# Patient Record
Sex: Female | Born: 1985 | Race: Black or African American | Hispanic: No | Marital: Single | Smoking: Never smoker
Health system: Southern US, Community
[De-identification: ages and names within clinical notes are randomized; demographics above are authoritative.]

## PROBLEM LIST (undated history)

## (undated) DIAGNOSIS — I2699 Other pulmonary embolism without acute cor pulmonale: Secondary | ICD-10-CM

## (undated) DIAGNOSIS — F329 Major depressive disorder, single episode, unspecified: Secondary | ICD-10-CM

## (undated) DIAGNOSIS — G2119 Other drug induced secondary parkinsonism: Secondary | ICD-10-CM

## (undated) DIAGNOSIS — F209 Schizophrenia, unspecified: Secondary | ICD-10-CM

## (undated) DIAGNOSIS — G8190 Hemiplegia, unspecified affecting unspecified side: Secondary | ICD-10-CM

## (undated) DIAGNOSIS — E538 Deficiency of other specified B group vitamins: Secondary | ICD-10-CM

## (undated) DIAGNOSIS — F32A Depression, unspecified: Secondary | ICD-10-CM

## (undated) DIAGNOSIS — H2010 Chronic iridocyclitis, unspecified eye: Secondary | ICD-10-CM

## (undated) DIAGNOSIS — K219 Gastro-esophageal reflux disease without esophagitis: Secondary | ICD-10-CM

## (undated) DIAGNOSIS — G809 Cerebral palsy, unspecified: Secondary | ICD-10-CM

## (undated) DIAGNOSIS — M1990 Unspecified osteoarthritis, unspecified site: Secondary | ICD-10-CM

## (undated) DIAGNOSIS — H409 Unspecified glaucoma: Secondary | ICD-10-CM

## (undated) HISTORY — PX: CSF SHUNT: SHX92

## (undated) HISTORY — DX: Major depressive disorder, single episode, unspecified: F32.9

## (undated) HISTORY — PX: IVC FILTER INSERTION: CATH118245

## (undated) HISTORY — DX: Schizophrenia, unspecified: F20.9

## (undated) HISTORY — DX: Depression, unspecified: F32.A

## (undated) HISTORY — DX: Cerebral palsy, unspecified: G80.9

## (undated) HISTORY — DX: Hemiplegia, unspecified affecting unspecified side: G81.90

## (undated) HISTORY — DX: Other drug induced secondary parkinsonism: G21.19

---

## 2004-11-10 ENCOUNTER — Ambulatory Visit: Payer: Self-pay | Admitting: Internal Medicine

## 2004-12-03 ENCOUNTER — Ambulatory Visit: Payer: Self-pay | Admitting: Internal Medicine

## 2006-11-07 ENCOUNTER — Other Ambulatory Visit: Payer: Self-pay

## 2006-11-07 ENCOUNTER — Emergency Department: Payer: Self-pay | Admitting: Emergency Medicine

## 2010-01-31 ENCOUNTER — Emergency Department: Payer: Self-pay | Admitting: Internal Medicine

## 2010-02-13 ENCOUNTER — Emergency Department: Payer: Self-pay | Admitting: Emergency Medicine

## 2011-08-14 ENCOUNTER — Inpatient Hospital Stay: Payer: Self-pay | Admitting: Psychiatry

## 2013-01-08 ENCOUNTER — Emergency Department: Payer: Self-pay | Admitting: Emergency Medicine

## 2013-01-08 LAB — URINALYSIS, COMPLETE
Bilirubin,UR: NEGATIVE
Glucose,UR: NEGATIVE mg/dL (ref 0–75)
Ketone: NEGATIVE
Nitrite: NEGATIVE
Ph: 6 (ref 4.5–8.0)
RBC,UR: 9 /HPF (ref 0–5)
Specific Gravity: 1.019 (ref 1.003–1.030)
WBC UR: 77 /HPF (ref 0–5)

## 2013-01-08 LAB — COMPREHENSIVE METABOLIC PANEL
Alkaline Phosphatase: 88 U/L (ref 50–136)
Anion Gap: 8 (ref 7–16)
Bilirubin,Total: 0.6 mg/dL (ref 0.2–1.0)
Calcium, Total: 9.6 mg/dL (ref 8.5–10.1)
Chloride: 108 mmol/L — ABNORMAL HIGH (ref 98–107)
Creatinine: 0.66 mg/dL (ref 0.60–1.30)
EGFR (African American): >60
EGFR (Non-African Amer.): >60
Osmolality: 275 (ref 275–301)
Potassium: 4 mmol/L (ref 3.5–5.1)
SGPT (ALT): 19 U/L (ref 12–78)
Total Protein: 9.1 g/dL — ABNORMAL HIGH (ref 6.4–8.2)

## 2013-01-08 LAB — CBC
HGB: 14.1 g/dL (ref 12.0–16.0)
MCH: 28.6 pg (ref 26.0–34.0)
MCHC: 33.6 g/dL (ref 32.0–36.0)
MCV: 85 fL (ref 80–100)
RBC: 4.95 10*6/uL (ref 3.80–5.20)
RDW: 13.8 % (ref 11.5–14.5)
WBC: 10.1 10*3/uL (ref 3.6–11.0)

## 2013-06-13 ENCOUNTER — Ambulatory Visit: Payer: Self-pay | Admitting: Neurology

## 2013-07-08 LAB — COMPREHENSIVE METABOLIC PANEL
Alkaline Phosphatase: 78 U/L (ref 50–136)
BUN: 5 mg/dL — ABNORMAL LOW (ref 7–18)
Calcium, Total: 9 mg/dL (ref 8.5–10.1)
Chloride: 106 mmol/L (ref 98–107)
Creatinine: 0.72 mg/dL (ref 0.60–1.30)
Potassium: 3.5 mmol/L (ref 3.5–5.1)
SGPT (ALT): 14 U/L (ref 12–78)
Total Protein: 8.1 g/dL (ref 6.4–8.2)

## 2013-07-08 LAB — CBC
HGB: 13.1 g/dL (ref 12.0–16.0)
MCH: 28.4 pg (ref 26.0–34.0)
MCV: 85 fL (ref 80–100)
Platelet: 217 10*3/uL (ref 150–440)
RBC: 4.61 10*6/uL (ref 3.80–5.20)
RDW: 13.6 % (ref 11.5–14.5)
WBC: 13.1 10*3/uL — ABNORMAL HIGH (ref 3.6–11.0)

## 2013-07-08 LAB — LIPASE, BLOOD: Lipase: 117 U/L (ref 73–393)

## 2013-07-09 ENCOUNTER — Inpatient Hospital Stay: Payer: Self-pay | Admitting: Internal Medicine

## 2013-07-09 LAB — URINALYSIS, COMPLETE
Bilirubin,UR: NEGATIVE
Blood: NEGATIVE
Glucose,UR: NEGATIVE mg/dL (ref 0–75)
Leukocyte Esterase: NEGATIVE
Ph: 6 (ref 4.5–8.0)
Protein: 30
Specific Gravity: 1.027 (ref 1.003–1.030)
Squamous Epithelial: <1
WBC UR: 3 /HPF (ref 0–5)

## 2013-07-09 LAB — PROTIME-INR: Prothrombin Time: 14.9 secs — ABNORMAL HIGH (ref 11.5–14.7)

## 2013-07-09 LAB — APTT: Activated PTT: 32.9 secs (ref 23.6–35.9)

## 2013-07-10 LAB — CBC WITH DIFFERENTIAL/PLATELET
Basophil %: 0.5 %
Eosinophil %: 0.5 %
HCT: 34.8 % — ABNORMAL LOW (ref 35.0–47.0)
HGB: 12.2 g/dL (ref 12.0–16.0)
Lymphocyte %: 19.5 %
MCHC: 34.9 g/dL (ref 32.0–36.0)
Monocyte %: 10 %
Neutrophil #: 6.8 10*3/uL — ABNORMAL HIGH (ref 1.4–6.5)
Platelet: 190 10*3/uL (ref 150–440)
RBC: 4.19 10*6/uL (ref 3.80–5.20)
WBC: 9.8 10*3/uL (ref 3.6–11.0)

## 2013-07-10 LAB — BASIC METABOLIC PANEL
Anion Gap: 7 (ref 7–16)
BUN: 2 mg/dL — ABNORMAL LOW (ref 7–18)
Calcium, Total: 9 mg/dL (ref 8.5–10.1)
Co2: 24 mmol/L (ref 21–32)
EGFR (African American): >60
Sodium: 138 mmol/L (ref 136–145)

## 2013-07-10 LAB — APTT: Activated PTT: 67.9 secs — ABNORMAL HIGH (ref 23.6–35.9)

## 2013-07-10 LAB — PROTIME-INR
INR: 1.3
Prothrombin Time: 16.5 secs — ABNORMAL HIGH (ref 11.5–14.7)

## 2013-07-11 LAB — CBC WITH DIFFERENTIAL/PLATELET
Basophil #: 0.1 10*3/uL (ref 0.0–0.1)
Eosinophil #: 0 10*3/uL (ref 0.0–0.7)
Eosinophil %: 0.4 %
HGB: 12.6 g/dL (ref 12.0–16.0)
Lymphocyte #: 1.9 10*3/uL (ref 1.0–3.6)
MCH: 28.9 pg (ref 26.0–34.0)
MCHC: 34.2 g/dL (ref 32.0–36.0)
MCV: 85 fL (ref 80–100)
Neutrophil #: 8.6 10*3/uL — ABNORMAL HIGH (ref 1.4–6.5)
Platelet: 228 10*3/uL (ref 150–440)

## 2013-07-11 LAB — APTT
Activated PTT: 57.3 secs — ABNORMAL HIGH (ref 23.6–35.9)
Activated PTT: 80.3 secs — ABNORMAL HIGH (ref 23.6–35.9)

## 2013-07-11 LAB — PROTIME-INR
INR: 1.9
Prothrombin Time: 21.3 secs — ABNORMAL HIGH (ref 11.5–14.7)

## 2013-07-12 ENCOUNTER — Ambulatory Visit: Payer: Self-pay | Admitting: Internal Medicine

## 2013-07-12 LAB — BASIC METABOLIC PANEL
Anion Gap: 6 — ABNORMAL LOW (ref 7–16)
Chloride: 102 mmol/L (ref 98–107)
Co2: 27 mmol/L (ref 21–32)
EGFR (Non-African Amer.): >60
Glucose: 94 mg/dL (ref 65–99)
Osmolality: 266 (ref 275–301)
Potassium: 3.6 mmol/L (ref 3.5–5.1)
Sodium: 135 mmol/L — ABNORMAL LOW (ref 136–145)

## 2013-07-12 LAB — CBC WITH DIFFERENTIAL/PLATELET
Basophil #: 0.1 10*3/uL (ref 0.0–0.1)
Basophil #: 0 10*3/uL (ref 0.0–0.1)
Basophil %: 0.7 %
Eosinophil #: 0 10*3/uL (ref 0.0–0.7)
HCT: 35.6 % (ref 35.0–47.0)
HGB: 12.5 g/dL (ref 12.0–16.0)
Lymphocyte #: 1.6 10*3/uL (ref 1.0–3.6)
Lymphocyte #: 1.2 10*3/uL (ref 1.0–3.6)
MCH: 29.1 pg (ref 26.0–34.0)
MCHC: 34.6 g/dL (ref 32.0–36.0)
MCHC: 35.1 g/dL (ref 32.0–36.0)
MCV: 83 fL (ref 80–100)
Monocyte #: 1.1 x10 3/mm — ABNORMAL HIGH (ref 0.2–0.9)
Monocyte %: 11.3 %
Neutrophil #: 7.2 10*3/uL — ABNORMAL HIGH (ref 1.4–6.5)
Neutrophil %: 75.7 %
Platelet: 272 10*3/uL (ref 150–440)
RBC: 4.3 10*6/uL (ref 3.80–5.20)
RDW: 13.8 % (ref 11.5–14.5)
WBC: 9.9 10*3/uL (ref 3.6–11.0)

## 2013-07-12 LAB — APTT
Activated PTT: 85.3 secs — ABNORMAL HIGH (ref 23.6–35.9)
Activated PTT: 47.8 secs — ABNORMAL HIGH (ref 23.6–35.9)
Activated PTT: 72.5 secs — ABNORMAL HIGH (ref 23.6–35.9)

## 2013-07-12 LAB — PROTIME-INR
INR: 2.7
Prothrombin Time: 29.5 secs — ABNORMAL HIGH (ref 11.5–14.7)

## 2013-07-13 LAB — CBC WITH DIFFERENTIAL/PLATELET
Basophil %: 1 %
Eosinophil #: 0.1 10*3/uL (ref 0.0–0.7)
Eosinophil %: 1.2 %
HCT: 36.1 % (ref 35.0–47.0)
HGB: 12.7 g/dL (ref 12.0–16.0)
Lymphocyte #: 2.6 10*3/uL (ref 1.0–3.6)
Lymphocyte %: 29.4 %
MCH: 29 pg (ref 26.0–34.0)
Monocyte #: 1.1 x10 3/mm — ABNORMAL HIGH (ref 0.2–0.9)
Monocyte %: 12 %
Neutrophil #: 4.9 10*3/uL (ref 1.4–6.5)
Neutrophil %: 56.4 %
RBC: 4.37 10*6/uL (ref 3.80–5.20)
RDW: 13.3 % (ref 11.5–14.5)
WBC: 8.8 10*3/uL (ref 3.6–11.0)

## 2013-07-13 LAB — PROTIME-INR
INR: 2.5
Prothrombin Time: 30.8 secs — ABNORMAL HIGH (ref 11.5–14.7)
Prothrombin Time: 26.7 secs — ABNORMAL HIGH (ref 11.5–14.7)

## 2013-07-14 ENCOUNTER — Other Ambulatory Visit: Payer: Self-pay | Admitting: Internal Medicine

## 2013-07-14 LAB — PROTIME-INR
INR: 3.5
Prothrombin Time: 33.7 secs — ABNORMAL HIGH (ref 11.5–14.7)

## 2013-07-16 ENCOUNTER — Ambulatory Visit: Payer: Self-pay | Admitting: Internal Medicine

## 2013-07-16 LAB — PROTIME-INR
INR: 1.9
Prothrombin Time: 21.7 secs — ABNORMAL HIGH (ref 11.5–14.7)

## 2013-07-20 LAB — PROTIME-INR: INR: 2.4

## 2013-07-22 ENCOUNTER — Other Ambulatory Visit: Payer: Self-pay | Admitting: Internal Medicine

## 2013-07-22 LAB — PROTIME-INR
INR: 2.1
Prothrombin Time: 23.5 secs — ABNORMAL HIGH (ref 11.5–14.7)

## 2013-07-23 ENCOUNTER — Ambulatory Visit: Payer: Self-pay | Admitting: Internal Medicine

## 2013-07-24 LAB — PROTIME-INR: INR: 2.1

## 2013-07-30 LAB — PROTIME-INR
INR: 2.2
Prothrombin Time: 24.1 secs — ABNORMAL HIGH (ref 11.5–14.7)

## 2013-08-06 LAB — PROTIME-INR: INR: 1.7

## 2013-08-07 LAB — CA 125: CA 125: 22.6 U/mL (ref 0.0–34.0)

## 2013-08-07 LAB — CEA: CEA: 1.6 ng/mL (ref 0.0–4.7)

## 2013-08-20 LAB — PROTIME-INR: INR: 2.2

## 2013-08-22 ENCOUNTER — Ambulatory Visit: Payer: Self-pay | Admitting: Internal Medicine

## 2013-08-29 LAB — PROTIME-INR: Prothrombin Time: 16.2 secs — ABNORMAL HIGH (ref 11.5–14.7)

## 2013-09-12 LAB — PROTIME-INR
INR: 1.9
Prothrombin Time: 21.4 secs — ABNORMAL HIGH (ref 11.5–14.7)

## 2013-09-22 ENCOUNTER — Ambulatory Visit: Payer: Self-pay | Admitting: Internal Medicine

## 2013-09-26 LAB — PROTIME-INR: Prothrombin Time: 20.2 secs — ABNORMAL HIGH (ref 11.5–14.7)

## 2013-10-03 LAB — PROTIME-INR
INR: 1.7
Prothrombin Time: 20.2 secs — ABNORMAL HIGH (ref 11.5–14.7)

## 2013-10-08 LAB — PROTIME-INR
INR: 2
Prothrombin Time: 22.3 secs — ABNORMAL HIGH (ref 11.5–14.7)

## 2013-10-15 LAB — PROTIME-INR: INR: 1.8

## 2013-10-22 ENCOUNTER — Ambulatory Visit: Payer: Self-pay | Admitting: Internal Medicine

## 2013-10-25 ENCOUNTER — Ambulatory Visit: Payer: Self-pay | Admitting: Internal Medicine

## 2013-10-25 LAB — PROTIME-INR: Prothrombin Time: 19.3 secs — ABNORMAL HIGH (ref 11.5–14.7)

## 2013-11-05 LAB — PROTIME-INR: Prothrombin Time: 26.9 secs — ABNORMAL HIGH (ref 11.5–14.7)

## 2013-11-12 LAB — PROTIME-INR
INR: 2.7
Prothrombin Time: 27.6 secs — ABNORMAL HIGH (ref 11.5–14.7)

## 2013-11-22 ENCOUNTER — Ambulatory Visit: Payer: Self-pay | Admitting: Internal Medicine

## 2013-11-27 LAB — PROTIME-INR
INR: 1.4
Prothrombin Time: 16.6 secs — ABNORMAL HIGH (ref 11.5–14.7)

## 2013-12-05 LAB — PROTIME-INR
INR: 1.7
Prothrombin Time: 19.3 secs — ABNORMAL HIGH (ref 11.5–14.7)

## 2013-12-10 LAB — PROTIME-INR
INR: 1.3
Prothrombin Time: 16.3 secs — ABNORMAL HIGH (ref 11.5–14.7)

## 2013-12-17 LAB — PROTIME-INR
INR: 1.7
Prothrombin Time: 19.6 secs — ABNORMAL HIGH (ref 11.5–14.7)

## 2013-12-18 LAB — PROT IMMUNOELECTROPHORES(ARMC)

## 2013-12-23 ENCOUNTER — Ambulatory Visit: Payer: Self-pay | Admitting: Internal Medicine

## 2013-12-31 LAB — URINALYSIS, COMPLETE
Bilirubin,UR: NEGATIVE
Blood: NEGATIVE
GLUCOSE, UR: NEGATIVE mg/dL (ref 0–75)
KETONE: NEGATIVE
Nitrite: NEGATIVE
PH: 6 (ref 4.5–8.0)
Protein: NEGATIVE
RBC,UR: 1 /HPF (ref 0–5)
Specific Gravity: 1.025 (ref 1.003–1.030)
Squamous Epithelial: 1
WBC UR: 23 /HPF (ref 0–5)

## 2013-12-31 LAB — PROTIME-INR
INR: 1.6
Prothrombin Time: 18.5 secs — ABNORMAL HIGH (ref 11.5–14.7)

## 2014-01-14 ENCOUNTER — Ambulatory Visit: Payer: Self-pay | Admitting: Neurology

## 2014-01-14 LAB — HCG, QUANTITATIVE, PREGNANCY: Beta Hcg, Quant.: <1 m[IU]/mL — ABNORMAL LOW

## 2014-01-20 ENCOUNTER — Ambulatory Visit: Payer: Self-pay | Admitting: Internal Medicine

## 2014-02-20 ENCOUNTER — Ambulatory Visit: Payer: Self-pay | Admitting: Internal Medicine

## 2014-02-25 LAB — D-DIMER(ARMC): D-DIMER: 624 ng/mL

## 2014-03-22 ENCOUNTER — Ambulatory Visit: Payer: Self-pay | Admitting: Internal Medicine

## 2015-01-03 ENCOUNTER — Emergency Department: Payer: Self-pay | Admitting: Emergency Medicine

## 2015-01-25 ENCOUNTER — Emergency Department: Payer: Self-pay | Admitting: Emergency Medicine

## 2015-01-28 ENCOUNTER — Emergency Department: Payer: Self-pay | Admitting: Emergency Medicine

## 2015-03-14 NOTE — Discharge Summary (Signed)
PATIENT NAME:  Hannah Kennedy, Hannah Kennedy MR#:  [id number] DATE OF BIRTH:  September 15, 1986  DATE OF ADMISSION:  07/09/2013 DATE OF DISCHARGE:  07/13/2013  ADMITTING DIAGNOSES:  1. Abdominal pain.  2. Tachycardia.   DISCHARGE DIAGNOSES:  1. Abdominal pain, felt to be due to likely pleurisy from bilateral pulmonary embolism.  2. Bilateral pulmonary embolism, possibly related to hypercoagulable state as well as limited mobility.  3. Possible uterine mass noted on CT scan. Negative ultrasound for any mass. Status post gynecology evaluation.  4. Decrease in protein C level as well as antithrombin antigen level, status post hematology evaluation and followup.   CONSULTANTS: Dr. Vena AustriaAndreas Staebler as well as Dr. Lorre NickGittin.  PERTINENT LABORATORY EVALUATIONS: INR was 1.2 on admission. Lipase was 117. Glucose 132, BUN 5, creatinine 0.72, sodium 136, potassium 3.5, chloride 106, CO2 was 24, calcium was 9, bilirubin total 0.4, alkaline phosphatase was 78, ALT was 14, AST was 14, total protein 8.1, albumin 3.8. WBC 13.1, hemoglobin 13.1, platelet count was 217. EKG showed sinus tachycardia. CT of the abdomen and pelvis without contrast showed bilateral pulmonary emboli with filling defects. Right pelvic enhancing mass. Ultrasound of the bilateral lower extremities was negative. Antithrombin III antigen was less than 70%. Factor V Leiden mutation was no mutation found. Pelvic ultrasound showed no evidence of any mass. No lupus anticoagulant detected. The patient's INR today is 2.5.   HOSPITAL COURSE: Please refer to H and P done by the admitting physician. The patient is a 29 year old female with a history of multiple sclerosis, has mild mental retardation, history of hydrocephalus and cerebral palsy, who was brought into the ED with abdominal pain. The patient underwent evaluation for her abdominal pain. She was also tachycardic on presentation. She had a CT scan of the abdomen and pelvis done which showed bilateral lower lobe  pulmonary emboli were noted on the CT scan. Therefore, the patient was admitted for bilateral pulmonary emboli. The likely cause of her abdominal pain was pleuritic pain from these PEs. The patient was started on heparin drip. She also had a Doppler of her lower extremity, which was negative. The patient was also concurrently started on Coumadin. The patient was noted to have a low protein C level as well as antithrombin antigen level. The patient was continued on Coumadin until her INR became therapeutic. The patient was on heparin for 5 days, and for 2 days, her INR stayed above 2. She was seen in consultation by Dr. Lorre NickGittin, who agreed with her plan, and he recommended the patient can be discharged and be followed up as an outpatient. At this time, she is doing much better and is stable for discharge.   DISCHARGE MEDICATIONS:  1. Acetaminophen/oxycodone 325/5 q.8 p.r.n. 2. Coumadin 4 mg daily. 3. Colace 100 mg 1 tab p.o. b.i.Kennedy. p.r.n. constipation.   DIET: Regular.   ACTIVITY: As tolerated.   FOLLOWUP:  1. [facility] at 1:30 p.m. for INR check.  2. The patient to follow up with Dr. Lorre NickGittin as scheduled.   TIME SPENT: Note, 35 minutes spent on the discharge.   ____________________________ Lacie ScottsShreyang H. Allena KatzPatel, MD shp:OSi Kennedy: 07/14/2013 08:29:08 ET T: 07/14/2013 08:50:55 ET JOB#: [id number]  cc: Kasiyah Platter H. Allena KatzPatel, MD, <Dictator> Charise CarwinSHREYANG H Keiarah Orlowski MD ELECTRONICALLY SIGNED 07/17/2013 18:23

## 2015-03-14 NOTE — Consult Note (Signed)
Brief Consult Note: Diagnosis: pulm embolism.   Patient was seen by consultant.   Comments: DICTATED NOTE TO FOLLOW, PATIENT SEEN, CHART REVIEWED LAST NIGHT, F/U THIS PM, HX AS NOTED ABDO PAIN NOW ATTRIBUTED TO PE/PLEURISY, DOPPLERS LEGS NEG, NO PRECIPITATING EVENT, PATIENT TAKES DEPO PROVERA..SOME HYPERCOAG STUDIES SENT,.. AT3 LOW WHICH MAY BE SPURRIOUS DUE TO ACUTE CLOT,  PROT C IS LOW WHICH MAY BE REAL AND THE CAAUSE OF CLOT, LUPUS PANEL AND OTHER STUDIES PENDING..CT AND PELVIC U/S NEGATIVE FOR CLOT.   PLAN REVIEW LUPUS PANEL, IF NEGATIVE ADD CARDIOLIPIN AND GLYCOPROTEIN ANTIBODIES, CHECK FACTOR 5 LIDEN AND PROTHROMBIN MUTATION, CHECK U/A TO R/O PROTEINURIA.  RE ANTICOAGULATION, COUMADIN STARTED 8/18, AND GIVEN 8/19 AND 8/20.Marland Kitchen.Marland Kitchen.INR UP , BUT PARTIALLY DUE TO HEPARIN EFFECT. NEED TO BE ON COUMADIN FOR 5 DAYS BEFORE HEPSRIN CAN BE STOPPED,  IF INR IS THERAPEUTIC THEN OVERLAP COUMADIN AND HEPARIN, IF INR IS SUPRATHERAPEUTIC, HOLD HEPARIN X  4 HRS AND RECHECK, KEEP HEPARIN IN NLOWER THERAPEUTIC RANGE. I WILL F/U LATER TODAY,  MAY GIVE COUMADIN LATER TODAY, I WILL F/U IN AM.  DISCUSSED WITH DR PATEL, EXPLAINED TO PATIENT AND FAMILY...DICTATED NOTE TO FOLLOW.  Electronic Signatures: Marin RobertsGittin, Robert G (MD)  (Signed 21-Aug-14 14:08)  Authored: Brief Consult Note   Last Updated: 21-Aug-14 14:08 by Marin RobertsGittin, Robert G (MD)

## 2015-03-14 NOTE — Consult Note (Signed)
Chief Complaint:  Subjective/Chief Complaint no sob no acute complaints   VITAL SIGNS/ANCILLARY NOTES:  **Vital Signs.:   22-Aug-14 04:09  Vital Signs Type Routine  Pulse Pulse 112  Respirations Respirations 16  Systolic BP Systolic BP 106  Diastolic BP (mmHg) Diastolic BP (mmHg) 64  Mean BP 78  Pulse Ox % Pulse Ox % 96  Pulse Ox Activity Level  At rest  Oxygen Delivery Room Air/ 21 %    15:38  Vital Signs Type Routine  Temperature Temperature (F) 98.2  Celsius 36.7  Temperature Source oral  Pulse Pulse 114  Respirations Respirations 20  Systolic BP Systolic BP 105  Diastolic BP (mmHg) Diastolic BP (mmHg) 73  Mean BP 83  Pulse Ox % Pulse Ox % 99  Pulse Ox Activity Level  At rest  Oxygen Delivery Room Air/ 21 %   Brief Assessment:  Additional Physical Exam alert, cooperative, no bruising, old right arm and leg weakness no change   Lab Results:  Routine Coag:  22-Aug-14 04:10   Prothrombin  30.8  INR 3.1 (INR reference interval applies to patients on anticoagulant therapy. A single INR therapeutic range for coumarins is not optimal for all indications; however, the suggested range for most indications is 2.0 - 3.0. Exceptions to the INR Reference Range may include: Prosthetic heart valves, acute myocardial infarction, prevention of myocardial infarction, and combinations of aspirin and anticoagulant. The need for a higher or lower target INR must be assessed individually. Reference: The Pharmacology and Management of the Vitamin K  antagonists: the seventh ACCP Conference on Antithrombotic and Thrombolytic Therapy. Chest.2004 Sept:126 (3suppl): L7870634. A HCT value >55% may artifactually increase the PT.  In one study,  the increase was an average of 25%. Reference:  "Effect on Routine and Special Coagulation Testing Values of Citrate Anticoagulant Adjustment in Patients with High HCT Values." American Journal of Clinical Pathology 2006;126:400-405.)  Activated  PTT (APTT)  99.5 (A HCT value >55% may artifactually increase the APTT. In one study, the increase was an average of 19%. Reference: "Effect on Routine and Special Coagulation Testing Values of Citrate Anticoagulant Adjustment in Patients with High HCT Values." American Journal of Clinical Pathology 2006;126:400-405.)    13:20   Prothrombin  26.7  INR 2.5 (INR reference interval applies to patients on anticoagulant therapy. A single INR therapeutic range for coumarins is not optimal for all indications; however, the suggested range for most indications is 2.0 - 3.0. Exceptions to the INR Reference Range may include: Prosthetic heart valves, acute myocardial infarction, prevention of myocardial infarction, and combinations of aspirin and anticoagulant. The need for a higher or lower target INR must be assessed individually. Reference: The Pharmacology and Management of the Vitamin K  antagonists: the seventh ACCP Conference on Antithrombotic and Thrombolytic Therapy. Chest.2004 Sept:126 (3suppl): L7870634. A HCT value >55% may artifactually increase the PT.  In one study,  the increase was an average of 25%. Reference:  "Effect on Routine and Special Coagulation Testing Values of Citrate Anticoagulant Adjustment in Patients with High HCT Values." American Journal of Clinical Pathology 2006;126:400-405.)  Routine Hem:  22-Aug-14 04:10   WBC (CBC) 8.8  RBC (CBC) 4.37  Hemoglobin (CBC) 12.7  Hematocrit (CBC) 36.1  Platelet Count (CBC) 279  MCV 83  MCH 29.0  MCHC 35.1  RDW 13.3  Neutrophil % 56.4  Lymphocyte % 29.4  Monocyte % 12.0  Eosinophil % 1.2  Basophil % 1.0  Neutrophil # 4.9  Lymphocyte # 2.6  Monocyte #  1.1  Eosinophil # 0.1  Basophil # 0.1 (Result(s) reported on 13 Jul 2013 at 04:47AM.)   Assessment/Plan:  Assessment/Plan:  Assessment PTT UP,  INR 3.1     PLAN  OK TO STOP HEPARIN DRIP NOW, WILL RECHECK INR IN 4 HRS OFF HEPARIN, WILL DOSE TODAYS COUMADIN  ACCORDINGLY, THE PLANNED 2MG  LOOKS LLIKE IT WILL BE THE BEST DOSE...LIKELY WILL BE OK TO GO HOME LATER TODAY, WITH PROPHYLACTIC DOSE 30MG  LOVENOX PRIOR TO D/C, AND ARRANGEMENTS FOR PROTIME CHECK [facility] OVER WEEKEND, ONCOLOGY MD ON CALL CAN CHECK AND TITRATE COUMADIN....I WILL F/U THIS AFTERNOON.......addendum, f/u now 1630 hrs...repeat protime off heparin is 2.5.   ok for 4 mg coumadin po today and daily.  will give subcut lovenox prophylactic dose x 1 prior to discharge.  patient arranged for out patient protime tomorrow DR PANDIT to review and adjust prn,  then f/u in cancer center 230 pm monday 8/25   Electronic Signatures: Marin RobertsGittin, Robert G (MD)  (Signed 22-Aug-14 16:23)  Authored: Chief Complaint, VITAL SIGNS/ANCILLARY NOTES, Brief Assessment, Lab Results, Assessment/Plan   Last Updated: 22-Aug-14 16:23 by Marin RobertsGittin, Robert G (MD)

## 2015-03-14 NOTE — Consult Note (Signed)
PATIENT NAME:  Hannah Kennedy, Hannah Kennedy MR#:  [id number] DATE OF BIRTH:  [birth year]  HEMATOLOGY CONSULTATION  DATE OF CONSULTATION:  07/12/2013  HISTORY OF PRESENT ILLNESS: Ms Clementeen GrahamCurry is a 29 year old patient who was admitted on August 18th and was seen and evaluated by me on August 21st. A note was placed in the chart on that day. Discussed all the issues with the family members. This full narrative was delayed until the current day.   The patient was admitted, see also admission history and physical for details, with a few days of abdominal pain that radiated to the lower abdomen without any fever or other accompanying symptoms. 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 There was some shortness of breath and the pain waxed and waned, later was attributed to pleurisy.   Pelvic ultrasound with transvaginal was done and there was no adnexal or uterine pathology. The patient was put on pain medicines and oxygen initially. Lower extremity Dopplers were performed, and they were negative. She was on a heparin drip and Coumadin was started. She was given GI prophylaxis with Protonix.   Hematology was consulted and I saw the patient on August 21st.  It was at that point the patient was doing well. Abdominal pain was minimal. Workup to-date had been performed,  antithrombin levels have been shown to be low, as likely spurious, protein C level was shown to be low which may be the cause of the clot. Other studies, including lupus panel, were pending. As already stated,  CT of the abdomen and pelvis had already been performed. Coumadin had been started on August 18th, and the INR was already fairly high after only 3 doses. Some of this was due to a heparin effect.   PAST MEDICAL HISTORY: Cerebral palsy, with mild mental retardation and some psychosis, hydrocephalus as a child, seizures as a child, question of  valvular heart disease. Had a VP shunt as a neonate.   SOCIAL HISTORY: Negative for alcohol or tobacco. The patient lives with her grandmother.   FAMILY HISTORY: Negative for heart disease or blood clots.   ALLERGIES: No known allergies.   MEDICATIONS: Had been on intramuscular Invega for psychosis, and intramuscular Depo-Provera every 3 months.   REVIEW OF SYSTEMS: When I saw the patient, she was comfortable. Was still having fleeting abdominal pain and some shortness of breath, but had no fever, chills, headache or dizziness. No ear or jaw pain. No stomatitis. No cough, wheezing, hemoptysis. No palpitations or retrosternal pain. No nausea, vomiting, diarrhea, history of blood in stools. No dysuria or hematuria or polyuria, polydipsia. No history of anemia, easy bleeding or bruising. No numbness or tingling of the extremities. There is a history of weakness, right arm greater than right leg, and chronically the patient walks with a limp. There is a history of psychosis. No history of depression.   PHYSICAL EXAMINATION: GENERAL: The patient was alert and cooperative in no acute distress. There was no jaundice. HEENT: Sclerae were clear.  LYMPH: No lymph nodes in the neck. No palpable nodes in the supraclavicular, submandibular, or axilla.  ABDOMEN: Abdomen was not tender to my exam.  HEART: Was regular. There was no extremity edema. There was some contraction of the right arm and at the right wrist, and there was focal weakness in the right arm and to a lesser extent the right leg, which  is per patient and the family members, which is old and stable. The patient was alert and cooperative.   LABS/STUDIES: Creatinine was 0.72. Liver functions were normal. The white count was  13.1, hemoglobin was 13.1. The platelets were 217, and baseline INR was 1.2.   IMPRESSION AND PLAN: The patient, as stated, had a questionable uterine mass that initially, on ultrasound, was benign, and this history of  psychosis, which is stable, and underlying neurologic issues, as reviewed, currently stable. Pulmonary embolism without any lower extremity edema, without any precipitating cause. Initial workup had shown a low protein C could be the cause.   PLAN: Complete hypercoagulable workup and later recheck the protein C. Monitor and titrate the Coumadin and make sure the Coumadin is over level of heparin for at least 5 days. Later, adjust Coumadin as an outpatient and later address the length of anticoagulation, depending on the workup.  \ Stopped Depo-Provera and need to later address birth control issues with primary care or gynecology.   Would follow up with CBC. Later would add tumor markers as a screening. Otherwise, no other age-appropriate cancer screening in this patient.   Followup in the Cancer Center at the time of discharge. I had planned to follow daily while the patient was in the hospital.      ____________________________ Knute Neu. Lorre Nick, MD rgg:dm Kennedy: 07/19/2013 13:38:00 ET T: 07/19/2013 14:15:02 ET JOB#: [id number]  cc: Knute Neu. Lorre Nick, MD, <Dictator> Marin Roberts MD ELECTRONICALLY SIGNED 08/22/2013 11:22

## 2015-03-14 NOTE — H&P (Signed)
PATIENT NAME:  Hannah Kennedy, Hannah Kennedy MR#:  [id number] DATE OF BIRTH:  [birth year]  DATE OF ADMISSION:  07/09/2013  REFERRING PHYSICIAN: Lurline Hare, MD  PRIMARY CARE PHYSICIAN: [facility]   CHIEF COMPLAINT: Abdominal pain.   HISTORY OF PRESENT ILLNESS: This is a 29 year old female with significant past medical history of multiple sclerosis not on any treatment currently, mild mental retardation, hydrocephalus, cerebral palsy and psychosis who presents with complaints of abdominal pain. The patient reports her symptoms have been going on for the last day. Reports it is in the right lower quadrant area, sharp, moderate, constant. She denies any fever, chills, nausea, vomiting, diarrhea, sweating or loss of appetite. The patient had a CT of abdomen and pelvis with IV contrast for evaluation. The patient was found to have pelvic mass, in the uterus, most likely leiomyoma, but as well in the findings the patient was found to have bilateral lower lobe pulmonary embolism with possible right lobe developing infarction, in the right lung base. The patient denies any shortness of breath. Reports cough but denies any hemoptysis.  Denies any chest pain or any pleuritic chest pain. As well, denies any recent history of flying or prolonged travel. Reports she ambulatory. Denies any lower extremity erythema, pain or swelling. Denies any hemoptysis. The patient was noticed to be tachycardic, but did not have any hypoxia.  The patient was started on heparin drip in the ED. The patient reports she is using IM Depo every 3 months for contraception. As well, she is using IM Invega, cycles every 4 weeks, where she is due on this coming Tuesday. As well, she denies any family history of any blood clots in the family. Hospitalist service was requested to admit the patient for further evaluation of uterine mass. As well, for further management of her PE.   PAST MEDICAL HISTORY: 1.  Multiple sclerosis.  2.  Mild mental  retardation. 3.  Hydrocephalus.  4.  Leaky valves.  5.  Seizures as a child.  5.  Psychosis.  6.  Cerebral palsy.   PAST SURGICAL HISTORY: VP shunt as neonate.   SOCIAL HISTORY: The patient lives with her grandmother. No history of smoking, illicit drug use or alcohol.   FAMILY HISTORY: No family history of coronary artery disease at a young age. No family history of any hypercoagulable disease or blood clots.   ALLERGIES: No known drug allergies.  HOME MEDICATIONS: The patient is on: 1.  IM Depo every 3 months for contraception. Does not recall which type exactly.  2.  The patient is on IM Invega Sustenna 234 mg IM monthly. She is due next Tuesday.  REVIEW OF SYSTEMS: CONSTITUTIONAL: Denies fever, chills, fatigue, weakness, weight gain, weight loss.  EYES: Denies blurry vision, double vision, pain, inflammation, glaucoma.  ENT: Denies tinnitus, ear pain, hearing loss, epistaxis or discharge.  RESPIRATORY: Denies wheezing, hemoptysis, dyspnea, painful respirations, COPD.  Complains of dry cough.  CARDIOVASCULAR: Denies chest pain, edema, palpitations, syncope.  GASTROINTESTINAL: Denies nausea, vomiting, diarrhea, hematemesis, melena. Complains of abdominal pain.  GENITOURINARY: Denies dysuria, hematuria, renal colic.  ENDOCRINE: Denies polyuria, polydipsia, heat or cold intolerance.  HEMATOLOGY: Denies any anemia, easy bruising, bleeding diathesis. INTEGUMENTARY: Denies acne, rash or skin lesions. MUSCULOSKELETAL: Denies any arthritis, cramps, swelling, gout.  NEUROLOGIC: Denies any history of numbness, tremors, vertigo, migraine headache. Has history of seizures as a child.  PSYCHIATRIC: Denies anxiety, insomnia, bipolar disorder. Has history of psychosis.   PHYSICAL EXAMINATION: VITAL SIGNS: Temperature 98.5, pulse 113,  respiratory rate 24, blood pressure 126/77, saturating 95% on room air.  GENERAL: Young female who lies comfortable, in no apparent distress.  HEENT: Head  atraumatic, normocephalic. Pupils equal and reactive to light. Pink conjunctivae. Anicteric sclerae. Moist oral mucosa. Has acne on the face.  NECK: Supple. No thyromegaly. No JVD. No carotid bruits.  CHEST: Good air entry bilaterally. No wheezing, rales or rhonchi.  CARDIOVASCULAR: S1, S2 heard. No rubs or gallops.  ABDOMEN: Soft. Has mild tenderness in the lower abdominal area but no rebound, no guarding. Bowel sounds present.  EXTREMITIES: No edema. No clubbing. No cyanosis. Dorsalis pedis pulse +2 bilaterally. Good capillary refill.  MUSCULOSKELETAL: Has no joint effusion. No tenderness. Has right upper extremity contracted.   PSYCHIATRIC: Appropriate affect. Awake and alert x 3, communicative.  NEUROLOGIC: Cranial nerves grossly intact. Motor 5 out of 5 in lower extremities and left upper extremity.  LYMPHATICS: No cervical or supraclavicular lymphadenopathy.  SKIN: Has acne on the face.   PERTINENT LABORATORY DATA: Glucose 132, BUN 5, creatinine 0.72, sodium 136, potassium 3.5, chloride 106, CO2 24, ALT 14, AST 14, alk phos 78. Total bili 0.4. White blood cells 13.1, hemoglobin 13.1, hematocrit 39.3, platelets 217. Urinalysis negative for leukocyte esterase and nitrite. INR 1.2.   ASSESSMENT AND PLAN: 1.  Bilateral pulmonary embolism. The patient was started on heparin drip.  She is not hypoxic. Did not have any chest pain, no hematochezia.  Only sign is tachycardia. We will continue with fluids. Will continue with heparin drip. Will obtain venous Dopplers. We will check hypercoagulable workup. The patient and grandmother were instructed for her to stop the Depo contraception. At one point, if there is no further workup is indicated for the uterine mass, she can switch to p.o. anticoagulation. As well, we will continue the patient on continuous pulse ox, and we will have her on telemetry monitor, and will have her on p.r.n. oxygen to keep her O2 sat at 100%.  2.  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 We will consult OB/GYN for further recommendation if any further workup is indicated.  3.  History of psychosis. The patient is following with a psychiatrist as an outpatient. She is on Invega shots, which can be continued as an outpatient.  4.  History of multiple sclerosis and mild mental retardation. Continue with supportive care.  Does not have any worsening in motor function recently.  5.  Deep vein thrombosis prophylaxis. The patient is on full dose anticoagulation.  6.  Gastrointestinal prophylaxis. Continue with Protonix.   CODE STATUS. The patient is FULL CODE.   TOTAL TIME SPENT ON ADMISSION AND PATIENT CARE: 50 minutes. ____________________________ Albertine Patricia, MD dse:sb D: 07/09/2013 03:19:58 ET T: 07/09/2013 07:20:22 ET JOB#: [id number]  cc: Albertine Patricia, MD, <Dictator> Laneisha Mino Graciela Husbands MD ELECTRONICALLY SIGNED 07/14/2013 12:18

## 2015-08-03 ENCOUNTER — Emergency Department
Admission: EM | Admit: 2015-08-03 | Discharge: 2015-08-03 | Disposition: A | Discharge status: Home / Self Care | Payer: Medicaid Other | Attending: Emergency Medicine | Admitting: Emergency Medicine

## 2015-08-03 DIAGNOSIS — H109 Unspecified conjunctivitis: Secondary | ICD-10-CM | POA: Diagnosis not present

## 2015-08-03 DIAGNOSIS — H5712 Ocular pain, left eye: Secondary | ICD-10-CM | POA: Diagnosis present

## 2015-08-03 MED ORDER — CIPROFLOXACIN HCL 0.3 % OP SOLN
2.0000 [drp] | OPHTHALMIC | Status: DC
  Administered 2015-08-03: 2 [drp] via OPHTHALMIC

## 2015-08-03 MED ORDER — CIPROFLOXACIN HCL 0.3 % OP SOLN
OPHTHALMIC | Status: AC
  Administered 2015-08-03: 2 [drp] via OPHTHALMIC
  Filled 2015-08-03: qty 2.5

## 2015-08-03 MED ORDER — CIPROFLOXACIN HCL 0.3 % OP SOLN: 1.0000 [drp] | OPHTHALMIC | Status: AC

## 2015-08-03 NOTE — ED Provider Notes (Signed)
[facility] Emergency Department Provider Note ____________________________________________  Time seen: Approximately 11:33 AM  I have reviewed the triage vital signs and the nursing notes.   HISTORY  Chief Complaint Eye Pain   HPI Hannah Kennedy is a 29 y.o. female who presents to the emergency department for left pain and irritation.She has  a mental disability and is unable to give a good history. (Grandmother is in the waiting room and does not wish to come back to treatment area.) From what I can deduce, she awakened with eye redness, pain, and drainage.  No past medical history on file.  There are no active problems to display for this patient.   No past surgical history on file.  Current Outpatient Rx  Name  Route  Sig  Dispense  Refill  . ciprofloxacin (CILOXAN) 0.3 % ophthalmic solution   Left Eye   Place 1 drop into the left eye every 2 (two) hours. Administer 1 drop, every 2 hours, while awake, for 2 days. Then 1 drop, every 4 hours, while awake, for the next 5 days.   5 mL   0     Allergies Review of patient's allergies indicates not on file.  No family history on file.  Social History Social History  Substance Use Topics  . Smoking status: Never Smoker   . Smokeless tobacco: Not on file  . Alcohol Use: Not on file    Review of Systems   Constitutional: No fever/chills Eyes: Visual changes: unable to determine. ENT: No sore throat. Cardiovascular: Denies chest pain. Respiratory: Denies shortness of breath. Gastrointestinal: No abdominal pain.  No nausea, no vomiting.  No diarrhea. Musculoskeletal: Negative for pain. Skin: Negative for rash. Neurological: Negative for headaches, focal weakness or numbness. Psychiatric:At baseline, no complaint Lymphatic:Swollen nodes-- no Allergic: Seasonal allergies: unknown 10-point ROS otherwise negative.  ____________________________________________  PHYSICAL EXAM:  VITAL  SIGNS: ED Triage Vitals  Enc Vitals Group     BP 08/03/15 0948 110/74 mmHg     Pulse Rate 08/03/15 0948 103     Resp --      Temp 08/03/15 0948 98 F (36.7 C)     Temp Source 08/03/15 0948 Oral     SpO2 08/03/15 0948 96 %     Weight 08/03/15 0948 120 lb (54.432 kg)     Height 08/03/15 0948  (1.575 m)     Head Cir --      Peak Flow --      Pain Score 08/03/15 0949 9     Pain Loc --      Pain Edu? --      Excl. in GC? --     Constitutional: Alert and oriented. Well appearing and in no acute distress. Eyes: Visual acuity--see nursing documentation; No globe trauma; Eyelids normal to inspection; Everted for exam yes; Conjunctiva and sclera: erythematous, injected; Corneas: normal; Examined with fluorescein no; EOM's intact; Pupils PERRLA; Anterior Chambers normal with limited exam.  Head: Atraumatic. Nose: No congestion/rhinnorhea. Mouth/Throat: Mucous membranes are moist. Cardiovascular:Good peripheral circulation. Respiratory: Normal respiratory effort.  No retractions. Musculoskeletal:Normal ROM Neurologic:  Normal speech and language. No gross focal neurologic deficits are appreciated. Speech is normal. No gait instability. Skin:  Skin is warm, dry and intact. No rash noted. Psychiatric: Mood and affect are normal. Speech and behavior are normal.  ____________________________________________   LABS (all labs ordered are listed, but only abnormal results are displayed)  Labs Reviewed - No data to display ____________________________________________  EKG  ____________________________________________  RADIOLOGY  ____________________________________________   PROCEDURES  Procedure(s) performed: None  ____________________________________________   INITIAL IMPRESSION / ASSESSMENT AND PLAN / ED COURSE  Pertinent labs & imaging results that were available during my care of the patient were reviewed by me and considered in my medical decision making (see chart  for details).   Patient was advised to follow up with ophthalmology for symptoms that are not improving over the next 2 days. She was  also advised to return to the ER for symptoms that change or worsen if unable to schedule an appointment.  ____________________________________________   FINAL CLINICAL IMPRESSION(S) / ED DIAGNOSES  Final diagnoses:  Conjunctivitis of left eye       Chinita Pester, FNP 08/03/15 1141  Minna Antis, MD 08/03/15 1455

## 2015-08-03 NOTE — Discharge Instructions (Signed)
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
## 2016-11-05 ENCOUNTER — Other Ambulatory Visit: Payer: Self-pay | Admitting: Orthopedic Surgery

## 2016-11-05 DIAGNOSIS — M25551 Pain in right hip: Secondary | ICD-10-CM

## 2016-11-16 ENCOUNTER — Ambulatory Visit: Payer: Medicaid Other

## 2016-12-10 ENCOUNTER — Other Ambulatory Visit: Payer: Self-pay | Admitting: Orthopedic Surgery

## 2016-12-10 DIAGNOSIS — M25551 Pain in right hip: Secondary | ICD-10-CM

## 2016-12-10 DIAGNOSIS — G8929 Other chronic pain: Secondary | ICD-10-CM

## 2016-12-10 DIAGNOSIS — M25859 Other specified joint disorders, unspecified hip: Secondary | ICD-10-CM

## 2016-12-10 DIAGNOSIS — M7061 Trochanteric bursitis, right hip: Secondary | ICD-10-CM

## 2016-12-20 ENCOUNTER — Ambulatory Visit
Admission: RE | Admit: 2016-12-20 | Discharge: 2016-12-20 | Disposition: A | Discharge status: Home / Self Care | Payer: Medicaid Other | Source: Ambulatory Visit | Attending: Orthopedic Surgery | Admitting: Orthopedic Surgery

## 2016-12-20 DIAGNOSIS — M25551 Pain in right hip: Secondary | ICD-10-CM

## 2016-12-20 DIAGNOSIS — G8929 Other chronic pain: Secondary | ICD-10-CM

## 2016-12-20 DIAGNOSIS — M25451 Effusion, right hip: Secondary | ICD-10-CM | POA: Diagnosis not present

## 2016-12-20 DIAGNOSIS — M25859 Other specified joint disorders, unspecified hip: Secondary | ICD-10-CM

## 2016-12-20 DIAGNOSIS — M7061 Trochanteric bursitis, right hip: Secondary | ICD-10-CM

## 2016-12-20 DIAGNOSIS — M87051 Idiopathic aseptic necrosis of right femur: Secondary | ICD-10-CM | POA: Insufficient documentation

## 2016-12-28 ENCOUNTER — Encounter (INDEPENDENT_AMBULATORY_CARE_PROVIDER_SITE_OTHER): Payer: Self-pay | Admitting: Vascular Surgery

## 2017-01-04 ENCOUNTER — Encounter (INDEPENDENT_AMBULATORY_CARE_PROVIDER_SITE_OTHER): Payer: Self-pay | Admitting: Vascular Surgery

## 2017-01-04 ENCOUNTER — Ambulatory Visit (INDEPENDENT_AMBULATORY_CARE_PROVIDER_SITE_OTHER): Payer: Medicaid Other | Admitting: Vascular Surgery

## 2017-01-04 VITALS — BP 113/78 | HR 107 | Resp 15 | Ht 64.5 in | Wt 130.0 lb

## 2017-01-04 DIAGNOSIS — Z86711 Personal history of pulmonary embolism: Secondary | ICD-10-CM | POA: Diagnosis not present

## 2017-01-04 DIAGNOSIS — F209 Schizophrenia, unspecified: Secondary | ICD-10-CM | POA: Insufficient documentation

## 2017-01-04 DIAGNOSIS — G809 Cerebral palsy, unspecified: Secondary | ICD-10-CM | POA: Insufficient documentation

## 2017-01-04 DIAGNOSIS — G802 Spastic hemiplegic cerebral palsy: Secondary | ICD-10-CM | POA: Diagnosis not present

## 2017-01-04 DIAGNOSIS — M87051 Idiopathic aseptic necrosis of right femur: Secondary | ICD-10-CM | POA: Diagnosis not present

## 2017-01-04 NOTE — Assessment & Plan Note (Signed)
Given her recent history of pulmonary embolus, she would be at a markedly increased risk of perioperative thromboembolic complications. With this, I would recommend a temporary IVC filter be placed and then removed about 6-8 weeks after surgery. We will go ahead and place this next week as it sounds like her hip surgery will be sometime the following week. Risks and benefits were discussed.

## 2017-01-04 NOTE — Patient Instructions (Signed)
Inferior Vena Cava Filter Insertion Insertion of an inferior vena cava (IVC) filter is a procedure in which a filter is placed into the large vein in your abdomen that carries blood from the lower part of your body to your heart (inferior vena cava). Placement of the filter here helps prevent blood clots in the legs or pelvis from traveling to your lungs. A large blood clot in the lungs can cause death.  The filter is a small, metal device about an inch long. It is shaped like the spokes of an umbrella and is inserted through a pathway created in your neck or groin. The risks of this procedure are usually small and easily managed. Inferior vena cava filters are only used when blood thinners (anticoagulants) cannot be used to prevent blood clots from forming. This may occur because of:  You have severe platelet problems or shortage.  You have had recent or current major bleeding that cannot be treated.  You have bleeding associated with anticoagulants.  You have recurrence of blood clots while on anticoagulants.  You have a need for surgery in the near future.  You have bleeding in your head. EXPECTATIONS OF A FILTER  An IVC filter will reduce the risk of a large blood clot making its way to your lungs (pulmonary embolus, or PE). It cannot eliminate the risk completely, or prevent small PEs from occurring.  It does not stop blood clots from growing, recurring, or developing into postphlebitic syndrome. This is a condition that can occur after there is inflammation in a vein (phlebitis). LET [facility] KNOW ABOUT:  Any allergies you have. This includes an allergy to iodine or contrast dye.  All medicines you are taking, including vitamins, herbs, eye drops, creams and over-the-counter medicines.  Previous problems you or members of your family have had with the use of anesthetics.  Any blood disorders you have.  Previous surgeries you had had.  Medical conditions you  have.  Possibility of pregnancy, if this applies. RISKS AND COMPLICATIONS Generally, this is a safe procedure. However, as with any procedure, problems can occur. Possible problems include:  A small bruise around the needle insertion site. A larger pooling of blood called a hematoma may form. This is usually of no concern.  The filter can block the vena cava. This can cause some swelling of the legs.  The filter may eventually fail and not work properly.  Continued bleeding or infections (uncommon).  Damage to the vein caused by a thin, flexible tube (catheter) that is used in this procedure. This is rare. BEFORE THE PROCEDURE  Your health care provider may want you to have blood tests. These tests can help tell how well your kidneys and liver are working. They can also show how well your blood clots.  If you take blood thinners, ask your health care provider if and when you should stop taking them.  Do not eat or drink for 4 hours before the procedure or as directed by your health care provider.  Make arrangements for someone to drive you home. Depending on the procedure, you may be able to go home the same day.  PROCEDURE   Insertion of a vena cava filter is performed by a vascular surgeon or cardiologist in a cardiac catheterization lab.  The procedure usually takes about 30 minutes to 1 hour. This can vary.  An IV needle will be inserted into one of your veins. Medicine will be able to flow directly into your  body through this needle.  Medicines may given to help you relax and relieve anxiety (sedative).  The procedure is done through a large vein in your neck or groin. A germ-killing solution (antiseptic) will be used to wash the area where the catheter will be inserted. Hair may be removed from this area.  The skin and deeper tissues over the vein will be made numb with a local anesthetic. You are awake during the procedure and can let your health care providers know if you  have discomfort.  A needle is then put into the vein. A guide wire is placed through the needle and into the vein. This is used to help insert a catheter and the IVC filter into your vein.  Contrast dye may be injected into the inferior vena cava to help guide the catheter and verify precise placement of the IVC filter. X-ray equipment may also be used to verify that the catheter and the wire are in the correct position.  The wire is then withdrawn.  The IVC filter is then passed over the catheter into the vein, and inserted into the correct location in the vena cava.  The catheter is then removed. Pressure will be kept on the needle insertion point for several minutes or until it is unlikely to bleed. AFTER THE PROCEDURE  You will stay in a recovery area until any sedation medicine you were given has worn off. Your blood pressure and pulse will be checked.  If there are no problems, you should be able to go home after the procedure.  You may feel sore at the area of the needle insertion for a few days. This information is not intended to replace advice given to you by your health care provider. Make sure you discuss any questions you have with your health care provider. Document Released: 12/29/2005 Document Revised: 03/01/2016 Document Reviewed: 07/16/2013 Elsevier Interactive Patient Education  2017 [employer]

## 2017-01-04 NOTE — Assessment & Plan Note (Signed)
Quite debilitated by this at a baseline. Her hip issues on top of this become disabling.

## 2017-01-04 NOTE — Assessment & Plan Note (Signed)
This is going to require a major hip replacement. Given her recent history of pulmonary embolus, she would be at a markedly increased risk of perioperative thromboembolic complications. With this, I would recommend a temporary IVC filter be placed and then removed about 6-8 weeks after surgery. We will go ahead and place this next week as it sounds like her hip surgery will be sometime the following week. Risks and benefits were discussed.

## 2017-01-04 NOTE — Progress Notes (Signed)
Patient ID: Hannah Kennedy, female   DOB: 1986-06-20, 31 y.o.   MRN: [id number]  Chief Complaint  Patient presents with  . New Evaluation    Need total right hip arthroplasty    HPI Hannah Kennedy is a 30 y.o. female.  I am asked to see the patient by Dr. Rosita Kea for evaluation of IVC filter placement.  The patient Is a poor historian but her family with her today help with the history. She apparently had a DVT and pulmonary embolus 2-3 years ago. She has had worsening pain and immobility secondary to avascular necrosis of the right hip. This has been a progressive problem over several months resulting in clinical deterioration. She now needs a major hip replacement, and given her previous history of DVT and pulmonary embolus we were asked to place an IVC filter. She has no current chest pain or shortness of breath. She has no fever or chills.  There was no obvious trauma or injury and her previous PE was not clearly provokes.   Past Medical History:  Diagnosis Date  . Cerebral palsy (HCC)   . Depression   . Hemiparesis (HCC)   . 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 Schizophrenia (HCC)     No past surgical history on file.  Family History No known family history of bleeding disorders, clotting disorders, autoimmune diseases, or aneurysms  Social History Social History  Substance Use Topics  . Smoking status: Never Smoker  . Smokeless tobacco: Never Used  . Alcohol use No  Lives in a group home  Allergies  Allergen Reactions  . Sulfamethoxazole-Trimethoprim     Other reaction(s): Other (See Comments) Other reaction(s): GI upset    Current Outpatient Prescriptions  Medication Sig Dispense Refill  . Adalimumab (HUMIRA PEN) 40 MG/0.8ML PNKT INJECT ONE pen Subcutaneously EVERY other WEEK    . chlorproMAZINE (THORAZINE) 50 MG tablet Take 50 mg by mouth.    . fluticasone (FLONASE) 50 MCG/ACT nasal spray Place into the nose.    . folic acid (FOLVITE) 1 MG tablet Take by mouth.     Marland Kitchen ibuprofen (ADVIL,MOTRIN) 600 MG tablet Take 600 mg by mouth.    . latanoprost (XALATAN) 0.005 % ophthalmic solution Apply to eye.    . methotrexate (RHEUMATREX) 2.5 MG tablet Take 6 tablets (15 mg) every WEEK for 2 weeks. If tolerating well, increase to 8 tablets (20 mg) every WEEK    . Miconazole Nitrate (LOTRIMIN AF) 2 % AERO Apply topically.    Marland Kitchen omeprazole (PRILOSEC) 20 MG capsule Take by mouth.    . paliperidone (INVEGA SUSTENNA) 234 MG/1.5ML SUSP injection Inject into the muscle.    . Paliperidone Palmitate (INVEGA TRINZA) 410 MG/1.315ML SUSP     . predniSONE (DELTASONE) 10 MG tablet Take by mouth.    . sertraline (ZOLOFT) 50 MG tablet Take by mouth.    . timolol (TIMOPTIC) 0.25 % ophthalmic solution Apply to eye.    . traMADol (ULTRAM) 50 MG tablet TK 1 TO 2 TS PO Q 6 H PRN FOR PAIN  0  . vitamin B-12 (CYANOCOBALAMIN) 1000 MCG tablet Take by mouth.     No current facility-administered medications for this visit.       REVIEW OF SYSTEMS (Negative unless checked)  Constitutional: [] Weight loss  [] Fever  [] Chills Cardiac: [] Chest pain   [] Chest pressure   [] Palpitations   [] Shortness of breath when laying flat   [] Shortness of breath at rest   [] Shortness  of breath with exertion. Vascular:  [x] Pain in legs with walking   [x] Pain in legs at rest   [] Pain in legs when laying flat   [] Claudication   [] Pain in feet when walking  [] Pain in feet at rest  [] Pain in feet when laying flat   [x] History of DVT   [] Phlebitis   [] Swelling in legs   [] Varicose veins   [] Non-healing ulcers Pulmonary:   [] Uses home oxygen   [] Productive cough   [] Hemoptysis   [] Wheeze  [] COPD   [] Asthma Neurologic:  [] Dizziness  [] Blackouts   [] Seizures   [] History of stroke   [] History of TIA  [] Aphasia   [] Temporary blindness   [] Dysphagia   [x] Weakness or numbness in arms   [x] Weakness or numbness in legs Musculoskeletal:  [x] Arthritis   [] Joint swelling   [] Joint pain   [] Low back pain Hematologic:  [] Easy  bruising  [] Easy bleeding   [] Hypercoagulable state   [] Anemic  [] Hepatitis Gastrointestinal:  [] Blood in stool   [] Vomiting blood  [] Gastroesophageal reflux/heartburn   [] Abdominal pain Genitourinary:  [] Chronic kidney disease   [] Difficult urination  [] Frequent urination  [] Burning with urination   [] Hematuria Skin:  [] Rashes   [] Ulcers   [] Wounds Psychological:  [] History of anxiety   []  History of major depression.    Physical Exam BP 113/78 (BP Location: Left Arm)   Pulse (!) 107   Resp 15   Ht 5' 4.5" (1.638 m)   Wt 130 lb (59 kg)   BMI 21.97 kg/m  Gen:  WD/WN, NAD. Debilitated apperaing Head: [place]/AT, No temporalis wasting. Prominent temp pulse not noted. Ear/Nose/Throat: Hearing grossly intact, nares w/o erythema or drainage, oropharynx w/o Erythema/Exudate. Dentition poor Eyes: Conjunctiva clear, sclera non-icteric  Neck: trachea midline.  No JVD.  Pulmonary:  Good air movement, equal bilaterally, respirations not labored.  Cardiac: RRR, normal S1, S2 Vascular:  Vessel Right Left  Radial Palpable Palpable  Ulnar Palpable Palpable  Brachial Palpable Palpable  Carotid Palpable, without bruit Palpable, without bruit  Aorta Not palpable N/A  Femoral Palpable Palpable  Popliteal Palpable Palpable  PT Palpable Palpable  DP Palpable Palpable   Gastrointestinal: soft, non-tender/non-distended. No guarding/reflex. No masses, surgical incisions, or scars. Musculoskeletal: RIght hemiparesis appears present.  Extremities without ischemic changes.  No deformity or atrophy. 1+ BLE edema. Neurologic: speech is fairly good. Spasticity is present and right sided weakness is present.   Psychiatric: Judgment and insight are fair at best, Mood & affect appropriate for pt's clinical situation. Dermatologic: No rashes or ulcers noted.  No cellulitis or open wounds. Lymph : No Cervical, Axillary, or Inguinal lymphadenopathy.   Radiology Mr Hip Right Wo Contrast  Result Date:  12/20/2016 CLINICAL DATA:  Right hip pain. EXAM: MR OF THE RIGHT HIP WITHOUT CONTRAST TECHNIQUE: Multiplanar, multisequence MR imaging was performed. No intravenous contrast was administered. COMPARISON:  None. 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 Femoral head is collapsed. There is edema in the remainder of the femoral head and neck. The other bones are normal including the left femoral head. Articular cartilage and labrum Articular cartilage: No discrete thinning of the articular cartilage. Labrum:  Intact. Joint or bursal effusion Joint effusion:  Moderate right hip effusion. Bursae:  Normal. Muscles and tendons Muscles and tendons: Subtle atrophy of the right gluteus medius and minimus muscles, probably due to disuse. 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 Electronically Signed   By: Francene Boyers M.D.   On: 12/20/2016 16:18    Labs No results found for this or any previous visit (from the past 2160 hour(s)).  Assessment/Plan:  Cerebral palsy (HCC) Quite debilitated by this at a baseline. Her hip issues on top of this become disabling.  Schizophrenia (HCC) On medications.  Avascular necrosis of bone of hip, right (HCC) This is going to require a major hip replacement. Given her recent history of pulmonary embolus, she would be at a markedly increased risk of perioperative thromboembolic complications. With this, I would recommend a temporary IVC filter be placed and then removed about 6-8 weeks after surgery. We will go ahead and place this next week as it sounds like her hip surgery will be sometime the following week. Risks and benefits were discussed.  Hx of pulmonary embolus Given her recent history of pulmonary embolus, she would be at a markedly increased risk of perioperative thromboembolic  complications. With this, I would recommend a temporary IVC filter be placed and then removed about 6-8 weeks after surgery. We will go ahead and place this next week as it sounds like her hip surgery will be sometime the following week. Risks and benefits were discussed.      Festus Barren 01/04/2017, 4:19 PM   This note was created with Dragon medical transcription system.  Any errors from dictation are unintentional.

## 2017-01-04 NOTE — Assessment & Plan Note (Signed)
On medications 

## 2017-01-06 ENCOUNTER — Encounter (INDEPENDENT_AMBULATORY_CARE_PROVIDER_SITE_OTHER): Payer: Self-pay

## 2017-01-10 ENCOUNTER — Other Ambulatory Visit (INDEPENDENT_AMBULATORY_CARE_PROVIDER_SITE_OTHER): Payer: Self-pay

## 2017-01-12 MED ORDER — CEFAZOLIN IN D5W 1 GM/50ML IV SOLN: 1.0000 g | Freq: Once | INTRAVENOUS | Status: DC

## 2017-01-13 ENCOUNTER — Encounter: Payer: Self-pay | Admitting: *Deleted

## 2017-01-13 ENCOUNTER — Ambulatory Visit
Admission: RE | Admit: 2017-01-13 | Discharge: 2017-01-13 | Disposition: A | Discharge status: Home / Self Care | Payer: Medicaid Other | Source: Ambulatory Visit | Attending: Vascular Surgery | Admitting: Vascular Surgery

## 2017-01-13 ENCOUNTER — Encounter
Admission: RE | Disposition: A | Discharge status: Home / Self Care | Payer: Self-pay | Source: Ambulatory Visit | Attending: Vascular Surgery

## 2017-01-13 DIAGNOSIS — G802 Spastic hemiplegic cerebral palsy: Secondary | ICD-10-CM | POA: Insufficient documentation

## 2017-01-13 DIAGNOSIS — F209 Schizophrenia, unspecified: Secondary | ICD-10-CM | POA: Diagnosis not present

## 2017-01-13 DIAGNOSIS — I2699 Other pulmonary embolism without acute cor pulmonale: Secondary | ICD-10-CM | POA: Diagnosis not present

## 2017-01-13 DIAGNOSIS — M87051 Idiopathic aseptic necrosis of right femur: Secondary | ICD-10-CM | POA: Insufficient documentation

## 2017-01-13 DIAGNOSIS — G2119 Other drug induced secondary parkinsonism: Secondary | ICD-10-CM | POA: Insufficient documentation

## 2017-01-13 DIAGNOSIS — Z01818 Encounter for other preprocedural examination: Secondary | ICD-10-CM | POA: Diagnosis not present

## 2017-01-13 DIAGNOSIS — I82409 Acute embolism and thrombosis of unspecified deep veins of unspecified lower extremity: Secondary | ICD-10-CM | POA: Diagnosis not present

## 2017-01-13 DIAGNOSIS — Z882 Allergy status to sulfonamides status: Secondary | ICD-10-CM | POA: Insufficient documentation

## 2017-01-13 DIAGNOSIS — Z79899 Other long term (current) drug therapy: Secondary | ICD-10-CM | POA: Diagnosis not present

## 2017-01-13 HISTORY — PX: IVC FILTER INSERTION: CATH118245

## 2017-01-13 SURGERY — IVC FILTER INSERTION
Anesthesia: Moderate Sedation

## 2017-01-13 MED ORDER — SODIUM CHLORIDE 0.9 % IV SOLN
INTRAVENOUS | Status: DC
  Administered 2017-01-13: 09:00:00 via INTRAVENOUS

## 2017-01-13 MED ORDER — CEFAZOLIN IN D5W 1 GM/50ML IV SOLN
1.0000 g | Freq: Once | INTRAVENOUS | Status: AC
  Administered 2017-01-13: 1 g via INTRAVENOUS

## 2017-01-13 MED ORDER — MIDAZOLAM HCL 2 MG/2ML IJ SOLN
INTRAMUSCULAR | Status: DC | PRN
  Administered 2017-01-13: 2 mg via INTRAVENOUS

## 2017-01-13 MED ORDER — MIDAZOLAM HCL 2 MG/2ML IJ SOLN
INTRAMUSCULAR | Status: AC
  Filled 2017-01-13: qty 4

## 2017-01-13 MED ORDER — FENTANYL CITRATE (PF) 100 MCG/2ML IJ SOLN
INTRAMUSCULAR | Status: DC | PRN
  Administered 2017-01-13: 50 ug via INTRAVENOUS

## 2017-01-13 MED ORDER — CEFAZOLIN IN D5W 1 GM/50ML IV SOLN
INTRAVENOUS | Status: AC
  Filled 2017-01-13: qty 50

## 2017-01-13 MED ORDER — LIDOCAINE-EPINEPHRINE (PF) 2 %-1:200000 IJ SOLN
INTRAMUSCULAR | Status: AC
  Filled 2017-01-13: qty 20

## 2017-01-13 MED ORDER — FENTANYL CITRATE (PF) 100 MCG/2ML IJ SOLN
INTRAMUSCULAR | Status: AC
  Filled 2017-01-13: qty 2

## 2017-01-13 SURGICAL SUPPLY — 6 items
FILTER VC CELECT-FEMORAL (Filter) ×3 IMPLANT
GLOVE SURG SYN 7.0 (GLOVE) ×3 IMPLANT
GLOVE SURG SYN 7.5  E (GLOVE) ×2
GLOVE SURG SYN 7.5 E (GLOVE) ×1 IMPLANT
PACK ANGIOGRAPHY (CUSTOM PROCEDURE TRAY) ×3 IMPLANT
WIRE J 3MM .035X145CM (WIRE) ×3 IMPLANT

## 2017-01-13 NOTE — H&P (Signed)
[facility] History & Physical Update  The patient was interviewed and re-examined.  The patient's previous History and Physical has been reviewed and is unchanged.  There is no change in the plan of care. We plan to proceed with the scheduled procedure.  Festus BarrenJason Dew, MD  01/13/2017, 8:28 AM

## 2017-01-13 NOTE — Op Note (Signed)
[facility]   OPERATIVE NOTE    PRE-OPERATIVE DIAGNOSIS: DVT/PE and upcoming hip replacement  POST-OPERATIVE DIAGNOSIS: same as above  PROCEDURE: 1.   Ultrasound guidance for vascular access to the left femoral vein 2.   Catheter placement into the inferior vena cava 3.   Inferior venacavogram 4.   Placement of a Cook Celect IVC filter  SURGEON: Festus BarrenJason Dew, MD  ASSISTANT(S): None  ANESTHESIA: local with Moderate Conscious Sedation for approximately 15 minutes using 2 mg of Versed and 50 mcg of Fentanyl  ESTIMATED BLOOD LOSS: minimal  CONTRAST: 15 cc  FLUORO TIME: less than one minute  FINDING(S): 1.  Patent IVC  SPECIMEN(S):  none  INDICATIONS:   Hannah Kennedy is a 31 y.o. female who presents with upcoming hip replacement.  She has a history of DVT and PE and will need to be off anticoagulation for surgery and has a high risk of thromboembolic events.  Inferior vena cava filter is indicated for this reason.  Risks and benefits including filter thrombosis, migration, fracture, bleeding, and infection were all discussed.  We discussed that all IVC filters that we place can be removed if desired from the patient once the need for the filter has passed.    DESCRIPTION: After obtaining full informed written consent, the patient was brought back to the vascular suite. The skin was sterilely prepped and draped in a sterile surgical field was created. Moderate conscious sedation was administered during a face to face encounter with the patient throughout the procedure with my supervision of the RN administering medicines and monitoring the patient's vital signs, pulse oximetry, telemetry and mental status throughout from the start of the procedure until the patient was taken to the recovery room. The left femoral vein was accessed under direct ultrasound guidance without difficulty with a Seldinger needle and a J-wire was then placed. After skin nick and  dilatation, the delivery sheath was placed into the inferior vena cava and an inferior venacavogram was performed. This demonstrated a patent IVC with the level of the renal veins at L1-L2 interspace.  The filter was then deployed into the inferior vena cava at the level of the top of L2 just below the renal veins. The delivery sheath was then removed. Pressure was held. Sterile dressings were placed. The patient tolerated the procedure well and was taken to the recovery room in stable condition.  COMPLICATIONS: None  CONDITION: Stable  Festus BarrenJason Dew  01/13/2017, 10:54 AM   This note was created with Dragon Medical transcription system. Any errors in dictation are purely unintentional.

## 2017-01-13 NOTE — Discharge Instructions (Signed)
Inferior Vena Cava Filter Insertion, Care After °Refer to this sheet in the next few weeks. These instructions provide you with information on caring for yourself after your procedure. Your health care provider may also give you more specific instructions. Your treatment has been planned according to current medical practices, but problems sometimes occur. Call your health care provider if you have any problems or questions after your procedure. °WHAT TO EXPECT AFTER THE PROCEDURE °After your procedure, it is typical to have the following: °· Mild pain in the area where the filter was inserted. °· Mild bruising in the area where the filter was inserted. °HOME CARE INSTRUCTIONS °· You will be given medicine to control pain. Only take over-the-counter or prescription medicines for pain, fever, or discomfort as directed by your health care provider. °· A bandage (dressing) has been placed over the insertion site. Follow your health care provider's instructions on how to care for it. °· Keep the insertion site clean and dry. °· Do not soak in a bath tub or pool until the filter insertion site has healed. °· Do not drive if you are taking narcotic pain medicines. Follow your health care provider's instructions about driving.  °· Do not return to work or school until your health care provider says it is okay.   °· Keep all follow-up appointments.   °SEEK IMMEDIATE MEDICAL CARE IF: °· You develop swelling and discoloration or pain in the legs. °· Your legs become pale and cold or blue. °· You develop shortness of breath, feel faint, or pass out. °· You develop chest pain, a cough, or difficulty breathing. °· You cough up blood. °· You develop a rash or feel you are having problems that may be a side effect of medicines. °· You develop weakness, difficulty moving your arms or legs, or balance problems. °· You develop problems with speech or vision. °This information is not intended to replace advice given to you by your  health care provider. Make sure you discuss any questions you have with your health care provider. °Document Released: 08/29/2013 Document Reviewed: 08/29/2013 °Elsevier Interactive Patient Education © 2017 [employer] ° °

## 2017-01-14 ENCOUNTER — Other Ambulatory Visit: Payer: Self-pay | Admitting: *Deleted

## 2017-01-14 ENCOUNTER — Encounter
Admission: RE | Admit: 2017-01-14 | Discharge: 2017-01-14 | Disposition: A | Discharge status: Home / Self Care | Payer: Medicaid Other | Source: Ambulatory Visit | Attending: Orthopedic Surgery | Admitting: Orthopedic Surgery

## 2017-01-14 DIAGNOSIS — Z01818 Encounter for other preprocedural examination: Secondary | ICD-10-CM | POA: Diagnosis not present

## 2017-01-14 HISTORY — DX: Unspecified glaucoma: H40.9

## 2017-01-14 HISTORY — DX: Unspecified osteoarthritis, unspecified site: M19.90

## 2017-01-14 HISTORY — DX: Other pulmonary embolism without acute cor pulmonale: I26.99

## 2017-01-14 HISTORY — DX: Gastro-esophageal reflux disease without esophagitis: K21.9

## 2017-01-14 LAB — CBC
HCT: 40.9 % (ref 35.0–47.0)
HEMOGLOBIN: 13.9 g/dL (ref 12.0–16.0)
MCH: 29.2 pg (ref 26.0–34.0)
MCHC: 34 g/dL (ref 32.0–36.0)
MCV: 85.8 fL (ref 80.0–100.0)
PLATELETS: 236 10*3/uL (ref 150–440)
RBC: 4.77 MIL/uL (ref 3.80–5.20)
RDW: 13.9 % (ref 11.5–14.5)
WBC: 6.2 10*3/uL (ref 3.6–11.0)

## 2017-01-14 LAB — BASIC METABOLIC PANEL
ANION GAP: 6 (ref 5–15)
BUN: 12 mg/dL (ref 6–20)
CALCIUM: 9 mg/dL (ref 8.9–10.3)
CO2: 26 mmol/L (ref 22–32)
Chloride: 105 mmol/L (ref 101–111)
Creatinine, Ser: 0.67 mg/dL (ref 0.44–1.00)
Glucose, Bld: 93 mg/dL (ref 65–99)
Potassium: 4.3 mmol/L (ref 3.5–5.1)
SODIUM: 137 mmol/L (ref 135–145)

## 2017-01-14 LAB — PROTIME-INR
INR: 1.07
Prothrombin Time: 13.9 seconds (ref 11.4–15.2)

## 2017-01-14 LAB — TYPE AND SCREEN
ABO/RH(D): O POS
Antibody Screen: NEGATIVE

## 2017-01-14 LAB — URINALYSIS, COMPLETE (UACMP) WITH MICROSCOPIC
Bilirubin Urine: NEGATIVE
Glucose, UA: NEGATIVE mg/dL
Hgb urine dipstick: NEGATIVE
KETONES UR: NEGATIVE mg/dL
Nitrite: NEGATIVE
PH: 6 (ref 5.0–8.0)
Protein, ur: NEGATIVE mg/dL
Specific Gravity, Urine: 1.029 (ref 1.005–1.030)

## 2017-01-14 LAB — APTT: APTT: 30 s (ref 24–36)

## 2017-01-14 LAB — SURGICAL PCR SCREEN
MRSA, PCR: NEGATIVE
STAPHYLOCOCCUS AUREUS: NEGATIVE

## 2017-01-14 LAB — SEDIMENTATION RATE: SED RATE: 12 mm/h (ref 0–20)

## 2017-01-14 NOTE — Patient Instructions (Addendum)
  Your procedure is scheduled on: 01/18/17 Tues Report to Same Day Surgery 2nd floor medical mall [facility](Medical Mall Entrance-take elevator on left to 2nd floor.  Check in with surgery information desk.) To find out your arrival time please call [phone] between 1PM - 3PM on 01/17/17 Mon  Remember: Instructions that are not followed completely may result in serious medical risk, up to and including death, or upon the discretion of your surgeon and anesthesiologist your surgery may need to be rescheduled.    _x___ 1. Do not eat food or drink liquids after midnight. No gum chewing or hard candies.     __x__ 2. No Alcohol for 24 hours before or after surgery.   __x__3. No Smoking for 24 prior to surgery.   ____  4. Bring all medications with you on the day of surgery if instructed.    __x__ 5. Notify your doctor if there is any change in your medical condition     (cold, fever, infections).     Do not wear jewelry, make-up, hairpins, clips or nail polish.  Do not wear lotions, powders, or perfumes. You may wear deodorant.  Do not shave 48 hours prior to surgery. Men may shave face and neck.  Do not bring valuables to the hospital.    [facility] is not responsible for any belongings or valuables.               Contacts, dentures or bridgework may not be worn into surgery.  Leave your suitcase in the car. After surgery it may be brought to your room.  For patients admitted to the hospital, discharge time is determined by your treatment team.   Patients discharged the day of surgery will not be allowed to drive home.  You will need someone to drive you home and stay with you the night of your procedure.    Please read over the following fact sheets that you were given:   [facility] Preparing for Surgery and or MRSA Information   _x___ Take these medicines the morning of surgery with A SIP OF WATER:    1. May use eye drops  2.chlorproMAZINE (THORAZINE  3.omeprazole  (PRILOSEC  4.sertraline (ZOLOFT  5.  6.  ____Fleets enema or Magnesium Citrate as directed.   _x___ Use CHG Soap or sage wipes as directed on instruction sheet   ____ Use inhalers on the day of surgery and bring to hospital day of surgery  ____ Stop metformin 2 days prior to surgery    ____ Take 1/2 of usual insulin dose the night before surgery and none on the morning of           surgery.   ____ Stop Aspirin, Coumadin, Pllavix ,Eliquis, Effient, or Pradaxa  x__ Stop Anti-inflammatories such as Advil, Aleve, Ibuprofen, Motrin, Naproxen,          Naprosyn, Goodies powders or aspirin products. Ok to take Tylenol.   ____ Stop supplements until after surgery.    ____ Bring C-Pap to the hospital.

## 2017-01-15 LAB — URINE CULTURE

## 2017-01-17 NOTE — Pre-Procedure Instructions (Signed)
Urine culture multiple species present, suggest recollection result faxed to Dr. Rosita KeaMenz.

## 2017-01-17 NOTE — Pre-Procedure Instructions (Signed)
Spoke with Dr. Rosita KeaMenz nurse, do not need to recollect urine culture.

## 2017-01-18 ENCOUNTER — Inpatient Hospital Stay: Payer: Medicaid Other

## 2017-01-18 ENCOUNTER — Inpatient Hospital Stay
Admission: RE | Admit: 2017-01-18 | Discharge: 2017-01-21 | DRG: 470 | Disposition: A | Discharge status: Rehabilitation Facility | Payer: Medicaid Other | Source: Ambulatory Visit | Attending: Orthopedic Surgery | Admitting: Orthopedic Surgery

## 2017-01-18 ENCOUNTER — Inpatient Hospital Stay: Payer: Medicaid Other | Admitting: Anesthesiology

## 2017-01-18 ENCOUNTER — Encounter: Payer: Self-pay | Admitting: *Deleted

## 2017-01-18 ENCOUNTER — Encounter
Admission: RE | Disposition: A | Discharge status: Rehabilitation Facility | Payer: Self-pay | Source: Ambulatory Visit | Attending: Orthopedic Surgery

## 2017-01-18 DIAGNOSIS — G8918 Other acute postprocedural pain: Secondary | ICD-10-CM

## 2017-01-18 DIAGNOSIS — Z79899 Other long term (current) drug therapy: Secondary | ICD-10-CM | POA: Diagnosis not present

## 2017-01-18 DIAGNOSIS — G809 Cerebral palsy, unspecified: Secondary | ICD-10-CM | POA: Diagnosis present

## 2017-01-18 DIAGNOSIS — Z86711 Personal history of pulmonary embolism: Secondary | ICD-10-CM

## 2017-01-18 DIAGNOSIS — M87851 Other osteonecrosis, right femur: Secondary | ICD-10-CM | POA: Diagnosis present

## 2017-01-18 DIAGNOSIS — H409 Unspecified glaucoma: Secondary | ICD-10-CM | POA: Diagnosis present

## 2017-01-18 DIAGNOSIS — Z419 Encounter for procedure for purposes other than remedying health state, unspecified: Secondary | ICD-10-CM

## 2017-01-18 DIAGNOSIS — M25551 Pain in right hip: Secondary | ICD-10-CM | POA: Diagnosis present

## 2017-01-18 DIAGNOSIS — F209 Schizophrenia, unspecified: Secondary | ICD-10-CM | POA: Diagnosis present

## 2017-01-18 DIAGNOSIS — G2119 Other drug induced secondary parkinsonism: Secondary | ICD-10-CM | POA: Diagnosis present

## 2017-01-18 DIAGNOSIS — F329 Major depressive disorder, single episode, unspecified: Secondary | ICD-10-CM | POA: Diagnosis present

## 2017-01-18 DIAGNOSIS — K219 Gastro-esophageal reflux disease without esophagitis: Secondary | ICD-10-CM | POA: Diagnosis present

## 2017-01-18 DIAGNOSIS — Z7901 Long term (current) use of anticoagulants: Secondary | ICD-10-CM | POA: Diagnosis not present

## 2017-01-18 DIAGNOSIS — M87151 Osteonecrosis due to drugs, right femur: Secondary | ICD-10-CM | POA: Diagnosis present

## 2017-01-18 HISTORY — PX: TOTAL HIP ARTHROPLASTY: SHX124

## 2017-01-18 HISTORY — DX: Deficiency of other specified B group vitamins: E53.8

## 2017-01-18 HISTORY — DX: Chronic iridocyclitis, unspecified eye: H20.10

## 2017-01-18 LAB — CBC
HEMATOCRIT: 39.1 % (ref 35.0–47.0)
Hemoglobin: 13.2 g/dL (ref 12.0–16.0)
MCH: 29.2 pg (ref 26.0–34.0)
MCHC: 33.9 g/dL (ref 32.0–36.0)
MCV: 86.1 fL (ref 80.0–100.0)
PLATELETS: 219 10*3/uL (ref 150–440)
RBC: 4.54 MIL/uL (ref 3.80–5.20)
RDW: 14 % (ref 11.5–14.5)
WBC: 20.7 10*3/uL — AB (ref 3.6–11.0)

## 2017-01-18 LAB — CREATININE, SERUM: Creatinine, Ser: 0.61 mg/dL (ref 0.44–1.00)

## 2017-01-18 LAB — POCT PREGNANCY, URINE: PREG TEST UR: NEGATIVE

## 2017-01-18 LAB — ABO/RH: ABO/RH(D): O POS

## 2017-01-18 SURGERY — ARTHROPLASTY, HIP, TOTAL, ANTERIOR APPROACH
Anesthesia: General | Laterality: Right | Wound class: Clean

## 2017-01-18 MED ORDER — TIMOLOL MALEATE 0.25 % OP SOLN
1.0000 [drp] | Freq: Every day | OPHTHALMIC | Status: DC
  Administered 2017-01-18 – 2017-01-21 (×4): 1 [drp] via OPHTHALMIC
  Filled 2017-01-18: qty 5

## 2017-01-18 MED ORDER — ENOXAPARIN SODIUM 40 MG/0.4ML ~~LOC~~ SOLN
40.0000 mg | SUBCUTANEOUS | Status: DC
  Administered 2017-01-19 – 2017-01-21 (×3): 40 mg via SUBCUTANEOUS
  Filled 2017-01-18 (×3): qty 0.4

## 2017-01-18 MED ORDER — ACETAMINOPHEN 325 MG PO TABS
650.0000 mg | ORAL_TABLET | Freq: Four times a day (QID) | ORAL | Status: DC | PRN
  Administered 2017-01-20 – 2017-01-21 (×3): 650 mg via ORAL
  Filled 2017-01-18 (×3): qty 2

## 2017-01-18 MED ORDER — BUPIVACAINE HCL (PF) 0.25 % IJ SOLN
INTRAMUSCULAR | Status: AC
  Filled 2017-01-18: qty 30

## 2017-01-18 MED ORDER — ROCURONIUM BROMIDE 50 MG/5ML IV SOLN
INTRAVENOUS | Status: AC
  Filled 2017-01-18: qty 1

## 2017-01-18 MED ORDER — SERTRALINE HCL 50 MG PO TABS
50.0000 mg | ORAL_TABLET | Freq: Every day | ORAL | Status: DC
  Administered 2017-01-19 – 2017-01-20 (×2): 50 mg via ORAL
  Filled 2017-01-18 (×2): qty 1

## 2017-01-18 MED ORDER — MORPHINE SULFATE (PF) 2 MG/ML IV SOLN
2.0000 mg | INTRAVENOUS | Status: DC | PRN
  Administered 2017-01-18: 2 mg via INTRAVENOUS
  Filled 2017-01-18: qty 1

## 2017-01-18 MED ORDER — TRANEXAMIC ACID 1000 MG/10ML IV SOLN
INTRAVENOUS | Status: DC | PRN
  Administered 2017-01-18: 1000 mg via INTRAVENOUS

## 2017-01-18 MED ORDER — CHLORPROMAZINE HCL 100 MG PO TABS
100.0000 mg | ORAL_TABLET | ORAL | Status: DC
  Administered 2017-01-19 – 2017-01-21 (×3): 100 mg via ORAL
  Filled 2017-01-18 (×3): qty 1

## 2017-01-18 MED ORDER — SUCCINYLCHOLINE CHLORIDE 20 MG/ML IJ SOLN
INTRAMUSCULAR | Status: DC | PRN
  Administered 2017-01-18: 100 mg via INTRAVENOUS

## 2017-01-18 MED ORDER — SODIUM CHLORIDE 0.9 % IV SOLN
INTRAVENOUS | Status: DC
  Administered 2017-01-18 – 2017-01-19 (×2): via INTRAVENOUS

## 2017-01-18 MED ORDER — METHOCARBAMOL 500 MG PO TABS: 500.0000 mg | ORAL_TABLET | Freq: Four times a day (QID) | ORAL | Status: DC | PRN

## 2017-01-18 MED ORDER — LIDOCAINE HCL (PF) 2 % IJ SOLN
INTRAMUSCULAR | Status: AC
  Filled 2017-01-18: qty 2

## 2017-01-18 MED ORDER — ROCURONIUM BROMIDE 100 MG/10ML IV SOLN
INTRAVENOUS | Status: DC | PRN
  Administered 2017-01-18: 10 mg via INTRAVENOUS
  Administered 2017-01-18: 40 mg via INTRAVENOUS

## 2017-01-18 MED ORDER — DEXAMETHASONE SODIUM PHOSPHATE 10 MG/ML IJ SOLN
INTRAMUSCULAR | Status: AC
  Filled 2017-01-18: qty 1

## 2017-01-18 MED ORDER — CEFAZOLIN SODIUM-DEXTROSE 2-4 GM/100ML-% IV SOLN
INTRAVENOUS | Status: AC
  Filled 2017-01-18: qty 100

## 2017-01-18 MED ORDER — FENTANYL CITRATE (PF) 100 MCG/2ML IJ SOLN
INTRAMUSCULAR | Status: AC
  Administered 2017-01-18: 50 ug via INTRAVENOUS
  Filled 2017-01-18: qty 2

## 2017-01-18 MED ORDER — MAGNESIUM HYDROXIDE 400 MG/5ML PO SUSP
30.0000 mL | Freq: Every day | ORAL | Status: DC | PRN
  Administered 2017-01-20: 30 mL via ORAL
  Filled 2017-01-18: qty 30

## 2017-01-18 MED ORDER — ONDANSETRON HCL 4 MG PO TABS: 4.0000 mg | ORAL_TABLET | Freq: Four times a day (QID) | ORAL | Status: DC | PRN

## 2017-01-18 MED ORDER — PROPOFOL 500 MG/50ML IV EMUL
INTRAVENOUS | Status: AC
  Filled 2017-01-18: qty 50

## 2017-01-18 MED ORDER — FENTANYL CITRATE (PF) 250 MCG/5ML IJ SOLN
INTRAMUSCULAR | Status: AC
  Filled 2017-01-18: qty 5

## 2017-01-18 MED ORDER — MENTHOL 3 MG MT LOZG
1.0000 | LOZENGE | OROMUCOSAL | Status: DC | PRN
  Filled 2017-01-18: qty 9

## 2017-01-18 MED ORDER — MAGNESIUM CITRATE PO SOLN
1.0000 | Freq: Once | ORAL | Status: AC | PRN
  Administered 2017-01-20: 1 via ORAL
  Filled 2017-01-18 (×2): qty 296

## 2017-01-18 MED ORDER — METHYLPREDNISOLONE SODIUM SUCC 125 MG IJ SOLR
40.0000 mg | Freq: Four times a day (QID) | INTRAMUSCULAR | Status: AC
  Administered 2017-01-18 – 2017-01-19 (×6): 40 mg via INTRAVENOUS
  Filled 2017-01-18 (×6): qty 2

## 2017-01-18 MED ORDER — LATANOPROST 0.005 % OP SOLN
1.0000 [drp] | Freq: Every day | OPHTHALMIC | Status: DC
  Administered 2017-01-18 – 2017-01-20 (×3): 1 [drp] via OPHTHALMIC
  Filled 2017-01-18: qty 2.5

## 2017-01-18 MED ORDER — BISACODYL 10 MG RE SUPP: 10.0000 mg | Freq: Every day | RECTAL | Status: DC | PRN

## 2017-01-18 MED ORDER — SUCCINYLCHOLINE CHLORIDE 20 MG/ML IJ SOLN
INTRAMUSCULAR | Status: AC
  Filled 2017-01-18: qty 1

## 2017-01-18 MED ORDER — VITAMIN B-12 1000 MCG PO TABS
1000.0000 ug | ORAL_TABLET | Freq: Every day | ORAL | Status: DC
Start: 2017-01-18 — End: 2017-01-21
  Administered 2017-01-19 – 2017-01-21 (×3): 1000 ug via ORAL
  Filled 2017-01-18 (×3): qty 1

## 2017-01-18 MED ORDER — METHOCARBAMOL 1000 MG/10ML IJ SOLN
500.0000 mg | Freq: Four times a day (QID) | INTRAVENOUS | Status: DC | PRN
  Filled 2017-01-18: qty 5

## 2017-01-18 MED ORDER — PROMETHAZINE HCL 25 MG/ML IJ SOLN: 6.2500 mg | INTRAMUSCULAR | Status: DC | PRN

## 2017-01-18 MED ORDER — LACTATED RINGERS IV SOLN
INTRAVENOUS | Status: DC
  Administered 2017-01-18: 11:00:00 via INTRAVENOUS

## 2017-01-18 MED ORDER — ONDANSETRON HCL 4 MG/2ML IJ SOLN: 4.0000 mg | Freq: Four times a day (QID) | INTRAMUSCULAR | Status: DC | PRN

## 2017-01-18 MED ORDER — FLUTICASONE PROPIONATE 50 MCG/ACT NA SUSP
1.0000 | Freq: Every day | NASAL | Status: DC
  Administered 2017-01-18 – 2017-01-21 (×4): 1 via NASAL
  Filled 2017-01-18: qty 16

## 2017-01-18 MED ORDER — CEFAZOLIN SODIUM-DEXTROSE 2-4 GM/100ML-% IV SOLN
2.0000 g | Freq: Once | INTRAVENOUS | Status: AC
  Administered 2017-01-18: 2 g via INTRAVENOUS

## 2017-01-18 MED ORDER — OXYCODONE HCL 5 MG PO TABS
5.0000 mg | ORAL_TABLET | ORAL | Status: DC | PRN
  Administered 2017-01-18 – 2017-01-20 (×9): 10 mg via ORAL
  Administered 2017-01-20: 5 mg via ORAL
  Administered 2017-01-20 (×3): 10 mg via ORAL
  Filled 2017-01-18 (×13): qty 2

## 2017-01-18 MED ORDER — PHENOL 1.4 % MT LIQD
1.0000 | OROMUCOSAL | Status: DC | PRN
  Filled 2017-01-18: qty 177

## 2017-01-18 MED ORDER — ONDANSETRON HCL 4 MG/2ML IJ SOLN
INTRAMUSCULAR | Status: DC | PRN
  Administered 2017-01-18: 4 mg via INTRAVENOUS

## 2017-01-18 MED ORDER — PREDNISONE 10 MG PO TABS
10.0000 mg | ORAL_TABLET | Freq: Every day | ORAL | Status: DC
  Administered 2017-01-19 – 2017-01-21 (×3): 10 mg via ORAL
  Filled 2017-01-18 (×3): qty 1

## 2017-01-18 MED ORDER — METOCLOPRAMIDE HCL 10 MG PO TABS: 5.0000 mg | ORAL_TABLET | Freq: Three times a day (TID) | ORAL | Status: DC | PRN

## 2017-01-18 MED ORDER — BUPIVACAINE-EPINEPHRINE 0.25% -1:200000 IJ SOLN
INTRAMUSCULAR | Status: DC | PRN
  Administered 2017-01-18: 30 mL

## 2017-01-18 MED ORDER — ONDANSETRON HCL 4 MG/2ML IJ SOLN
INTRAMUSCULAR | Status: AC
  Filled 2017-01-18: qty 2

## 2017-01-18 MED ORDER — BUPIVACAINE-EPINEPHRINE (PF) 0.25% -1:200000 IJ SOLN
INTRAMUSCULAR | Status: AC
  Filled 2017-01-18: qty 30

## 2017-01-18 MED ORDER — DEXAMETHASONE SODIUM PHOSPHATE 10 MG/ML IJ SOLN
INTRAMUSCULAR | Status: DC | PRN
  Administered 2017-01-18: 10 mg via INTRAVENOUS

## 2017-01-18 MED ORDER — PHENYLEPHRINE HCL 10 MG/ML IJ SOLN
INTRAMUSCULAR | Status: AC
  Filled 2017-01-18: qty 1

## 2017-01-18 MED ORDER — FENTANYL CITRATE (PF) 100 MCG/2ML IJ SOLN
INTRAMUSCULAR | Status: DC | PRN
Start: 2017-01-18 — End: 2017-01-18
  Administered 2017-01-18 (×3): 50 ug via INTRAVENOUS
  Administered 2017-01-18: 100 ug via INTRAVENOUS

## 2017-01-18 MED ORDER — ACETAMINOPHEN 650 MG RE SUPP: 650.0000 mg | Freq: Four times a day (QID) | RECTAL | Status: DC | PRN

## 2017-01-18 MED ORDER — CEFAZOLIN IN D5W 1 GM/50ML IV SOLN
1.0000 g | Freq: Four times a day (QID) | INTRAVENOUS | Status: AC
  Administered 2017-01-18 – 2017-01-19 (×3): 1 g via INTRAVENOUS
  Filled 2017-01-18 (×3): qty 50

## 2017-01-18 MED ORDER — PROPOFOL 10 MG/ML IV BOLUS
INTRAVENOUS | Status: DC | PRN
  Administered 2017-01-18: 100 mg via INTRAVENOUS

## 2017-01-18 MED ORDER — DOCUSATE SODIUM 100 MG PO CAPS
100.0000 mg | ORAL_CAPSULE | Freq: Two times a day (BID) | ORAL | Status: DC
  Administered 2017-01-18 – 2017-01-21 (×6): 100 mg via ORAL
  Filled 2017-01-18 (×6): qty 1

## 2017-01-18 MED ORDER — PANTOPRAZOLE SODIUM 40 MG PO TBEC
40.0000 mg | DELAYED_RELEASE_TABLET | Freq: Every day | ORAL | Status: DC
  Administered 2017-01-19 – 2017-01-21 (×3): 40 mg via ORAL
  Filled 2017-01-18 (×3): qty 1

## 2017-01-18 MED ORDER — ACETAMINOPHEN 10 MG/ML IV SOLN
INTRAVENOUS | Status: DC | PRN
Start: 2017-01-18 — End: 2017-01-18
  Administered 2017-01-18: 1000 mg via INTRAVENOUS

## 2017-01-18 MED ORDER — ACETAMINOPHEN 10 MG/ML IV SOLN
INTRAVENOUS | Status: AC
  Filled 2017-01-18: qty 100

## 2017-01-18 MED ORDER — FENTANYL CITRATE (PF) 100 MCG/2ML IJ SOLN
25.0000 ug | INTRAMUSCULAR | Status: DC | PRN
  Administered 2017-01-18 (×2): 50 ug via INTRAVENOUS

## 2017-01-18 MED ORDER — METOCLOPRAMIDE HCL 5 MG/ML IJ SOLN: 5.0000 mg | Freq: Three times a day (TID) | INTRAMUSCULAR | Status: DC | PRN

## 2017-01-18 MED ORDER — BRIMONIDINE TARTRATE 0.2 % OP SOLN
1.0000 [drp] | Freq: Three times a day (TID) | OPHTHALMIC | Status: DC
  Administered 2017-01-18 – 2017-01-21 (×8): 1 [drp] via OPHTHALMIC
  Filled 2017-01-18: qty 5

## 2017-01-18 MED ORDER — TRANEXAMIC ACID 1000 MG/10ML IV SOLN
INTRAVENOUS | Status: AC
  Filled 2017-01-18: qty 10

## 2017-01-18 MED ORDER — NEOMYCIN-POLYMYXIN B GU 40-200000 IR SOLN
Status: AC
  Filled 2017-01-18: qty 4

## 2017-01-18 SURGICAL SUPPLY — 44 items
BLADE SAW SAG 18.5X105 (BLADE) ×3 IMPLANT
BNDG COHESIVE 6X5 TAN STRL LF (GAUZE/BANDAGES/DRESSINGS) ×6 IMPLANT
CANISTER SUCT 1200ML W/VALVE (MISCELLANEOUS) ×3 IMPLANT
CAPT HIP TOTAL 3 ×3 IMPLANT
CATH FOL LEG HOLDER (MISCELLANEOUS) ×3 IMPLANT
CATH TRAY METER 16FR LF (MISCELLANEOUS) ×3 IMPLANT
CHLORAPREP W/TINT 26ML (MISCELLANEOUS) ×3 IMPLANT
DRAPE C-ARM XRAY 36X54 (DRAPES) ×3 IMPLANT
DRAPE INCISE IOBAN 66X60 STRL (DRAPES) IMPLANT
DRAPE POUCH INSTRU U-SHP 10X18 (DRAPES) ×3 IMPLANT
DRAPE SHEET LG 3/4 BI-LAMINATE (DRAPES) ×9 IMPLANT
DRAPE TABLE BACK 80X90 (DRAPES) ×3 IMPLANT
DRSG OPSITE POSTOP 4X8 (GAUZE/BANDAGES/DRESSINGS) ×6 IMPLANT
ELECT BLADE 6.5 EXT (BLADE) ×3 IMPLANT
ELECT REM PT RETURN 9FT ADLT (ELECTROSURGICAL) ×3
ELECTRODE REM PT RTRN 9FT ADLT (ELECTROSURGICAL) ×1 IMPLANT
GLOVE BIOGEL PI IND STRL 9 (GLOVE) ×1 IMPLANT
GLOVE BIOGEL PI INDICATOR 9 (GLOVE) ×2
GLOVE SURG SYN 9.0  PF PI (GLOVE) ×4
GLOVE SURG SYN 9.0 PF PI (GLOVE) ×2 IMPLANT
GOWN SRG 2XL LVL 4 RGLN SLV (GOWNS) ×1 IMPLANT
GOWN STRL NON-REIN 2XL LVL4 (GOWNS) ×2
GOWN STRL REUS W/ TWL LRG LVL3 (GOWN DISPOSABLE) ×1 IMPLANT
GOWN STRL REUS W/TWL LRG LVL3 (GOWN DISPOSABLE) ×2
HEMOVAC 400CC 10FR (MISCELLANEOUS) IMPLANT
HOOD PEEL AWAY FLYTE STAYCOOL (MISCELLANEOUS) ×3 IMPLANT
MAT BLUE FLOOR 46X72 FLO (MISCELLANEOUS) ×3 IMPLANT
NDL SAFETY 18GX1.5 (NEEDLE) ×3 IMPLANT
NEEDLE SPNL 18GX3.5 QUINCKE PK (NEEDLE) ×3 IMPLANT
NS IRRIG 1000ML POUR BTL (IV SOLUTION) ×3 IMPLANT
PACK HIP COMPR (MISCELLANEOUS) ×3 IMPLANT
SOL PREP PVP 2OZ (MISCELLANEOUS) ×3
SOLUTION PREP PVP 2OZ (MISCELLANEOUS) ×1 IMPLANT
STAPLER SKIN PROX 35W (STAPLE) ×3 IMPLANT
STRAP SAFETY BODY (MISCELLANEOUS) ×3 IMPLANT
SUT DVC 2 QUILL PDO  T11 36X36 (SUTURE) ×2
SUT DVC 2 QUILL PDO T11 36X36 (SUTURE) ×1 IMPLANT
SUT SILK 0 (SUTURE) ×2
SUT SILK 0 30XBRD TIE 6 (SUTURE) ×1 IMPLANT
SUT V-LOC 90 ABS DVC 3-0 CL (SUTURE) ×3 IMPLANT
SUT VIC AB 1 CT1 36 (SUTURE) ×6 IMPLANT
SYR 20CC LL (SYRINGE) ×3 IMPLANT
SYR 30ML LL (SYRINGE) ×3 IMPLANT
TOWEL OR 17X26 4PK STRL BLUE (TOWEL DISPOSABLE) ×3 IMPLANT

## 2017-01-18 NOTE — Anesthesia Procedure Notes (Signed)
Procedure Name: Intubation Date/Time: 01/18/2017 2:00 PM Performed by: Nelda Marseille Pre-anesthesia Checklist: Patient identified, Patient being monitored, Timeout performed, Emergency Drugs available and Suction available Patient Re-evaluated:Patient Re-evaluated prior to inductionOxygen Delivery Method: Circle system utilized Preoxygenation: Pre-oxygenation with 100% oxygen Intubation Type: IV induction Ventilation: Mask ventilation without difficulty Laryngoscope Size: Mac, 4 and McGraph Grade View: Grade II Tube type: Oral Tube size: 7.0 mm Number of attempts: 1 Airway Equipment and Method: Stylet and Video-laryngoscopy Placement Confirmation: ETT inserted through vocal cords under direct vision,  positive ETCO2 and breath sounds checked- equal and bilateral Secured at: 21 cm Tube secured with: Tape Dental Injury: Teeth and Oropharynx as per pre-operative assessment

## 2017-01-18 NOTE — Anesthesia Post-op Follow-up Note (Cosign Needed)
Anesthesia QCDR form completed.        

## 2017-01-18 NOTE — Op Note (Signed)
01/18/2017  2:34 PM  PATIENT:  Hannah Kennedy  31 y.o. female  PRE-OPERATIVE DIAGNOSIS:  avascular necrosis of bone of right hip  POST-OPERATIVE DIAGNOSIS:  avascular necrosis of bone of right hip  PROCEDURE:  Procedure(s): TOTAL HIP ARTHROPLASTY ANTERIOR APPROACH (Right)  SURGEON: Leitha SchullerMichael J Robie Oats, MD  ASSISTANTS: none  ANESTHESIA:   general  EBL:  Total I/O In: 600 [I.V.:600] Out: 700 [Urine:200; Blood:500]  BLOOD ADMINISTERED:none  DRAINS: (2) Hemovact drain(s) in the subcutaneous with  Suction Open   LOCAL MEDICATIONS USED:  MARCAINE    and OTHER TXA  SPECIMEN:  Source of Specimen:  Right femoral head showing avascular necrosis  DISPOSITION OF SPECIMEN:  PATHOLOGY  COUNTS:  YES  TOURNIQUET:  * No tourniquets in log *  IMPLANTS: AMIS 1 std, 46 mm Mpact cup DM with liner, 22.2 mm haed, size S  DICTATION: .Dragon Dictation  The patient was brought to the operating room and after spinal anesthesia was obtained patient was placed on the operative table with the ipsilateral foot into the Medacta attachment, contralateral leg on a well-padded table. C-arm was brought in and preop template x-ray taken. After prepping and draping in usual sterile fashion appropriate patient identification and timeout procedures were completed. Anterior approach to the hip was obtained and centered over the greater trochanter and TFL muscle. The subcutaneous tissue was incised hemostasis being achieved by electrocautery. TFL fascia was incised and the muscle retracted laterally deep retractor placed. The lateral femoral circumflex vessels were identified and ligated. The anterior capsule was exposed and a capsulotomy performed. The neck was identified and a femoral neck cut carried out with a saw. The head was removed without difficulty and showed sclerotic femoral head and acetabulum. Reaming was carried out to 44 mm and a 46 mm cup trial gave appropriate tightness to the acetabular component a 46 cup  was impacted into position. The leg was then externally rotated and ischiofemoral and pubofemoral releases carried out. The femur was sequentially broached to a size 1, size 1 standard trials were placed and the final components chosen. The 1 standar stem was inserted along with a S 22.2 mm head and 46 mm liner. The hip was reduced and was stable the wound was thoroughly irrigated with a dilute Betadine solution. The deep fascia closed Using a heavy Quill after infiltration of 30 cc of quarter percent Sensorcaine with epinephrine. Subcutaneous drains were then inserted. 3-0 V-locl to close the skin with skin staples with Provena applied  PLAN OF CARE: Admit to inpatient

## 2017-01-18 NOTE — Transfer of Care (Signed)
Immediate Anesthesia Transfer of Care Note  Patient: Hannah DowseLatoya D Tatro  Procedure(s) Performed: Procedure(s): TOTAL HIP ARTHROPLASTY ANTERIOR APPROACH (Right)  Patient Location: PACU  Anesthesia Type:General  Level of Consciousness: sedated  Airway & Oxygen Therapy: Patient Spontanous Breathing and Patient connected to face mask oxygen  Post-op Assessment: Report given to RN and Post -op Vital signs reviewed and stable  Post vital signs: Reviewed and stable  Last Vitals:  Vitals:   01/18/17 1054  BP: 117/79  Pulse: (!) 107  Resp: 18  Temp: 36.8 C    Last Pain:  Vitals:   01/18/17 1054  TempSrc: Tympanic  PainSc: 8          Complications: No apparent anesthesia complications

## 2017-01-18 NOTE — Anesthesia Preprocedure Evaluation (Signed)
Anesthesia Evaluation  Patient identified by MRN, date of birth, ID band Patient awake    Reviewed: Allergy & Precautions, H&P , NPO status , Patient's Chart, lab work & pertinent test results, reviewed documented beta blocker date and time   History of Anesthesia Complications Negative for: history of anesthetic complications  Airway Mallampati: III  TM Distance: >3 FB Neck ROM: full    Dental  (+) Teeth Intact   Pulmonary neg pulmonary ROS,           Cardiovascular Exercise Tolerance: Good (-) angina(-) CAD, (-) Past MI, (-) Cardiac Stents and (-) CABG (-) dysrhythmias + Valvular Problems/Murmurs (s/p repair)      Neuro/Psych PSYCHIATRIC DISORDERS (Schizophrenia and depression) Cerebral palsy with right sided hemiparesis    GI/Hepatic Neg liver ROS, GERD  ,  Endo/Other  negative endocrine ROS  Renal/GU negative Renal ROS  negative genitourinary   Musculoskeletal   Abdominal   Peds  Hematology negative hematology ROS (+)   Anesthesia Other Findings Past Medical History: No date: Arthritis No date: Cerebral palsy (HCC) No date: Depression No date: GERD (gastroesophageal reflux disease) No date: Glaucoma No date: Hemiparesis (HCC)     Comment: right side 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) No date: Pulmonary emboli (HCC) No date: Schizophrenia (HCC)   Reproductive/Obstetrics negative OB ROS                             Anesthesia Physical Anesthesia Plan  ASA: II  Anesthesia Plan: General   Post-op Pain Management:    Induction:   Airway Management Planned:   Additional Equipment:   Intra-op Plan:   Post-operative Plan:   Informed Consent: I have reviewed the patients History and Physical, chart, labs and discussed the procedure including the risks, benefits and alternatives for the proposed anesthesia with the patient or authorized representative who has indicated  his/her understanding and acceptance.   Dental Advisory Given  Plan Discussed with: Anesthesiologist, CRNA and Surgeon  Anesthesia Plan Comments:         Anesthesia Quick Evaluation

## 2017-01-18 NOTE — Progress Notes (Signed)
Pt arrived from the OR via bed at approx 1545. Pt is drowsy, accompanied by family members. o[facility] on at [facility] due to pain medication given in pacu. Lungs are clear bilat, HR is regular, abdomen is soft, bs heard. Pt has provena wound vac intact to r hip, and has hemovac intact to her R lateral thigh, bloody drainage noted. Ice has been applied ot the hip area. Foley draining clear yellow urine,ppp, teds on bilat and foot pumps. Pt had sacral foam dressing placed on arrival, began clear liquids. When asked about pain, pt stated "it hurts so bad". Pt received moprhine ivp for pain. PIV #20 intact to l fa, iv ns hung at 7375mls/hr per order, site is free of redness and swelling. This writer explained pain medication available for pt and dietary advance. Pt is oriented to room and call bell, family member is intending to stay with pt.

## 2017-01-18 NOTE — NC FL2 (Signed)
[place] MEDICAID FL2 LEVEL OF CARE SCREENING TOOL     IDENTIFICATION  Patient Name: Hannah Kennedy Birthdate: 1986/09/15 Sex: female Admission Date (Current Location): 01/18/2017  [facility] and [place] Number:  [address]  ([id number]) Facility and Address:  [facility], [address] [place], [place] [address]      Provider Number: [id number]  Attending Physician Name and Address:  Kennedy Bucker, MD  Relative Name and Phone Number:       Current Level of Care: Hospital Recommended Level of Care: Skilled Nursing Facility Prior Approval Number:    Date Approved/Denied:   PASRR Number:  ([id number])  Discharge Plan: SNF    Current Diagnoses: Patient Active Problem List   Diagnosis Date Noted  . 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 Cerebral palsy (HCC) 01/04/2017  . Schizophrenia (HCC) 01/04/2017  . Avascular necrosis of bone of hip, right (HCC) 01/04/2017  . Hx of pulmonary embolus 01/04/2017    Orientation RESPIRATION BLADDER Height & Weight     Self, Time, Situation, Place  O2 (2 Liters Oxygen ) Continent Weight:   Height:     BEHAVIORAL SYMPTOMS/MOOD NEUROLOGICAL BOWEL NUTRITION STATUS   (none)  (none) Continent Diet (Diet: Soft )  AMBULATORY STATUS COMMUNICATION OF NEEDS Skin   Extensive Assist Verbally Surgical wounds, Wound Vac (Incision: right hip & provena wound vac. )                       Personal Care Assistance Level of Assistance  Bathing, Feeding, Dressing Bathing Assistance: Limited assistance Feeding assistance: Independent Dressing Assistance: Limited assistance     Functional Limitations Info  Sight, Hearing, Speech Sight Info: Adequate Hearing Info: Adequate Speech Info: Adequate    SPECIAL CARE FACTORS FREQUENCY  PT (By licensed PT), OT (By licensed OT)     PT Frequency:  (3-4) OT Frequency:  (3-4)            Contractures      Additional Factors Info  Code Status,  Allergies Code Status Info:  (Full Code. ) Allergies Info:  (Sulfamethoxazole-trimethoprim)           Current Medications (01/18/2017):  This is the current hospital active medication list Current Facility-Administered Medications  Medication Dose Route Frequency Provider Last Rate Last Dose  . 0.9 %  sodium chloride infusion   Intravenous Continuous Kennedy Bucker, MD 75 mL/hr at 01/18/17 1621    . acetaminophen (TYLENOL) tablet 650 mg  650 mg Oral Q6H PRN Kennedy Bucker, MD       Or  . acetaminophen (TYLENOL) suppository 650 mg  650 mg Rectal Q6H PRN Kennedy Bucker, MD      . bisacodyl (DULCOLAX) suppository 10 mg  10 mg Rectal Daily PRN Kennedy Bucker, MD      . brimonidine (ALPHAGAN) 0.2 % ophthalmic solution 1 drop  1 drop Both Eyes TID Kennedy Bucker, MD      . ceFAZolin (ANCEF) IVPB 1 g/50 mL premix  1 g Intravenous Q6H Kennedy Bucker, MD      . Melene Muller ON 01/19/2017] chlorproMAZINE (THORAZINE) tablet 100 mg  100 mg Oral Caffie Pinto, MD      . docusate sodium (COLACE) capsule 100 mg  100 mg Oral BID Kennedy Bucker, MD      . Melene Muller ON 01/19/2017] enoxaparin (LOVENOX) injection 40 mg  40 mg Subcutaneous Q24H Kennedy Bucker, MD      . fluticasone [facility]) 50 MCG/ACT nasal spray  1 spray  1 spray Each Nare Daily Kennedy BuckerMichael Menz, MD      . latanoprost (XALATAN) 0.005 % ophthalmic solution 1 drop  1 drop Both Eyes QHS Kennedy BuckerMichael Menz, MD      . magnesium citrate solution 1 Bottle  1 Bottle Oral Once PRN Kennedy BuckerMichael Menz, MD      . magnesium hydroxide (MILK OF MAGNESIA) suspension 30 mL  30 mL Oral Daily PRN Kennedy BuckerMichael Menz, MD      . menthol-cetylpyridinium (CEPACOL) lozenge 3 mg  1 lozenge Oral PRN Kennedy BuckerMichael Menz, MD       Or  . phenol (CHLORASEPTIC) mouth spray 1 spray  1 spray Mouth/Throat PRN Kennedy BuckerMichael Menz, MD      . methocarbamol (ROBAXIN) tablet 500 mg  500 mg Oral Q6H PRN Kennedy BuckerMichael Menz, MD       Or  . methocarbamol (ROBAXIN) 500 mg in dextrose 5 % 50 mL IVPB  500 mg Intravenous Q6H PRN Kennedy BuckerMichael Menz, MD       . methylPREDNISolone sodium succinate (SOLU-MEDROL) 125 mg/2 mL injection 40 mg  40 mg Intravenous Q6H Kennedy BuckerMichael Menz, MD   40 mg at 01/18/17 1622  . metoCLOPramide (REGLAN) tablet 5-10 mg  5-10 mg Oral Q8H PRN Kennedy BuckerMichael Menz, MD       Or  . metoCLOPramide [facility](REGLAN) injection 5-10 mg  5-10 mg Intravenous Q8H PRN Kennedy BuckerMichael Menz, MD      . morphine 2 MG/ML injection 2 mg  2 mg Intravenous Q1H PRN Kennedy BuckerMichael Menz, MD   2 mg at 01/18/17 1621  . ondansetron (ZOFRAN) tablet 4 mg  4 mg Oral Q6H PRN Kennedy BuckerMichael Menz, MD       Or  . ondansetron [facility](ZOFRAN) injection 4 mg  4 mg Intravenous Q6H PRN Kennedy BuckerMichael Menz, MD      . oxyCODONE (Oxy IR/ROXICODONE) immediate release tablet 5-10 mg  5-10 mg Oral Q3H PRN Kennedy BuckerMichael Menz, MD      . pantoprazole (PROTONIX) EC tablet 40 mg  40 mg Oral Daily Kennedy BuckerMichael Menz, MD      . Melene Muller[START ON 01/19/2017] predniSONE (DELTASONE) tablet 10 mg  10 mg Oral Q breakfast Kennedy BuckerMichael Menz, MD      . Melene Muller[START ON 01/19/2017] sertraline (ZOLOFT) tablet 50 mg  50 mg Oral QHS Kennedy BuckerMichael Menz, MD      . timolol (TIMOPTIC) 0.25 % ophthalmic solution 1 drop  1 drop Both Eyes Daily Kennedy BuckerMichael Menz, MD      . vitamin B-12 (CYANOCOBALAMIN) tablet 1,000 mcg  1,000 mcg Oral Daily Kennedy BuckerMichael Menz, MD         Discharge Medications: Please see discharge summary for a list of discharge medications.  Relevant Imaging Results:  Relevant Lab Results:   Additional Information  (SSN: [id number])  Alisen Marsiglia, Darleen CrockerBailey M, LCSW

## 2017-01-18 NOTE — H&P (Signed)
Reviewed paper H+P, will be scanned into chart. Patient examined, no changes noted.

## 2017-01-19 ENCOUNTER — Encounter: Payer: Self-pay | Admitting: Orthopedic Surgery

## 2017-01-19 LAB — CBC
HCT: 36.9 % (ref 35.0–47.0)
Hemoglobin: 12.6 g/dL (ref 12.0–16.0)
MCH: 29 pg (ref 26.0–34.0)
MCHC: 34.1 g/dL (ref 32.0–36.0)
MCV: 85.1 fL (ref 80.0–100.0)
PLATELETS: 237 10*3/uL (ref 150–440)
RBC: 4.34 MIL/uL (ref 3.80–5.20)
RDW: 13.6 % (ref 11.5–14.5)
WBC: 15.2 10*3/uL — AB (ref 3.6–11.0)

## 2017-01-19 LAB — BASIC METABOLIC PANEL
Anion gap: 9 (ref 5–15)
BUN: 7 mg/dL (ref 6–20)
CALCIUM: 9.4 mg/dL (ref 8.9–10.3)
CHLORIDE: 103 mmol/L (ref 101–111)
CO2: 25 mmol/L (ref 22–32)
CREATININE: 0.62 mg/dL (ref 0.44–1.00)
Glucose, Bld: 146 mg/dL — ABNORMAL HIGH (ref 65–99)
Potassium: 4.4 mmol/L (ref 3.5–5.1)
SODIUM: 137 mmol/L (ref 135–145)

## 2017-01-19 NOTE — Evaluation (Signed)
Physical Therapy Evaluation Patient Details Name: Hannah Kennedy MRN: [id number] DOB: July 03, 1986 Today's Date: 01/19/2017   History of Present Illness  admitted for acute hospitalization status post R THR (01/18/17) secondary to AVN, WBAT, anterior THPs.  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  Clinical Impression  Upon evaluation, patient alert and oriented to basic information; follows all commands and demonstrates fair insight into deficits.  Significant R hemiparesis (UE > LE) due to history of CP, though reports ability to grasp and negotiate 4WRW prior to admission.  Significant flexor synergy/positiong apparent both at rest and with exertion.  Increase in R hip adduct/IR tone with exertion and transitional movements; suspect some degree of R ankle PF contracture (difficult to formally measure due to limited tolerance to stretching/passive range due to pain).  Currently requiring mod assist for bed mobility; mod assist with RW for sit/stand, constant cuing for postural extension and R LE WBing (unable to achieve full extension); lateral scoot pivot transfer, bed/chair over level surfaces, mod assist.  Additional upright mobility progression deferred secondary to symptomatic orthostasis (seated BP 80/48).  Will continue to assess/progress as medically appropriate. Patient does require increased encouragement from therapist, but responds well to cuing and demonstrates good effort/motivation with all mobility tasks. Would benefit from skilled PT to address above deficits and promote optimal return to PLOF; recommend transition to acute inpatient rehab upon discharge for high-intensity, post-acute rehab services.      Follow Up Recommendations CIR    Equipment Recommendations       Recommendations for Other Services       Precautions / Restrictions Precautions Precautions: Fall;Anterior  Hip Precaution Comments: R hip wound vac Restrictions Weight Bearing Restrictions: Yes RLE Weight Bearing: Weight bearing as tolerated      Mobility  Bed Mobility Overal bed mobility: Needs Assistance Bed Mobility: Supine to Sit     Supine to sit: Mod assist     General bed mobility comments: assist for LE management and truncal elevation with sup/sit transition towards R  Transfers Overall transfer level: Needs assistance Equipment used: Rolling walker (2 wheeled) Transfers: Sit to/from Stand Sit to Stand: Mod assist         General transfer comment: very slow and guarded, excessive weight shift to L LE.  Increased R LE adduct/IR tone with transitional movements.  Mod assist for postural extension as tolerated.  Ambulation/Gait             General Gait Details: unsafe/unable to tolerate due to pain, orthostatic hypotension. Per patient report, was able to maintain adequate grasp on 4WRW prior to admission; will try RW in subsequent sessions, but may be appropriate for trial of R PFRW?  Stairs            Wheelchair Mobility    Modified Rankin (Stroke Patients Only)       Balance Overall balance assessment: Needs assistance Sitting-balance support: No upper extremity supported;Feet supported Sitting balance-Leahy Scale: Good     Standing balance support: Bilateral upper extremity supported Standing balance-Leahy Scale: Fair                               Pertinent Vitals/Pain Pain Assessment: Faces Faces Pain Scale: Hurts even more Pain Location: R hip Pain Descriptors / Indicators: Aching;Grimacing;Guarding Pain Intervention(s): Limited activity within patient's tolerance;Monitored during session;Repositioned    Home Living Family/patient expects to be discharged  to:: Group home                 Additional Comments: Group home is single level with ramp entry    Prior Function Level of Independence: Needs assistance          Comments: Assist for ADLs (recently sponge bathing primarily); mod indep for household mobility (recent use of 4WRW due to R hip pain).  Does endorse 2-3 falls in previous six months related to R hip pain.     Hand Dominance        Extremity/Trunk Assessment   Upper Extremity Assessment Upper Extremity Assessment:  (Significant R hemiparesis, maintains in gross flexor synergy/position.  Able to actively mobilize shoulder, flex/extend R elbow and demonstrates fair gross grasp (limited/no active wrist extension))    Lower Extremity Assessment Lower Extremity Assessment:  (R LE grossly 3-/5 at hip and knee, R ankle DF grossly 1/5 with suspected R PF contracture (patient with limted tolerance of stretching, passive range due to pain).  L LE grossly 4-/5 throughout)       Communication   Communication: No difficulties  Cognition Arousal/Alertness: Awake/alert Behavior During Therapy: WFL for tasks assessed/performed Overall Cognitive Status: Within Functional Limits for tasks assessed                      General Comments      Exercises Other Exercises Other Exercises: Sit/stand x3 with RW, mod assist +1 with emphasis on postural extension, R LE WBing as tolerated.  Unable to achieve full postural extension due to pain. Other Exercises: Lateral scoot pivot transfer, bed/chair over level surfaces, mod assist for forward weight shift, lift off and lateral movement.   Assessment/Plan    PT Assessment Patient needs continued PT services  PT Problem List Decreased strength;Decreased range of motion;Decreased activity tolerance;Decreased balance;Decreased mobility;Decreased coordination;Decreased knowledge of use of DME;Decreased safety awareness;Decreased knowledge of precautions;Pain;Decreased skin integrity       PT Treatment Interventions DME instruction;Gait training;Functional mobility training;Therapeutic activities;Therapeutic exercise;Balance training;Neuromuscular  re-education;Patient/family education    PT Goals (Current goals can be found in the Care Plan section)  Acute Rehab PT Goals Patient Stated Goal: to start walking again PT Goal Formulation: With patient/family Time For Goal Achievement: 02/02/17 Potential to Achieve Goals: Good    Frequency BID   Barriers to discharge Decreased caregiver support      Co-evaluation               End of Session Equipment Utilized During Treatment: Gait belt Activity Tolerance: Treatment limited secondary to medical complications (Comment) (symptomatic orthostasis) Patient left: in chair;with call bell/phone within reach;with chair alarm set;with family/visitor present;with nursing/sitter in room Nurse Communication: Mobility status PT Visit Diagnosis: Pain;Difficulty in walking, not elsewhere classified (R26.2);Muscle weakness (generalized) (M62.81) Pain - Right/Left: Right Pain - part of body: Hip         Time: 0852-0920 PT Time Calculation (min) (ACUTE ONLY): 28 min   Charges:   PT Evaluation $PT Eval Moderate Complexity: 1 Procedure PT Treatments $Therapeutic Activity: 8-22 mins   PT G Codes:         Tyshaun Vinzant H. Manson PasseyBrown, PT, DPT, NCS 01/19/17, 10:01 AM [phone]

## 2017-01-19 NOTE — Progress Notes (Signed)
   Subjective: 1 Day Post-Op Procedure(s) (LRB): TOTAL HIP ARTHROPLASTY ANTERIOR APPROACH (Right) Patient reports pain as mild.   Patient is well, and has had no acute complaints or problems Denies any CP, SOB, ABD pain. We will start therapy today.    Objective: Vital signs in last 24 hours: Temp:  [96.8 F (36 C)-98.4 F (36.9 C)] 98.3 F (36.8 C) (02/28 0802) Pulse Rate:  [81-122] 81 (02/28 0802) Resp:  [13-19] 16 (02/28 0802) BP: (108-137)/(61-84) 108/61 (02/28 0802) SpO2:  [94 %-100 %] 96 % (02/28 0802)  Intake/Output from previous day: 02/27 0701 - 02/28 0700 In: 2046.3 [P.O.:240; I.V.:1706.3; IV Piggyback:100] Out: 2725 [Urine:2080; Drains:145; Blood:500] Intake/Output this shift: No intake/output data recorded.   Recent Labs  01/18/17 1625 01/19/17 0419  HGB 13.2 12.6    Recent Labs  01/18/17 1625 01/19/17 0419  WBC 20.7* 15.2*  RBC 4.54 4.34  HCT 39.1 36.9  PLT 219 237    Recent Labs  01/18/17 1625 01/19/17 0419  NA  --  137  K  --  4.4  CL  --  103  CO2  --  25  BUN  --  7  CREATININE 0.61 0.62  GLUCOSE  --  146*  CALCIUM  --  9.4   No results for input(s): LABPT, INR in the last 72 hours.  EXAM General - Patient is Alert, Appropriate and Oriented Extremity - Neurovascular intact Sensation intact distally Intact pulses distally Dorsiflexion/Plantar flexion intact No cellulitis present Compartment soft Dressing - dressing C/D/I and no drainage, wound vac and hemovac intact Motor Function - intact, moving foot and toes well on exam.   Past Medical History:  Diagnosis Date  . Arthritis   . Cerebral palsy (HCC)   . Depression   . GERD (gastroesophageal reflux disease)   . Glaucoma   . Hemiparesis (HCC)    right side  . 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 Pulmonary emboli (HCC)   . Schizophrenia (HCC)     Assessment/Plan:   1 Day Post-Op Procedure(s) (LRB): TOTAL HIP ARTHROPLASTY ANTERIOR APPROACH (Right) Active Problems:   Drug-induced osteonecrosis of femur, right (HCC)  Estimated body mass index is 24.69 kg/m as calculated from the following:   Height as of 01/14/17:  (1.575 m).   Weight as of 01/14/17: 61.2 kg (135 lb). Advance diet Up with therapy  Needs BM Recheck labs in the am CM to assist with discharge  DVT Prophylaxis - Lovenox, Foot Pumps and TED hose Weight-Bearing as tolerated to right leg   T. Cranston Neighbor, PA-C [facility] 01/19/2017, 8:26 AM

## 2017-01-19 NOTE — Progress Notes (Signed)
Physical Therapy Treatment Patient Details Name: Hannah Kennedy MRN: [id number] DOB: [birth year] Today's Date: 01/19/2017    History of Present Illness admitted for acute hospitalization status post R THR (01/18/17) secondary to AVN, WBAT, anterior THPs.  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    PT Comments    Patient eager for standing/gait attempts, but demonstrates significant limitations in ability to actively accept weight through R LE (limited by pain and baseline hemiparesis).  Max assist +2 required for safety throughout all standing/stepping trials.  PErformance improved with use of R PFRW; recommend continued use of this device for all subsequent trials.    Follow Up Recommendations  CIR     Equipment Recommendations       Recommendations for Other Services       Precautions / Restrictions Precautions Precautions: Fall;Anterior Hip Precaution Comments: R hip wound vac Restrictions Weight Bearing Restrictions: Yes RLE Weight Bearing: Weight bearing as tolerated    Mobility  Bed Mobility Overal bed mobility: Needs Assistance Bed Mobility: Supine to Sit     Supine to sit: Mod assist     General bed mobility comments: assist for LE management and truncal elevation with sup/sit transition towards R  Transfers Overall transfer level: Needs assistance Equipment used: Right platform walker Transfers: Sit to/from Stand Sit to Stand: Mod assist;+2 safety/equipment;+2 physical assistance         General transfer comment: heavy weight shift to L LE; limited active use of R LE.  Dep for position of R UE into platform device  Ambulation/Gait Ambulation/Gait assistance: Max assist;+2 physical assistance Ambulation Distance (Feet): 4 Feet Assistive device: Right platform walker       General Gait Details: step to gait pattern with limited R quad activation, poor R  knee control/stability; freqeunt buckling during stance attempts, requiring max/dep assist from therapist to remain upright   Stairs            Wheelchair Mobility    Modified Rankin (Stroke Patients Only)       Balance Overall balance assessment: Needs assistance Sitting-balance support: No upper extremity supported;Feet supported Sitting balance-Leahy Scale: Good     Standing balance support: Bilateral upper extremity supported Standing balance-Leahy Scale: Poor                      Cognition Arousal/Alertness: Awake/alert Behavior During Therapy: WFL for tasks assessed/performed Overall Cognitive Status: Within Functional Limits for tasks assessed                      Exercises Other Exercises Other Exercises: Partial sit/stand from edge of bed surface, 1x6, mod assist +1 for lift off.  Poor active use of R LE, heavy weight shift to L LE. Other Exercises: Sit/stand x2 with R PFRW, mod assist +2 with emphasis on midline orientation (in M/L plane) and postural    General Comments        Pertinent Vitals/Pain Pain Assessment: Faces Faces Pain Scale: Hurts even more Pain Location: R hip Pain Descriptors / Indicators: Aching;Grimacing;Guarding Pain Intervention(s): Limited activity within patient's tolerance;Monitored during session;Repositioned    Home Living Family/patient expects to be discharged to:: Group home                    Prior Function            PT Goals (current goals can now be found in the care  plan section) Acute Rehab PT Goals Patient Stated Goal: "to get up and do more again" PT Goal Formulation: With patient/family Time For Goal Achievement: 02/02/17 Progress towards PT goals: Progressing toward goals    Frequency    BID      PT Plan Current plan remains appropriate    Co-evaluation             End of Session Equipment Utilized During Treatment: Gait belt Activity Tolerance: Patient tolerated  treatment well;Patient limited by pain Patient left: in chair;with call bell/phone within reach;with family/visitor present (OT present in room for completion of OT evaluation) Nurse Communication: Mobility status PT Visit Diagnosis: Pain;Difficulty in walking, not elsewhere classified (R26.2);Muscle weakness (generalized) (M62.81) Pain - Right/Left: Right Pain - part of body: Hip     Time: [id number] PT Time Calculation (min) (ACUTE ONLY): 32 min  Charges:  $Therapeutic Activity: 23-37 mins                    G Codes:       Towanda Hornstein H. Manson PasseyBrown, PT, DPT, NCS 01/19/17, 10:52 PM [phone]

## 2017-01-19 NOTE — Care Management Note (Signed)
Case Management Note  Patient Details  Name: Hannah Kennedy MRN: [id number] Date of Birth: [birth year]  Subjective/Objective:                  I spoke with Hannah Kennedy  [phone] and she wanted me to talk to [facility] adult family care attendant [phone]. He wants to see patient work with PT this afternoon but both agree to pursue CIR. Patient was limited at baseline- wheelchair bound- two assist to toilet. 10% ambulatory. 15-20 minutes was challenging enough per Hannah Kennedy. No preference for home health PT if she returns to home.  [facility]. CSW following.  PCP Dr. Meribeth MattesHedrick Kennedy.  Action/Plan: Referral to CIR (see previous RNCM note). Spoke again to Hannah Kennedy with CIR - they will follow.  Expected Discharge Date:                  Expected Discharge Plan:     In-House Referral:     Discharge planning Services  CM Consult  Post Acute Care Choice:   (CIR referral) [facility]  DME Arranged:    DME Agency:     HH Arranged:    HH Agency:     Status of Service:  In process, will continue to follow  If discussed at Long Length of Stay Meetings, dates discussed:    Additional Comments:  Hannah Siadngela Morine Kohlman, RN 01/19/2017, 1:26 PM

## 2017-01-19 NOTE — Anesthesia Postprocedure Evaluation (Signed)
Anesthesia Post Note  Patient: Hannah Kennedy  Procedure(s) Performed: Procedure(s) (LRB): TOTAL HIP ARTHROPLASTY ANTERIOR APPROACH (Right)  Patient location during evaluation: PACU Anesthesia Type: General Level of consciousness: awake and alert Pain management: pain level controlled Vital Signs Assessment: post-procedure vital signs reviewed and stable Respiratory status: spontaneous breathing, nonlabored ventilation, respiratory function stable and patient connected to nasal cannula oxygen Cardiovascular status: blood pressure returned to baseline and stable Postop Assessment: no signs of nausea or vomiting Anesthetic complications: no     Last Vitals:  Vitals:   01/19/17 0932 01/19/17 1023  BP: (!) 80/48 (!) 104/49  Pulse:  86  Resp:    Temp:      Last Pain:  Vitals:   01/19/17 1124  TempSrc:   PainSc: Asleep                 Lenard SimmerAndrew Willian Donson

## 2017-01-19 NOTE — Care Management (Signed)
CIR referral per PT evaluation. [phone] and she will review.

## 2017-01-19 NOTE — Clinical Social Work Note (Signed)
Clinical Social Work Assessment  Patient Details  Name: Hannah Kennedy MRN: [id number] Date of Birth: 03/19/1986  Date of referral:  01/19/17               Reason for consult:  Facility Placement                Permission sought to share information with:    Permission granted to share information::     Name::      Hannah Kennedy::     Relationship::     Contact Information:     Housing/Transportation Living arrangements for the past 2 months:  [place] of Information:  Patient, Parent, Facility, Other (Comment Required), Guardian (Grandmother/ Guardian Hannah Kennedy) Patient Interpreter Needed:  None Criminal Activity/Legal Involvement Pertinent to Current Situation/Hospitalization:  No - Comment as needed Significant Relationships:  Parents, Other Family Members Lives with:  Facility Resident Do you feel safe going back to the place where you live?  Yes Need for family participation in patient care:  Yes (Comment)  Care giving concerns:  Patient is a long term care resident at [facility] ALF/ group home.    Social Worker assessment / plan:  Holiday representative (CSW) received SNF consult. PT is recommending CIR. CSW met with patient and her mother Hannah Kennedy [phone] was at bedside. Patient was oriented X4 and was sitting up in the chair eating breakfast. Patient reported that she has lived at [facility] for a little over 1 year now. Per patient she receives SSI/ disability and the group home owner Hannah Kennedy [phone] handles her fiances and has control of her check. Per mother patient's grandmother Hannah Kennedy [phone] is patient's guardian. Per mother patient was born with cereberal palsy and took her first steps at 36 y.o. Mother reported that patient's mobility is good and currently this is the most help she has needed for mobility. Mother reported that patient could not work with PT very much this  morning because her BP dropped. CSW explained SNF process with medicaid only. CSW made patient and mother aware that patient would have to 1) stay for 30 days at a SNF 2) sign over her SSI check to the SNF 3) go outside of [facility]. 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 Mother reported going outside of [facility] will not be a good option because all of patient's support lives in [place]. Patient reported that she strongly prefers to return to her group home. CSW made patient and mother aware that it will be up to the group home to determine if they can meet her needs there. Mother called patient's guardian/ grandmother Hannah Kennedy while CSW was in the room and made her aware of above. Guardian also prefers for patient to return to the group home and gave CSW Hannah Kennedy's number to call and discuss the case. CSW contacted [facility] group home owner and made him aware of above. Per Hannah Kennedy he will come see patient today and assess if they can meet her needs at the group home. CSW will continue to follow and assist as needed.   Employment status:  Disabled (Comment on whether or not currently receiving Disability) Insurance information:  Medicaid In [place] PT Recommendations:  Inpatient Rehab Consult Information / Referral to community resources:  [place], Other (Comment Required) (Patient and family prefer for patient to return to  ALF)  Patient/Family's Response to care:  Patient. Mother and grandmother/ guardian prefer for patient to return to ALF.   Patient/Family's Understanding of and Emotional Response to Diagnosis, Current Treatment, and Prognosis:  Patient and her family were very pleasant and thanked CSW for assistance.   Emotional Assessment Appearance:  Appears stated age Attitude/Demeanor/Rapport:    Affect (typically observed):  Accepting, Adaptable, Pleasant Orientation:  Oriented to Self, Oriented to Place, Oriented  to  Time, Oriented to Situation Alcohol / Substance use:  Not Applicable Psych involvement (Current and /or in the community):  No (Comment)  Discharge Needs  Concerns to be addressed:  Discharge Planning Concerns Readmission within the last 30 days:  No Current discharge risk:  Dependent with Mobility Barriers to Discharge:  Continued Medical Work up   [employer], Hannah Beets, LCSW 01/19/2017, 11:30 AM

## 2017-01-19 NOTE — Clinical Social Work Placement (Signed)
   CLINICAL SOCIAL WORK PLACEMENT  NOTE  Date:  01/19/2017  Patient Details  Name: Hannah Kennedy MRN: [id number] Date of Birth: August 12, 1986  Clinical Social Work is seeking post-discharge placement for this patient at the Skilled  Nursing Facility level of care (*CSW will initial, date and re-position this form in  chart as items are completed):  Yes   Patient/family provided with [place] Clinical Social Work Department's list of facilities offering this level of care within the geographic area requested by the patient (or if unable, by the patient's family).  Yes   Patient/family informed of their freedom to choose among providers that offer the needed level of care, that participate in Medicare, Medicaid or managed care program needed by the patient, have an available bed and are willing to accept the patient.  Yes   Patient/family informed of [facility]'s ownership interest in [facility] and [facility], as well as of the fact that they are under no obligation to receive care at these facilities.  PASRR submitted to EDS on       PASRR number received on       Existing PASRR number confirmed on 01/19/17 (ALF PASARR )     FL2 transmitted to all facilities in geographic area requested by pt/family on 01/19/17     FL2 transmitted to all facilities within larger geographic area on 01/19/17     Patient informed that his/her managed care company has contracts with or will negotiate with certain facilities, including the following:            Patient/family informed of bed offers received.  Patient chooses bed at       Physician recommends and patient chooses bed at      Patient to be transferred to   on  .  Patient to be transferred to facility by       Patient family notified on   of transfer.  Name of family member notified:        PHYSICIAN       Additional Comment:    _______________________________________________ Shadara Lopez, Darleen CrockerBailey M, LCSW 01/19/2017,  11:27 AM

## 2017-01-19 NOTE — Progress Notes (Signed)
[facility] Inpatient Rehab Admissions - I received a referral from case manager for acute inpatient rehab admission.  I spoke with my rehab MD about patient.  I would like to see what the group home owner thinks after seeing patient today.  Noted mom prefers to go directly back to the group home.  I will follow progress for now.  Call me for questions.  #[phone]

## 2017-01-19 NOTE — Evaluation (Signed)
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   Clinical Impression   Pt is 31 year old female s/p R THR(anterior) who was living at a group home and has 2 caregivers present in room with her. Pt was independent in all ADLs prior to surgery with supervision to get to shower and is eager to return to PLOF. She has limitations in RUE and hand due to contractures and increased tone from CP but is able to use a platform walker for mobility with 2 people assisting with mod to max assist and max cues.  Pt is currently limited in functional ADLs due to pain, decreased ROM, and nausea.  Pt requires maximum assist for LB dressing and bathing skills due to pain and decreased AROM of R LE  Using her L hand to open and close reacher and compensatory tech for holding sock aid and would benefit from continued skilled OT services for education in assistive devices, functional mobility, and education in recommendations for home modifications to increase safety and prevent falls.  Pt is a good candidate for CIR to continue rehabilitation and gain as much independence in ADLs as possible before returning to group home.      Follow Up Recommendations  CIR    Equipment Recommendations  Other (comment) (reacher and sock aid)    Recommendations for Other Services       Precautions / Restrictions Precautions Precautions: Fall;Anterior Hip Precaution Comments: R hip wound vac Restrictions Weight Bearing Restrictions: Yes RLE Weight Bearing: Weight bearing as tolerated      Mobility Bed Mobility                  Transfers                       Balance                                            ADL Overall ADL's : Needs assistance/impaired Eating/Feeding: Set up;Minimal assistance   Grooming: Wash/dry hands;Wash/dry face;Oral care;Applying deodorant;Brushing hair;Set up           Upper Body Dressing : Set up;Min guard   Lower Body Dressing: Moderate assistance;Set up;Cueing for sequencing;With adaptive equipment Lower Body Dressing Details (indicate cue type and reason): cues and set up needed for use of reacher and sock aid while seated for LLE only Toilet Transfer: +2 for safety/equipment;Set up;Stand-pivot;BSC;+2 for physical assistance;Moderate assistance Toilet Transfer Details (indicate cue type and reason): mod assist of 1 person and max of the other to assist with weight shifting and turns with max cues to stand upright for transfer to commode                 Vision         Perception     Praxis      Pertinent Vitals/Pain Pain Assessment: Faces Faces Pain Scale: Hurts even more Pain Location: R hip Pain Descriptors / Indicators: Aching;Grimacing;Guarding Pain Intervention(s): Limited activity within patient's tolerance;Monitored during session;RN gave pain meds during session     Hand Dominance Right  Extremity/Trunk Assessment Upper Extremity Assessment Upper Extremity Assessment: RUE deficits/detail RUE Deficits / Details: contractures with increased tone in RUE with R wrist held in flexion with minimal extension and minimal flex and extension of fingers; shoulder AROM to about 85 degrees   Lower Extremity Assessment Lower Extremity Assessment: Defer to PT evaluation       Communication Communication Communication: No difficulties   Cognition Arousal/Alertness: Awake/alert Behavior During Therapy: WFL for tasks assessed/performed Overall Cognitive Status: Within Functional Limits for tasks assessed                     General Comments       Exercises        Shoulder Instructions      Home Living Family/patient expects to be discharged to:: Group home                                 Additional Comments: Group home is single level with ramp entry      Prior Functioning/Environment Level of Independence: Needs assistance    ADL's / Homemaking Assistance Needed: Pt was independent in bathing and dressing but needed supervision to get to shower only.   Comments: Assist for ADLs (recently sponge bathing primarily); mod indep for household mobility (recent use of 4WRW due to R hip pain).  Does endorse 2-3 falls in previous six months related to R hip pain.        OT Problem List: Decreased strength;Decreased range of motion;Decreased activity tolerance;Pain;Decreased safety awareness;Impaired balance (sitting and/or standing);Decreased knowledge of precautions      OT Treatment/Interventions: Self-care/ADL training;Patient/family education;Therapeutic activities;Balance training;DME and/or AE instruction    OT Goals(Current goals can be found in the care plan section) Acute Rehab OT Goals Patient Stated Goal: "to get up and do more again" OT Goal Formulation: With patient/family Time For Goal Achievement: 02/02/17 Potential to Achieve Goals: Good ADL Goals Pt Will Perform Lower Body Dressing: with set-up;with min assist;with adaptive equipment;sit to/from stand (using platform walker and no LOB ) Pt Will Transfer to Toilet: with mod assist;with +2 assist;bedside commode (using platform walker)  OT Frequency: Min 1X/week   Barriers to D/C:            Co-evaluation              End of Session Equipment Utilized During Treatment: Gait belt Nurse Communication: Other (comment) (pt urinated on commode and IV beeping)  Activity Tolerance: Patient limited by pain Patient left: in chair;with call bell/phone within reach;with chair alarm set;with family/visitor present  OT Visit Diagnosis: Pain;Muscle weakness  (generalized) (M62.81);History of falling (Z91.81) Pain - Right/Left: Right Pain - part of body: Hip                ADL either performed or assessed with clinical judgement  Time: 1430-1510 OT Time Calculation (min): 40 min Charges:  OT General Charges $OT Visit: 1 Procedure OT Evaluation $OT Eval Moderate Complexity: 1 Procedure OT Treatments $Self Care/Home Management : 23-37 mins G-Codes:     Susanne BordersSusan Wofford, OTR/L ascom [phone] 01/19/17, 3:29 PM

## 2017-01-19 NOTE — Progress Notes (Signed)
Pt. Foley removed at 0630

## 2017-01-20 ENCOUNTER — Ambulatory Visit: Admit: 2017-01-20 | Payer: Medicaid Other | Admitting: Vascular Surgery

## 2017-01-20 LAB — BASIC METABOLIC PANEL
Anion gap: 7 (ref 5–15)
BUN: 14 mg/dL (ref 6–20)
CHLORIDE: 102 mmol/L (ref 101–111)
CO2: 27 mmol/L (ref 22–32)
CREATININE: 0.72 mg/dL (ref 0.44–1.00)
Calcium: 9 mg/dL (ref 8.9–10.3)
GFR calc Af Amer: >60 mL/min (ref >60)
GFR calc non Af Amer: >60 mL/min (ref >60)
GLUCOSE: 143 mg/dL — AB (ref 65–99)
POTASSIUM: 4.2 mmol/L (ref 3.5–5.1)
Sodium: 136 mmol/L (ref 135–145)

## 2017-01-20 LAB — CBC
HCT: 33.2 % — ABNORMAL LOW (ref 35.0–47.0)
HEMOGLOBIN: 11.4 g/dL — AB (ref 12.0–16.0)
MCH: 29.1 pg (ref 26.0–34.0)
MCHC: 34.4 g/dL (ref 32.0–36.0)
MCV: 84.6 fL (ref 80.0–100.0)
Platelets: 226 10*3/uL (ref 150–440)
RBC: 3.92 MIL/uL (ref 3.80–5.20)
RDW: 13.9 % (ref 11.5–14.5)
WBC: 18.2 10*3/uL — ABNORMAL HIGH (ref 3.6–11.0)

## 2017-01-20 SURGERY — IVC FILTER INSERTION
Anesthesia: Moderate Sedation

## 2017-01-20 NOTE — Plan of Care (Signed)
Problem: Bowel/Gastric: Goal: Will not experience complications related to bowel motility Outcome: Progressing Pt is progressing toward goals.   

## 2017-01-20 NOTE — Progress Notes (Addendum)
Clinical Child psychotherapistocial Worker (CSW) contacted [facility] Kennedy group home owner to see if he can accept patient back. Per Hannah Kennedy patient was very limited with PT yesterday due to BP so he is coming today between 1-2 to see if the group home can meet her needs. Per Hannah Kennedy patient at baseline was only doing 10% mobility and was mostly in the wheel chair. Per Hannah Kennedy the staff were doing wheel chair transfers for patient. Per [place] staff were bathing and assisting in all ADL's. PT is aware of above and will plan to have afternoon PT session while Hannah BameRonnie is at bedside. CSW will continue to follow and assist as needed.   Baker Hughes IncorporatedBailey Yajahira Tison, LCSW [phone]

## 2017-01-20 NOTE — Progress Notes (Signed)
Clinical Education officer, museum (CSW) met with patient's group home owner [name], mother and grandmother/ guardian this afternoon after PT session. Per [name] he can accept patient back at the group home. [name] requested that CIR still be pursued. Per [name] the family can chose any home health agency they want. PT is recommending a wheel chair with a removable side handle and bedside commode with a removable side handle. CSW made RN case manager aware of above. CSW will continue to follow and assist as needed.   [employer], LCSW [phone]

## 2017-01-20 NOTE — Care Management (Signed)
Message sent to Genie with CIR to check status. Wheelchair and bedside commode with drop arm requested from [place] [facility]. Home Health referral to [facility] for HHPT if patient returns to group home. RNCM will continue to follow.

## 2017-01-20 NOTE — Progress Notes (Signed)
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    PT Comments    Improved performance with bilat vs unilateral PFRW for transfer and gait effort; requires step by step verbal instruction from therapist to sequence stepping pattern and mobility efforts.  Poor ability to activate bilat (R > L) closed-chain TKE in stance; tends to fully unweight R LE at times (? Flexor tone vs pain response?) during stepping activities. Anticipate and recommend need for WC level as primary mobility to maximize patient safety, indep and to decrease caregiver burden upon discharge.  If returns to group home, will need manual WC with removable armrests, DABSC for daily mobility.   Follow Up Recommendations  CIR     Equipment Recommendations       Recommendations for Other Services       Precautions / Restrictions Precautions Precautions: Fall;Anterior Hip Precaution Comments: R hip wound vac Restrictions Weight Bearing Restrictions: Yes RLE Weight Bearing: Weight bearing as tolerated    Mobility  Bed Mobility Overal bed mobility: Needs Assistance Bed Mobility: Supine to Sit     Supine to sit: Mod assist     General bed mobility comments: assist for LE management and truncal elevation with sup/sit transition towards R  Transfers Overall transfer level: Needs assistance Equipment used: Bilateral platform walker Transfers: Sit to/from Stand Sit to Stand: Min assist;Mod assist;+2 safety/equipment         General transfer comment: bilat UEs on RW to assist with lift off; limited active use of R LE, even tends to lift off floor at times (increased flexor  tone?)  Ambulation/Gait Ambulation/Gait assistance: Mod assist;+2 physical assistance Ambulation Distance (Feet): 6 Feet Assistive device: Bilateral platform walker       General Gait Details: step to gait pattern with decreased foot clearance, step height/length bilat; maintains flexed posture throughout trunk, bilat hips and knees, limited ability to initiate closed-chain TKE (R > L).  Generally weak and unsteady; unlikely to be ambulatory as primary mobility upon discharge   Stairs            Wheelchair Mobility    Modified Rankin (Stroke Patients Only)       Balance Overall balance assessment: Needs assistance Sitting-balance support: No upper extremity supported;Feet supported Sitting balance-Leahy Scale: Good     Standing balance support: Bilateral upper extremity supported Standing balance-Leahy Scale: Fair                      Cognition Arousal/Alertness: Awake/alert Behavior During Therapy: WFL for tasks assessed/performed Overall Cognitive Status: Within Functional Limits for tasks assessed                      Exercises Other Exercises Other Exercises: Toilet transfer, SPT with R PFRW, max assist +2--difficulty coordinating LE steppign pattern, requiring max/step by step cuing from therapist.  Impulsive, tends to lift both feet off floor at times. Other Exercises: Sit/stand x2 with bilat PFRW, min/mod assist +2-improved safety, control with use of bilat vs unilateral PFRW    General Comments        Pertinent Vitals/Pain Pain Assessment: Faces Faces Pain Scale: Hurts little more Pain Location: R hip Pain Descriptors / Indicators: Aching;Grimacing;Guarding  Pain Intervention(s): Limited activity within patient's tolerance;Monitored during session;Repositioned;Premedicated before session    Home Living                      Prior Function            PT Goals (current goals can now be found in the care plan section) Acute  Rehab PT Goals Patient Stated Goal: "to get up and do more again" PT Goal Formulation: With patient/family Time For Goal Achievement: 02/02/17 Potential to Achieve Goals: Good Progress towards PT goals: Progressing toward goals    Frequency    BID      PT Plan Current plan remains appropriate    Co-evaluation             End of Session Equipment Utilized During Treatment: Gait belt Activity Tolerance: Patient tolerated treatment well Patient left: in chair;with call bell/phone within reach;with chair alarm set;with family/visitor present Nurse Communication: Mobility status PT Visit Diagnosis: Pain;Difficulty in walking, not elsewhere classified (R26.2);Muscle weakness (generalized) (M62.81) Pain - Right/Left: Right Pain - part of body: Hip     Time: 0931-1002 PT Time Calculation (min) (ACUTE ONLY): 31 min  Charges:  $Gait Training: 8-22 mins $Therapeutic Activity: 8-22 mins                    G Codes:       Algernon Mundie H. Manson PasseyBrown, PT, DPT, NCS 01/20/17, 10:21 AM [phone]

## 2017-01-20 NOTE — Progress Notes (Signed)
Pt has drank her mag citrate, is in bed, alert and oriented, aware of plan of care for possible d/c tomorrow. Mother at bedside is pleased with pt's progress.

## 2017-01-20 NOTE — Progress Notes (Signed)
Shift assessment completed at 0800. Pt voided by bedpan at that time. Mother at bedside, mother stating that pt will not be shirking her physical therapy today like she did yesterday.pt agreed. Pt is on room air,lungs clear bilat, Hr is regular, abdomen is soft, bs heard.ppp. Foot pumps on. Since assessment, pt has worked with physical therapy x2, cooperative, received pain meds at 1100 and at 1400. Pt was given mag citrate to encourage Bm. Multiple family members present with pt.

## 2017-01-20 NOTE — Progress Notes (Signed)
   Subjective: 2 Days Post-Op Procedure(s) (LRB): TOTAL HIP ARTHROPLASTY ANTERIOR APPROACH (Right) Patient reports pain as mild.  Pain improving. Patient is well, and has had no acute complaints or problems Denies any CP, SOB, ABD pain. We will continue therapy today.    Objective: Vital signs in last 24 hours: Temp:  [97.6 F (36.4 C)-99 F (37.2 C)] 97.6 F (36.4 C) (03/01 0757) Pulse Rate:  [68-96] 68 (03/01 0757) Resp:  [16-20] 16 (03/01 0757) BP: (80-105)/(47-63) 105/63 (03/01 0757) SpO2:  [92 %-96 %] 96 % (03/01 0757)  Intake/Output from previous day: 02/28 0701 - 03/01 0700 In: 1372.5 [P.O.:480; I.V.:892.5] Out: 1185 [Urine:1075; Drains:110] Intake/Output this shift: No intake/output data recorded.   Recent Labs  01/18/17 1625 01/19/17 0419 01/20/17 0414  HGB 13.2 12.6 11.4*    Recent Labs  01/19/17 0419 01/20/17 0414  WBC 15.2* 18.2*  RBC 4.34 3.92  HCT 36.9 33.2*  PLT 237 226    Recent Labs  01/19/17 0419 01/20/17 0414  NA 137 136  K 4.4 4.2  CL 103 102  CO2 25 27  BUN 7 14  CREATININE 0.62 0.72  GLUCOSE 146* 143*  CALCIUM 9.4 9.0   No results for input(s): LABPT, INR in the last 72 hours.  EXAM General - Patient is Alert, Appropriate and Oriented Extremity - Neurovascular intact Sensation intact distally Intact pulses distally Dorsiflexion/Plantar flexion intact No cellulitis present Compartment soft Dressing - dressing C/D/I and no drainage, wound vac intact, hemovac removed Motor Function - intact, minimal right ankle ROM at baseline  Past Medical History:  Diagnosis Date  . Arthritis   . Cerebral palsy (HCC)   . Depression   . GERD (gastroesophageal reflux disease)   . Glaucoma   . Hemiparesis (HCC)    right side  . 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 Pulmonary emboli (HCC)   . Schizophrenia (HCC)     Assessment/Plan:   2 Days Post-Op Procedure(s) (LRB): TOTAL HIP ARTHROPLASTY ANTERIOR APPROACH (Right) Active  Problems:   Drug-induced osteonecrosis of femur, right (HCC)  Estimated body mass index is 24.69 kg/m as calculated from the following:   Height as of 01/14/17: 5\' 2"  (1.575 m).   Weight as of 01/14/17: 61.2 kg (135 lb). Advance diet Up with therapy  Needs BM CM to assist with discharge. Will plan on discharge tomorrow  DVT Prophylaxis - Lovenox, Foot Pumps and TED hose Weight-Bearing as tolerated to right leg   T. Cranston Neighborhris Gaines, PA-C [facility] 01/20/2017, 8:32 AM

## 2017-01-20 NOTE — Progress Notes (Signed)
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    PT Comments    Extensive work this session on squat pivot transfers between seating surfaces, as this transfer method would be recommended as primary transfer technique immediately upon discharge.  Able to perform with mod assist +1 (second person stand-by for safety).  Predominant use of L hemi-body with all functional transfers; difficulty with forward weight shift and active use of R LE (due to baseline hemiparesis and acute pain). Group home owner, Christen Bame, present towards end of session to observe patient's performance.  Reviewed with him recommendations for WC level as primary mobility, scoot/squat pivot transfers for access to all seating surfaces should patient have to return to group home.  [name] voiced ability to provide this level of assist if needed upon discharge, but preferred to continue efforts for discharge to CIR for continued intensive therapy (to optimize patient performance, decrease caregiver burden) upon discharge.  Discussed above with social work. Continue to strongly recommend CIR upon discharge, as patient could definitely benefit from high-level, post-acute PT services with emphasis on R LE strength/ROM, transfers and functional mobility as appropriate.  Appears to have good family support and good support system in place to maintain mobility gains upon return to group home.    Follow Up Recommendations  CIR     Equipment Recommendations       Recommendations for Other Services       Precautions / Restrictions Precautions Precautions: Fall;Anterior Hip Precaution Comments: R  hip wound vac Restrictions Weight Bearing Restrictions: Yes RLE Weight Bearing: Weight bearing as tolerated    Mobility  Bed Mobility Overal bed mobility: Needs Assistance Bed Mobility: Supine to Sit     Supine to sit: Mod assist     General bed mobility comments: educated in LE hook technique to assist with R LE elevation over edge of bed  Transfers Overall transfer level: Needs assistance Equipment used: Bilateral platform walker Transfers: Sit to/from Stand Sit to Stand: Mod assist;+2 safety/equipment         General transfer comment: bilat UEs on RW to assist with lift off; very slow and deliberate  Ambulation/Gait Ambulation/Gait assistance: Mod assist;+2 physical assistance Ambulation Distance (Feet): 10 Feet Assistive device: Bilateral platform walker       General Gait Details: step to gait pattern; improved ability to sequence gait pattern, but continued difficulty with closed-chain TKE.  Progressive trunk, hip, knee flexion as fatigue increases.   Stairs            Wheelchair Mobility    Modified Rankin (Stroke Patients Only)       Balance Overall balance assessment: Needs assistance Sitting-balance support: No upper extremity supported;Feet supported Sitting balance-Leahy Scale: Good     Standing balance support: Bilateral upper extremity supported Standing balance-Leahy Scale: Fair                      Cognition Arousal/Alertness: Awake/alert Behavior During Therapy: WFL for tasks assessed/performed Overall Cognitive Status: Within Functional Limits for tasks assessed                      Exercises Other Exercises Other Exercises: Extensive work on squat pivot transfers  between seating surfaces, mod assist +1--facilitation for forward weight shift, lift off and lateral movement. Predominat use of L hemi-body to assist with transfers; very slow and deliberate, but improved ability when give time/space to complete in  method comfortable to patient.  Completed between bed/recliner, bed/manual WC, level surfaces; similar assist across all seating surfaces.    General Comments        Pertinent Vitals/Pain Pain Assessment: Faces Faces Pain Scale: Hurts even more Pain Location: R hip Pain Descriptors / Indicators: Aching;Grimacing;Guarding Pain Intervention(s): Limited activity within patient's tolerance;Monitored during session;Repositioned;Patient requesting pain meds-RN notified    Home Living                      Prior Function            PT Goals (current goals can now be found in the care plan section) Acute Rehab PT Goals Patient Stated Goal: "to get up and do more again" PT Goal Formulation: With patient/family Time For Goal Achievement: 02/02/17 Potential to Achieve Goals: Good Progress towards PT goals: Progressing toward goals    Frequency    BID      PT Plan Current plan remains appropriate    Co-evaluation             End of Session Equipment Utilized During Treatment: Gait belt Activity Tolerance: Patient tolerated treatment well Patient left: in bed;with call bell/phone within reach;with bed alarm set;with family/visitor present Nurse Communication: Mobility status PT Visit Diagnosis: Pain;Difficulty in walking, not elsewhere classified (R26.2);Muscle weakness (generalized) (M62.81) Pain - Right/Left: Right Pain - part of body: Hip     Time: [id number] PT Time Calculation (min) (ACUTE ONLY): 50 min  Charges:  $Gait Training: 8-22 mins $Therapeutic Activity: 23-37 mins                    G Codes:       Keishaun Hazel H. Manson PasseyBrown, PT, DPT, NCS 01/20/17, 4:10 PM [phone]

## 2017-01-21 ENCOUNTER — Inpatient Hospital Stay (HOSPITAL_COMMUNITY)
Admission: RE | Admit: 2017-01-21 | Discharge: 2017-02-08 | DRG: 560 | Disposition: A | Discharge status: Skilled Nursing Facility | Payer: Medicaid Other | Source: Other Acute Inpatient Hospital | Attending: Physical Medicine & Rehabilitation | Admitting: Physical Medicine & Rehabilitation

## 2017-01-21 ENCOUNTER — Encounter: Payer: Self-pay | Admitting: Physical Medicine and Rehabilitation

## 2017-01-21 DIAGNOSIS — Z86711 Personal history of pulmonary embolism: Secondary | ICD-10-CM | POA: Diagnosis not present

## 2017-01-21 DIAGNOSIS — G8918 Other acute postprocedural pain: Secondary | ICD-10-CM | POA: Diagnosis not present

## 2017-01-21 DIAGNOSIS — R8290 Unspecified abnormal findings in urine: Secondary | ICD-10-CM | POA: Diagnosis not present

## 2017-01-21 DIAGNOSIS — F209 Schizophrenia, unspecified: Secondary | ICD-10-CM | POA: Diagnosis present

## 2017-01-21 DIAGNOSIS — R269 Unspecified abnormalities of gait and mobility: Secondary | ICD-10-CM

## 2017-01-21 DIAGNOSIS — D62 Acute posthemorrhagic anemia: Secondary | ICD-10-CM

## 2017-01-21 DIAGNOSIS — G2119 Other drug induced secondary parkinsonism: Secondary | ICD-10-CM | POA: Diagnosis present

## 2017-01-21 DIAGNOSIS — Z471 Aftercare following joint replacement surgery: Secondary | ICD-10-CM | POA: Diagnosis present

## 2017-01-21 DIAGNOSIS — M87059 Idiopathic aseptic necrosis of unspecified femur: Secondary | ICD-10-CM | POA: Diagnosis not present

## 2017-01-21 DIAGNOSIS — G802 Spastic hemiplegic cerebral palsy: Secondary | ICD-10-CM | POA: Diagnosis not present

## 2017-01-21 DIAGNOSIS — Z86718 Personal history of other venous thrombosis and embolism: Secondary | ICD-10-CM | POA: Diagnosis not present

## 2017-01-21 DIAGNOSIS — H409 Unspecified glaucoma: Secondary | ICD-10-CM | POA: Diagnosis present

## 2017-01-21 DIAGNOSIS — R079 Chest pain, unspecified: Secondary | ICD-10-CM | POA: Diagnosis not present

## 2017-01-21 DIAGNOSIS — H209 Unspecified iridocyclitis: Secondary | ICD-10-CM

## 2017-01-21 DIAGNOSIS — K219 Gastro-esophageal reflux disease without esophagitis: Secondary | ICD-10-CM | POA: Diagnosis present

## 2017-01-21 DIAGNOSIS — G809 Cerebral palsy, unspecified: Secondary | ICD-10-CM | POA: Diagnosis present

## 2017-01-21 DIAGNOSIS — R609 Edema, unspecified: Secondary | ICD-10-CM | POA: Diagnosis not present

## 2017-01-21 DIAGNOSIS — D72829 Elevated white blood cell count, unspecified: Secondary | ICD-10-CM | POA: Diagnosis present

## 2017-01-21 DIAGNOSIS — G8110 Spastic hemiplegia affecting unspecified side: Secondary | ICD-10-CM

## 2017-01-21 DIAGNOSIS — M87051 Idiopathic aseptic necrosis of right femur: Secondary | ICD-10-CM

## 2017-01-21 DIAGNOSIS — Z882 Allergy status to sulfonamides status: Secondary | ICD-10-CM

## 2017-01-21 DIAGNOSIS — Z96641 Presence of right artificial hip joint: Secondary | ICD-10-CM | POA: Diagnosis not present

## 2017-01-21 DIAGNOSIS — N3941 Urge incontinence: Secondary | ICD-10-CM | POA: Diagnosis present

## 2017-01-21 DIAGNOSIS — E8809 Other disorders of plasma-protein metabolism, not elsewhere classified: Secondary | ICD-10-CM | POA: Diagnosis present

## 2017-01-21 DIAGNOSIS — R0782 Intercostal pain: Secondary | ICD-10-CM | POA: Diagnosis not present

## 2017-01-21 LAB — URINALYSIS, ROUTINE W REFLEX MICROSCOPIC
Bilirubin Urine: NEGATIVE
Glucose, UA: NEGATIVE mg/dL
Hgb urine dipstick: NEGATIVE
Ketones, ur: NEGATIVE mg/dL
Nitrite: NEGATIVE
Protein, ur: NEGATIVE mg/dL
Specific Gravity, Urine: 1.001 — ABNORMAL LOW (ref 1.005–1.030)
pH: 7 (ref 5.0–8.0)

## 2017-01-21 LAB — SURGICAL PATHOLOGY

## 2017-01-21 MED ORDER — TRAZODONE HCL 50 MG PO TABS
25.0000 mg | ORAL_TABLET | Freq: Every evening | ORAL | Status: DC | PRN
  Administered 2017-01-23 – 2017-02-06 (×9): 50 mg via ORAL
  Filled 2017-01-21 (×11): qty 1

## 2017-01-21 MED ORDER — FLUTICASONE PROPIONATE 50 MCG/ACT NA SUSP
1.0000 | Freq: Every day | NASAL | Status: DC
  Administered 2017-01-22 – 2017-02-08 (×17): 1 via NASAL
  Filled 2017-01-21 (×2): qty 16

## 2017-01-21 MED ORDER — OXYCODONE HCL 5 MG PO TABS
5.0000 mg | ORAL_TABLET | ORAL | Status: DC | PRN
  Administered 2017-01-25: 5 mg via ORAL
  Administered 2017-01-26 – 2017-02-01 (×15): 10 mg via ORAL
  Administered 2017-02-01: 5 mg via ORAL
  Administered 2017-02-03 – 2017-02-08 (×2): 10 mg via ORAL
  Filled 2017-01-21: qty 1
  Filled 2017-01-21 (×4): qty 2
  Filled 2017-01-21: qty 1
  Filled 2017-01-21 (×12): qty 2
  Filled 2017-01-21: qty 1
  Filled 2017-01-21 (×3): qty 2

## 2017-01-21 MED ORDER — PHENOL 1.4 % MT LIQD: 1.0000 | OROMUCOSAL | Status: DC | PRN

## 2017-01-21 MED ORDER — ACETAMINOPHEN 325 MG PO TABS
325.0000 mg | ORAL_TABLET | ORAL | Status: DC | PRN
  Administered 2017-01-21 – 2017-02-02 (×12): 650 mg via ORAL
  Filled 2017-01-21 (×13): qty 2

## 2017-01-21 MED ORDER — TRAMADOL HCL 50 MG PO TABS
50.0000 mg | ORAL_TABLET | Freq: Three times a day (TID) | ORAL | Status: DC
  Administered 2017-01-21 – 2017-01-26 (×20): 50 mg via ORAL
  Filled 2017-01-21 (×20): qty 1

## 2017-01-21 MED ORDER — ALUM & MAG HYDROXIDE-SIMETH 200-200-20 MG/5ML PO SUSP
30.0000 mL | ORAL | Status: DC | PRN
  Administered 2017-01-24 – 2017-01-29 (×2): 30 mL via ORAL
  Filled 2017-01-21 (×2): qty 30

## 2017-01-21 MED ORDER — GUAIFENESIN-DM 100-10 MG/5ML PO SYRP: 5.0000 mL | ORAL_SOLUTION | Freq: Four times a day (QID) | ORAL | Status: DC | PRN

## 2017-01-21 MED ORDER — DIPHENHYDRAMINE HCL 12.5 MG/5ML PO ELIX: 12.5000 mg | ORAL_SOLUTION | Freq: Four times a day (QID) | ORAL | Status: DC | PRN

## 2017-01-21 MED ORDER — DOCUSATE SODIUM 100 MG PO CAPS: 100.0000 mg | ORAL_CAPSULE | Freq: Two times a day (BID) | ORAL | Status: DC

## 2017-01-21 MED ORDER — MENTHOL 3 MG MT LOZG
1.0000 | LOZENGE | OROMUCOSAL | Status: DC | PRN
  Filled 2017-01-21: qty 9

## 2017-01-21 MED ORDER — METHOCARBAMOL 500 MG PO TABS: 500.0000 mg | ORAL_TABLET | Freq: Four times a day (QID) | ORAL | Status: DC

## 2017-01-21 MED ORDER — PREDNISONE 5 MG PO TABS
10.0000 mg | ORAL_TABLET | Freq: Every day | ORAL | Status: DC
  Administered 2017-01-22 – 2017-02-08 (×18): 10 mg via ORAL
  Filled 2017-01-21 (×18): qty 2

## 2017-01-21 MED ORDER — BACLOFEN 5 MG HALF TABLET
5.0000 mg | ORAL_TABLET | Freq: Every day | ORAL | Status: DC
  Administered 2017-01-21 – 2017-01-24 (×4): 5 mg via ORAL
  Filled 2017-01-21 (×4): qty 1

## 2017-01-21 MED ORDER — PROCHLORPERAZINE 25 MG RE SUPP: 12.5000 mg | Freq: Four times a day (QID) | RECTAL | Status: DC | PRN

## 2017-01-21 MED ORDER — PROCHLORPERAZINE MALEATE 5 MG PO TABS: 5.0000 mg | ORAL_TABLET | Freq: Four times a day (QID) | ORAL | Status: DC | PRN

## 2017-01-21 MED ORDER — BISACODYL 10 MG RE SUPP: 10.0000 mg | Freq: Every day | RECTAL | Status: DC | PRN

## 2017-01-21 MED ORDER — PANTOPRAZOLE SODIUM 40 MG PO TBEC
40.0000 mg | DELAYED_RELEASE_TABLET | Freq: Every day | ORAL | Status: DC
  Administered 2017-01-22 – 2017-02-08 (×18): 40 mg via ORAL
  Filled 2017-01-21 (×18): qty 1

## 2017-01-21 MED ORDER — METHOCARBAMOL 500 MG PO TABS
500.0000 mg | ORAL_TABLET | Freq: Four times a day (QID) | ORAL | Status: DC | PRN
  Administered 2017-01-24 – 2017-02-01 (×12): 500 mg via ORAL
  Filled 2017-01-21 (×13): qty 1

## 2017-01-21 MED ORDER — PROCHLORPERAZINE EDISYLATE 5 MG/ML IJ SOLN: 5.0000 mg | Freq: Four times a day (QID) | INTRAMUSCULAR | Status: DC | PRN

## 2017-01-21 MED ORDER — LATANOPROST 0.005 % OP SOLN
1.0000 [drp] | Freq: Every day | OPHTHALMIC | Status: DC
  Administered 2017-01-21 – 2017-02-03 (×10): 1 [drp] via OPHTHALMIC
  Filled 2017-01-21: qty 2.5

## 2017-01-21 MED ORDER — POLYETHYLENE GLYCOL 3350 17 G PO PACK
17.0000 g | PACK | Freq: Every day | ORAL | Status: DC | PRN
  Administered 2017-01-30: 17 g via ORAL
  Filled 2017-01-21: qty 1

## 2017-01-21 MED ORDER — RIVAROXABAN 10 MG PO TABS: 10.0000 mg | ORAL_TABLET | Freq: Every day | ORAL | 0 refills | Status: DC

## 2017-01-21 MED ORDER — CHLORPROMAZINE HCL 100 MG PO TABS
100.0000 mg | ORAL_TABLET | ORAL | Status: DC
  Administered 2017-01-22 – 2017-02-08 (×18): 100 mg via ORAL
  Filled 2017-01-21 (×18): qty 1

## 2017-01-21 MED ORDER — SERTRALINE HCL 50 MG PO TABS
50.0000 mg | ORAL_TABLET | Freq: Every day | ORAL | Status: DC
  Administered 2017-01-21 – 2017-02-07 (×17): 50 mg via ORAL
  Filled 2017-01-21 (×17): qty 1

## 2017-01-21 MED ORDER — BRIMONIDINE TARTRATE 0.2 % OP SOLN
1.0000 [drp] | Freq: Three times a day (TID) | OPHTHALMIC | Status: DC
  Administered 2017-01-21 – 2017-02-08 (×45): 1 [drp] via OPHTHALMIC
  Filled 2017-01-21 (×2): qty 5

## 2017-01-21 MED ORDER — VITAMIN B-12 1000 MCG PO TABS
1000.0000 ug | ORAL_TABLET | Freq: Every day | ORAL | Status: DC
  Administered 2017-01-22 – 2017-02-08 (×18): 1000 ug via ORAL
  Filled 2017-01-21 (×18): qty 1

## 2017-01-21 MED ORDER — OXYCODONE HCL 5 MG PO TABS: 5.0000 mg | ORAL_TABLET | ORAL | 0 refills | Status: DC | PRN

## 2017-01-21 MED ORDER — TIMOLOL MALEATE 0.25 % OP SOLN
1.0000 [drp] | Freq: Every day | OPHTHALMIC | Status: DC
  Administered 2017-01-22 – 2017-02-08 (×17): 1 [drp] via OPHTHALMIC
  Filled 2017-01-21 (×2): qty 5

## 2017-01-21 MED ORDER — SENNOSIDES-DOCUSATE SODIUM 8.6-50 MG PO TABS
2.0000 | ORAL_TABLET | Freq: Every day | ORAL | Status: DC
  Administered 2017-01-21 – 2017-02-07 (×16): 2 via ORAL
  Filled 2017-01-21 (×16): qty 2

## 2017-01-21 MED ORDER — ENOXAPARIN SODIUM 40 MG/0.4ML ~~LOC~~ SOLN
40.0000 mg | SUBCUTANEOUS | Status: DC
  Administered 2017-01-22 – 2017-02-07 (×17): 40 mg via SUBCUTANEOUS
  Filled 2017-01-21 (×17): qty 0.4

## 2017-01-21 MED ORDER — FLEET ENEMA 7-19 GM/118ML RE ENEM: 1.0000 | ENEMA | Freq: Once | RECTAL | Status: DC | PRN

## 2017-01-21 NOTE — IPOC Note (Addendum)
Overall Plan of Care [facility](IPOC) Patient Details Name: Hannah DowseLatoya D Brashears MRN: [id number] DOB: 11/11/1986  Admitting Diagnosis: R ThR CP, Parkinsonism  Hospital Problems: Active Problems:   Avascular necrosis of bone of hip, right (HCC)   Abnormality of gait   History of DVT (deep vein thrombosis)   Post-operative pain   Spastic hemiplegia affecting nondominant side (HCC)   Leukocytosis   Acute blood loss anemia   Glaucoma   Uveitis   Status post total replacement of right hip     Functional Problem List: Nursing Pain, Endurance, Medication Management, Skin Integrity, Safety  PT Balance, Behavior, Endurance, Motor, Pain, Safety, Skin Integrity  OT Balance, Safety, Cognition, Endurance, Motor, Pain, Vision  SLP    TR         Basic ADL's: OT Grooming, Bathing, Dressing, Toileting     Advanced  ADL's: OT       Transfers: PT Bed Mobility, Bed to Chair, Car, Furniture, Civil Service fast [place]  OT Toilet, Research scientist (life sciences)Tub/Shower     Locomotion: PT Ambulation, Psychologist, prison and probation servicesWheelchair Mobility     Additional Impairments: OT Fuctional Use of Upper Extremity  SLP        TR      Anticipated Outcomes Item Anticipated Outcome  Self Feeding N/A  Swallowing      Basic self-care  Supervision-Min A  Toileting  Min A    Bathroom Transfers Supervision  Bowel/Bladder  n/a  Transfers  Supervsion assist with LRAD   Locomotion  Supervision assist in [facility] and supervision ambulation for short distances with LRAD  Communication     Cognition     Pain  Pain at or below level 5 with prn medications  Safety/Judgment  maintain safety with cues/reminders/supervision   Therapy Plan: PT Intensity: Minimum of 1-2 x/day ,45 to 90 minutes PT Frequency: 5 out of 7 days PT Duration Estimated Length of Stay: 19-24 days  OT Intensity: Minimum of 1-2 x/day, 45 to 90 minutes OT Frequency: 5 out of 7 days OT Duration/Estimated Length of Stay: 3.5 weeks         Team Interventions: Nursing Interventions Patient/Family Education,  Disease Management/Prevention, Discharge Planning, Skin Care/Wound Management, Pain Management, Psychosocial Support, Medication Management  PT interventions Ambulation/gait training, Warden/rangerBalance/vestibular training, Community reintegration, Discharge planning, Disease management/prevention, DME/adaptive equipment instruction, Functional mobility training, Neuromuscular re-education, Pain management, Patient/family education, Psychosocial support, Skin care/wound management, Splinting/orthotics, Stair training, Therapeutic Activities, UE/LE Strength taining/ROM, UE/LE Coordination activities, Visual/perceptual remediation/compensation, Therapeutic Exercise, Wheelchair propulsion/positioning  OT Interventions Discharge planning, Pain management, Self Care/advanced ADL retraining, Therapeutic Activities, UE/LE Coordination activities, Patient/family education, Functional mobility training, Therapeutic Exercise, DME/adaptive equipment instruction, Neuromuscular re-education, Psychosocial support, UE/LE Strength taining/ROM  SLP Interventions    TR Interventions    SW/CM Interventions  Pt/Family Education, Psychosocial Assessment & Discharge Planning    Team Discharge Planning: Destination: PT-Home ,OT- Home (group home) , SLP-  Projected Follow-up: PT-Home health PT, OT-  Home health OT, SLP-  Projected Equipment Needs: PT-Wheelchair (measurements), Wheelchair cushion (measurements), Rolling walker with 5" wheels, To be determined, OT- 3 in 1 bedside comode, To be determined, SLP-  Equipment Details: PT- , OT-  Patient/family involved in discharge planning: PT- Patient,  OT-Patient, SLP-   MD ELOS: 20-25 days. 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 Patient/family elected on surgical intervention and temporary IVC filter placed prior to  admission. She was  admitted to [facility] on 01/18/17 for anterior R-THR by Dr. Rosita Kea. Post op limited by increase in tone as well as pain. Noted to have hypotension post procedure as well as leucocytosis. Pt with resulting functional deficits with mobility and self-care. Will set goals for Supervision/Min A with PT/OT.   See Team Conference Notes for weekly updates to the plan of care

## 2017-01-21 NOTE — Progress Notes (Signed)
Physical Therapy Treatment Patient Details Name: Hannah Kennedy MRN: [id number] DOB: January 25, 1986 Today's Date: 01/21/2017    History of Present Illness admitted for acute hospitalization status post R THR (01/18/17) secondary to AVN, WBAT, anterior THPs.  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    PT Comments    Increased lethargy evident this date with increased time required for processing, task initiation.  Per RN, no changes in medication; has only received tylenol overnight for pain.  Vitals stable and WFL (low 100s/60s) in both supine and sitting positions without reports of orthostasis. Requiring increased level of assist for all functional activities; unsafe to attempt gait efforts this AM. R LE remains globally weak with noted difficulty initiation open-chain movement against gravity; mild/mod tone in R hip adduct and ankle PF noted.    Follow Up Recommendations  CIR     Equipment Recommendations       Recommendations for Other Services       Precautions / Restrictions Precautions Precautions: Fall;Anterior Hip Precaution Comments: R hip wound vac Restrictions Weight Bearing Restrictions: Yes RLE Weight Bearing: Weight bearing as tolerated    Mobility  Bed Mobility Overal bed mobility: Needs Assistance Bed Mobility: Supine to Sit     Supine to sit: Mod assist;Max assist     General bed mobility comments: increased lethargy evident this date; decreased response time, task initiation noted  Transfers Overall transfer level: Needs assistance   Transfers: Lateral/Scoot Transfers Sit to Stand: Max assist;Mod assist;+2 physical assistance         General transfer comment: scoot pivot over level surfaces; extensive assist for lift off and lateral movement, delayed task initiation and movement planning this date  Ambulation/Gait             General Gait Details:  unsafe/unable to tolerate this AM due to lethargy   Stairs            Wheelchair Mobility    Modified Rankin (Stroke Patients Only)       Balance Overall balance assessment: Needs assistance Sitting-balance support: No upper extremity supported;Feet supported Sitting balance-Leahy Scale: Good                              Cognition Arousal/Alertness: Awake/alert Behavior During Therapy: WFL for tasks assessed/performed Overall Cognitive Status: Within Functional Limits for tasks assessed                      Exercises Other Exercises Other Exercises: Supine R LE therex, act assist vs passive, 1x15: ankle pumps with heel cord stretching (+ multi-beat, sustained clonus noted), SAQs, heel slides and hip abduct/adduct.  1+/4 adduct tone noted; 2-/5 strength throughout R hip/knee (unable to complete full open-chain SAQ), 0/5 R ankle    General Comments        Pertinent Vitals/Pain Pain Assessment: Faces Faces Pain Scale: Hurts little more Pain Location: R hip Pain Descriptors / Indicators: Aching;Grimacing;Guarding Pain Intervention(s): Limited activity within patient's tolerance;Monitored during session;Repositioned    Home Living                      Prior Function            PT Goals (current goals can now be found in the care plan section) Acute Rehab PT Goals Patient Stated Goal: "to get up and do more again" PT Goal  Formulation: With patient/family Time For Goal Achievement: 02/02/17 Potential to Achieve Goals: Good Progress towards PT goals: Progressing toward goals    Frequency    BID      PT Plan Current plan remains appropriate    Co-evaluation             End of Session Equipment Utilized During Treatment: Gait belt Activity Tolerance: Patient limited by lethargy Patient left: in bed;with call bell/phone within reach;with chair alarm set;with family/visitor present Nurse Communication:  (increased  lethargy, decreased functional performance this date) PT Visit Diagnosis: Pain;Difficulty in walking, not elsewhere classified (R26.2);Muscle weakness (generalized) (M62.81) Pain - Right/Left: Right Pain - part of body: Hip     Time: 1610-9604 PT Time Calculation (min) (ACUTE ONLY): 26 min  Charges:  $Therapeutic Exercise: 8-22 mins $Therapeutic Activity: 8-22 mins                    G Codes:       Hannah Kennedy, PT, DPT, NCS 01/21/17, 9:40 AM [phone]

## 2017-01-21 NOTE — Progress Notes (Signed)
Admit to unit from [place] via stretcher. Oriented to unit, rehab schedule and care orders with plan of care. Stated understanding of information reviewed. Call bell in reach and waiting on luch tray. Pamelia HoitSharp, Dhara Schepp B

## 2017-01-21 NOTE — H&P (Signed)
Physical Medicine and Rehabilitation Admission H&P    CC: Right hip AVN in patient with spatic right hemiparesis.    HPI:  Hannah Kennedy is a 31 y.o. 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 History taken from chart review. Patient/family elected on surgical intervention and temporary IVC filter placed prior to admission. She was admitted to [facility] on 01/18/17 for anterior R-THR by Dr. Rudene Christians. Post op limited by increase in tone as well as pain. Noted to have hypotension post procedure as well as leucocytosis. Therapy ongoing and patient showing improvement in activity tolerance. CIR recommended for follow up therapy.    Review of Systems  HENT: Negative for hearing loss and tinnitus.   Eyes: Negative for blurred vision and double vision.  Respiratory: Negative for cough, sputum production and shortness of breath.   Cardiovascular: Negative for chest pain and palpitations.  Gastrointestinal: Negative for constipation, heartburn and nausea.  Genitourinary: Positive for frequency and urgency (had difficutly with bladder control. ).  Musculoskeletal: Positive for joint pain. Negative for back pain and myalgias.  Skin: Negative for rash.  Neurological: Positive for dizziness (with therapy) and weakness. Negative for tingling, sensory change and headaches.  Psychiatric/Behavioral: Negative for depression. The patient is not nervous/anxious and does not have insomnia.   All other systems reviewed and are negative.   Past Medical History:  Diagnosis Date  . Arthritis   . Cerebral palsy ([place])    with hydrocephalus  . Chronic anterior uveitis   . Depression   . GERD (gastroesophageal reflux disease)   . Glaucoma   . Hemiparesis ([place])    right side  . 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 Pulmonary emboli ([place])   . Schizophrenia ([place])   . Vitamin B 12 deficiency      Past Surgical History:  Procedure Laterality Date  . CSF SHUNT    . IVC FILTER INSERTION    . IVC FILTER INSERTION N/A 01/13/2017   Procedure: IVC Filter Insertion;  Surgeon: Algernon Huxley, MD;  Location: [facility];  Service: Cardiovascular;  Laterality: N/A;  . TOTAL HIP ARTHROPLASTY Right 01/18/2017   Procedure: TOTAL HIP ARTHROPLASTY ANTERIOR APPROACH;  Surgeon: Hessie Knows, MD;  Location: [facility] ORS;  Service: Orthopedics;  Laterality: Right;    Family History  Problem Relation Age of Onset  . Family history unknown: Yes    Social History:  Lives in a group home and was independent for basic transfers and to walk short distances? Was living with her grandmother till 1-2 years ago? Marland Kitchen Uses motorized wheelchair of mobility.  She reports that she has never smoked. She has never used smokeless tobacco. She reports that she does not drink alcohol or use drugs.   Allergies  Allergen Reactions  . Sulfamethoxazole-Trimethoprim Other (See Comments)    Other reaction(s): GI upset    Facility-Administered Medications Prior to Admission  Medication Dose Route Frequency Provider Last Rate Last Dose  . ceFAZolin (ANCEF) IVPB 1 g/50 mL premix  1 g Intravenous Once Algernon Huxley, MD       Medications Prior to Admission  Medication Sig Dispense Refill  . Adalimumab (HUMIRA PEN) 40 MG/0.8ML PNKT INJECT ONE pen Subcutaneously EVERY other WEEK    . Benzoyl Peroxide (PANOXYL WASH EX) Apply 1 Dose topically daily as needed (facial).     . brimonidine (ALPHAGAN) 0.2 %  ophthalmic solution Place 1 drop into both eyes 3 (three) times daily.    . Chlorhexidine Gluconate (DYNA-HEX 2 EX) Apply 1 Dose topically daily as needed. 4    . chlorproMAZINE (THORAZINE) 50 MG tablet Take 100 mg by mouth every morning.     . Docusate Calcium (STOOL SOFTENER PO) Take 1 capsule by mouth daily.    . fluticasone (FLONASE) 50 MCG/ACT nasal spray Place 1 spray into both nostrils.     . folic acid (FOLVITE) 1 MG  tablet Take by mouth.    . ibuprofen (ADVIL,MOTRIN) 600 MG tablet Take 600 mg by mouth.    . latanoprost (XALATAN) 0.005 % ophthalmic solution Place 1 drop into both eyes.     . Levonorgestrel (MIRENA, 52 MG, IU) by Intrauterine route.    . methotrexate (RHEUMATREX) 2.5 MG tablet Take 6 tablets (15 mg) every WEEK for 2 weeks. If tolerating well, increase to 8 tablets (20 mg) every WEEK    . Miconazole Nitrate (LOTRIMIN AF) 2 % AERO Apply topically.    . omeprazole (PRILOSEC) 20 MG capsule Take by mouth.    . oxyCODONE (OXY IR/ROXICODONE) 5 MG immediate release tablet Take 1-2 tablets (5-10 mg total) by mouth every 3 (three) hours as needed for breakthrough pain. 30 tablet 0  . paliperidone (INVEGA SUSTENNA) 234 MG/1.5ML SUSP injection Inject into the muscle.    . Paliperidone Palmitate (INVEGA TRINZA) 410 MG/1.315ML SUSP     . predniSONE (DELTASONE) 10 MG tablet Take by mouth.    . rivaroxaban (XARELTO) 10 MG TABS tablet Take 1 tablet (10 mg total) by mouth daily. 42 tablet 0  . sertraline (ZOLOFT) 50 MG tablet Take by mouth.    . timolol (TIMOPTIC) 0.25 % ophthalmic solution Place 1 drop into both eyes.     . vitamin B-12 (CYANOCOBALAMIN) 1000 MCG tablet Take 1,000 mcg by mouth daily.       Home: Home Living Family/patient expects to be discharged to:: Group home Living Arrangements: Group Home Available Help at Discharge: Available 24 hours/day Type of Home: Group Home Home Access: Ramped entrance Home Layout: One level Additional Comments: Plans are to return to group home after rehab discharge  Lives With: Other (Comment) (Has a roommate in her room.)   Functional History: Prior Function Level of Independence: Needs assistance ADL's / Homemaking Assistance Needed: Pt was independent in bathing and dressing but needed supervision to get to shower only. Comments: Assist for ADLs (recently sponge bathing primarily); mod indep for household mobility (recent use of 4WRW due to R hip  pain).  Does endorse 2-3 falls in previous six months related to R hip pain.  Functional Status:  Mobility: Bed Mobility Overal bed mobility: Needs Assistance Bed Mobility: Supine to Sit Supine to sit: Mod assist, Max assist General bed mobility comments: increased lethargy evident this date; decreased response time, task initiation noted Transfers Overall transfer level: Needs assistance Equipment used: Bilateral platform walker Transfers: Lateral/Scoot Transfers Sit to Stand: Max assist, Mod assist, +2 physical assistance General transfer comment: scoot pivot over level surfaces; extensive assist for lift off and lateral movement, delayed task initiation and movement planning this date Ambulation/Gait Ambulation/Gait assistance: Mod assist, +2 physical assistance Ambulation Distance (Feet): 10 Feet Assistive device: Bilateral platform walker General Gait Details: unsafe/unable to tolerate this AM due to lethargy    ADL: ADL Overall ADL's : Needs assistance/impaired Eating/Feeding: Set up, Minimal assistance Grooming: Wash/dry hands, Wash/dry face, Oral care, Applying deodorant, Brushing hair, Set up   Upper Body Dressing : Set up, Min guard Lower Body Dressing: Moderate assistance, Set up, Cueing for sequencing, With adaptive equipment Lower Body Dressing Details (indicate cue type and reason): cues and set up needed for use of reacher and sock aid while seated for LLE only Toilet Transfer: +2 for safety/equipment, Set up, Stand-pivot, BSC, +2 for physical assistance, Moderate assistance Toilet Transfer Details (indicate cue type and reason): mod assist of 1 person and max of the other to assist with weight shifting and turns with max cues to stand upright for transfer to commode  Cognition: Cognition Overall Cognitive Status: Within Functional Limits for tasks assessed Orientation Level: Oriented X4 Cognition Arousal/Alertness: Awake/alert Behavior During Therapy: WFL for  tasks assessed/performed Overall Cognitive Status: Within Functional Limits for tasks assessed   Blood pressure 115/63, pulse (!) 118, temperature 97.8 F (36.6 C), temperature source Oral, resp. rate 18, height 5' 2" (1.575 m), weight 61.2 kg (135 lb), SpO2 99 %. Physical Exam  Nursing note and vitals reviewed. Constitutional: She is oriented to person, place, and time. She appears well-developed and well-nourished.  HENT:  Head: Normocephalic and atraumatic.  Mouth/Throat: No oropharyngeal exudate.  Eyes: Conjunctivae and EOM are normal. Pupils are equal, round, and reactive to light.  Neck: Normal range of motion. Neck supple.  Cardiovascular: Normal rate and regular rhythm.   No murmur heard. Respiratory: Effort normal and breath sounds normal. No stridor. She has no wheezes.  GI: Soft. Bowel sounds are normal. She exhibits no distension. There is no tenderness.  Musculoskeletal: She exhibits edema and tenderness.  BLE externally rotated RLE Genuvalgum Moderate edema and pain with minimal movements R>L LE.  Neurological: She is alert and oriented to person, place, and time.  Speech clear.  Able to follow basic commands without difficulty.  RLE limited due to pain/anxiety.  Motor: RUE 4/5, mAS: 1+/4 elbow flexors, 3/4 wrist flexors, 1/4 finger flexors LUE: 4/5 proximal to distal LLE: HF, KE 3/5, ADF/PF 4/5  Skin: Skin is warm and dry.  Right hip incision with prevena VAC in place.     Results for orders placed or performed during the hospital encounter of 01/18/17 (from the past 48 hour(s))  CBC     Status: Abnormal   Collection Time: 01/20/17  4:14 AM  Result Value Ref Range   WBC 18.2 (H) 3.6 - 11.0 K/uL   RBC 3.92 3.80 - 5.20 MIL/uL   Hemoglobin 11.4 (L) 12.0 - 16.0 g/dL   HCT 33.2 (L) 35.0 - 47.0 %   MCV 84.6 80.0 - 100.0 fL   MCH 29.1 26.0 - 34.0 pg   MCHC 34.4 32.0 - 36.0 g/dL   RDW 13.9 11.5 - 14.5 %   Platelets 226 150 - 440 K/uL  Basic metabolic panel      Status: Abnormal   Collection Time: 01/20/17  4:14 AM  Result Value Ref Range   Sodium 136 135 - 145 mmol/L   Potassium 4.2 3.5 - 5.1 mmol/L   Chloride 102 101 - 111 mmol/L   CO2 27 22 - 32 mmol/L   Glucose, Bld 143 (H) 65 - 99 mg/dL   BUN 14 6 - 20 mg/dL   Creatinine, Ser 0.72 0.44 - 1.00 mg/dL   Calcium 9.0 8.9 - 10.3 mg/dL   GFR calc non Af Amer >60 >60 mL/min   GFR calc Af Amer >60 >60 mL/min    Comment: (NOTE) The eGFR has been calculated using the CKD EPI equation. This calculation has not   been validated in all clinical situations. eGFR's persistently <60 mL/min signify possible Chronic Kidney Disease.    Anion gap 7 5 - 15   No results found.   Medical Problem List and Plan: 1.  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 2.  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 Pain Management: Will schedule tramadol qid to decrease use of oxycodone. Will schedule baclofen at bedtime to help with spasticity and titrate upwards as tolerated 4. Mood: LCSW to follow for evaluation and support.  5. Neuropsych: This patient is not fully capable of making decisions on her own behalf. 6. Skin/Wound Care: Continue VAC for 7 days. Maintain adequate nutritional and hydration status 7. Fluids/Electrolytes/Nutrition: Monitor I/O. Check lytes in am.  8. Leucocytosis: will check UA/UCS--has urge incontinence at baseline. 9. ABLA: Recheck in am.  10. Glaucoma: Vision stable on current regimen--Timoptic, Xalatan,   11. Schizophrenia: Managed on [place] and thorazine daily  12. Uveitis: Will consider resuming meds   Post Admission Physician Evaluation: 1. Preadmission assessment reviewed and changes made below. 2. Functional deficits secondary to right hip AVN s/p THA in patient with spatic right hemiparesis secondary to CP. 3. Patient is admitted to receive  collaborative, interdisciplinary care between the physiatrist, rehab nursing staff, and therapy team. 4. Patient's level of medical complexity and substantial therapy needs in context of that medical necessity cannot be provided at a lesser intensity of care such as a SNF. 5. Patient has experienced substantial functional loss from his/her baseline which was documented above under the "Functional History" and "Functional Status" headings.  Judging by the patient's diagnosis, physical exam, and functional history, the patient has potential for functional progress which will result in measurable gains while on inpatient rehab.  These gains will be of substantial and practical use upon discharge  in facilitating mobility and self-care at the household level. 6. Physiatrist will provide 24 hour management of medical needs as well as oversight of the therapy plan/treatment and provide guidance as appropriate regarding the interaction of the two. 7. The Preadmission Screening has been reviewed and patient status is unchanged unless otherwise stated above. 8. 24 hour rehab nursing will assist with bladder management, safety, skin/wound care, disease management, pain management and patient education  and help integrate therapy concepts, techniques,education, etc. 9. PT will assess and treat for/with: Lower extremity strength, range of motion, stamina, balance, functional mobility, safety, adaptive techniques and equipment, woundcare, coping skills, pain control, education.   Goals are: Supervision. 10. OT will assess and treat for/with: ADL's, functional mobility, safety, upper extremity strength, adaptive techniques and equipment, wound mgt, ego support, and community reintegration.   Goals are: Supervision/Min A. Therapy may not proceed with showering this patient. 11. Case Management and Social Worker will assess and treat for psychological issues and discharge planning. 12. Team conference will be held weekly  to assess progress toward goals and to determine barriers to discharge. 13. Patient will receive at least 3 hours of therapy per day at least 5 days per week. 14. ELOS: 14-17 days.       15. Prognosis:  good  Delice Lesch, MD, [address] [place] 01/21/2017

## 2017-01-21 NOTE — PMR Pre-admission (Signed)
[facility] PMR Admission Coordinator Pre-Admission Assessment  Patient: Hannah Kennedy is an 31 y.o., female MRN: [id number] DOB: 04-Sep-1986 Height: 5\' 2"  (157.5 cm) Weight: 61.2 kg (135 lb)  Insurance Information HMO: No   PPO:       PCP:       IPA:       80/20:       OTHER:   PRIMARY: Medicaid of [place]      Policy#: [id number]      Subscriber: Prudence Davidson CM Name:        Phone#:       Fax#:   Pre-Cert#:        Employer:  Unemployed/Disabled Benefits:  Phone #:  [phone]     Name:  Automated Eff. Date:  01/20/17 with coverage code SADCN     Deduct:        Out of Pocket Max:        Life Max:   CIR:        SNF:   Outpatient:       Co-Pay:   Home Health:        Co-Pay:   DME:       Co-Pay:   Providers:    Emergency Contact Information Contact Information    Name Relation Home Work Mobile   Woods,Christine R Legal Guardian [phone]  [phone]      Current Medical History  Patient Admitting Diagnosis:  R THR, CP, Schizophrenia, medication induced parkinsonism  History of Present Illness:  A 31 yo female admitted to [facility] on 01/18/17.  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  She has a history of CP with R hemiparesis, medications-induced parkinsonism, schizophrenia, PE/DVT s/p IVC filter placement on 01/13/17.  She lives in [facility] and has a roommate.  Per group home attendant, pt wheelchair bound at baseline with 10% ambulation and limited endurance. Appears to be some conflicting history on chart review. She has limitations in RUE and hand due to contractures and increased tone from CP but is able to use a platform walker for mobility with +2 assist mod to max assist and max cues.  She is currently receiving PT and OT.  She will be admitted to CIR for continue rehabilitation and decrease burden of care prior to returning to the group home.  Patient's medical record from  [facility] has been reviewed by the rehabilitation admission coordinator and physician.  Past Medical History  Past Medical History:  Diagnosis Date  . Arthritis   . Cerebral palsy (HCC)   . Depression   . GERD (gastroesophageal reflux disease)   . Glaucoma   . Hemiparesis (HCC)    right side  . 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 Pulmonary emboli (HCC)   . Schizophrenia (HCC)     Family History   Family history is unknown by patient.  Prior Rehab/Hospitalizations Has the patient had major surgery during 100 days prior to admission? No    Current Medications See MAR [facility]  Patients Current Diet:   Regular diet, thin liquids  Precautions / Restrictions Precautions Precautions: Fall, Anterior Hip Precaution Comments: R hip wound vac Restrictions Weight Bearing Restrictions: Yes RLE Weight Bearing: Weight bearing as tolerated   Has the patient had 2 or more falls or a fall with injury in the past year?Yes.  Reports at least 3-4 falls with scrapes on legs  and hand  Prior Activity Level Community (5-7x/wk): Went to "camp" daily 6 am to 4 pm by [employer].  Did activities, games, etc at camp  Prior Functional Level Self Care: Did the patient need help bathing, dressing, using the toilet or eating?  Independent.  She needed supervision for showering.  Indoor Mobility: Did the patient need assistance with walking from room to room (with or without device)? Independent  Stairs: Did the patient need assistance with internal or external stairs (with or without device)? Dependent  Functional Cognition: Did the patient need help planning regular tasks such as shopping or remembering to take medications? Needed some help  Home Assistive Devices / Equipment Home Assistive Devices/Equipment: Dan Humphreys (specify type) (Seated walker)  Prior Device Use: Indicate devices/aids used by the patient prior to current illness, exacerbation or injury? Walker( seated  walker)   Prior Functional Level Current Functional Level  Bed Mobility  Mod Independent  Mod assist   Transfers  Mod Independent  Mod assist   Mobility - Walk/Wheelchair  Mod Independent  Mod assist (Ambulated 10 feet BPFRW)   Upper Body Dressing  Independent  Min assist   Lower Body Dressing  Independent  Mod assist   Grooming  Independent  Other (Set up)   Eating/Drinking  Independent  Min assist   Toilet Transfer  Mod Independent  Mod assist   Bladder Continence   Wears pullups  Incontinence, wears pullups   Bowel Management  WDL  Last BM documented 01/21/17   Stair Climbing  Dependent Other (Not tested)   Communication  Intact, A & O  Verbal, A & O   Memory  Mild impairment  Mild impairment   Cooking/Meal Prep  Dependent      Housework  Dependent    Money Management  Dependent    Driving  Not driving, rides in a van      Special needs/care consideration BiPAP/CPAP No CPM No Continuous Drip IV No Dialysis No       Life Vest No Oxygen No Special Bed No Trach Size No Wound Vac (area) Yes to right hip, to remain until it beeps and then discontinue VAC     Skin Right hip incision post surgical                           Bowel mgmt: Last BM 01/21/17 Bladder mgmt: Incontinence  Diabetic mgmt No  Previous Home Environment Living Arrangements: Group Home  Lives With: Other (Comment) (Has a roommate in her room.) Available Help at Discharge: Available 24 hours/day Type of Home: Group Home Care Facility [facility]: [facility] Layout: One level Home Access: Ramped entrance Home Care Services: Other (Comment) Additional Comments: Plans are to return to group home after rehab discharge  Discharge Living Setting Plans for Discharge Living Setting: Other (Comment) (Group home at discharge.) Type of Home at Discharge: Group Home Care Facility Name at Discharge: [facility] Discharge Home Layout: One level Discharge Home  Access: Ramped entrance Does the patient have any problems obtaining your medications?: No  Social/Family/Support Systems Patient Roles: Other (Comment) (Grandmother is legal guardian, has mom as well.) Contact Information: Joya San - legal guardian - (h) [phone] (c) [phone] Anticipated Caregiver: Group home attendants Ability/Limitations of Caregiver: Group home can provide assistance Caregiver Availability: 24/7 Discharge Plan Discussed with Primary Caregiver: Yes Is Caregiver In Agreement with Plan?: Yes Does Caregiver/Family have Issues with Lodging/Transportation while Pt is in Rehab?:  No  Goals/Additional Needs Patient/Family Goal for Rehab: PT/OT supervision to min assist goals Expected length of stay: 7-10 days Cultural Considerations: None Dietary Needs: Regular diet, thin liquids Equipment Needs: TBD - likely will need a wheelchair Pt/Family Agrees to Admission and willing to participate: Yes Program Orientation Provided & Reviewed with Pt/Caregiver Including Roles  & Responsibilities: Yes  Patient Condition: [facility].  I have spoken with patient's guardian by phone.  Patient will benefit from 3 hours of therapy a day to regain function as quickly as possible.  She can tolerate therapies and is participating with therapies.  She is motivated to increase function prior to return to her group home setting. I have discussed all information with rehab MD and  I have approval from rehab MD to admit to inpatient rehab today.  Preadmission Screen Completed By:  Trish MageLogue, Eugenia M, 01/21/2017 11:10 AM ______________________________________________________________________   Discussed status with Dr.  Allena KatzPatel on  01/21/17 at 1110 and received telephone approval for admission today.  Admission Coordinator:  Trish MageLogue, Eugenia M, time 1110/Date 01/21/17   Assessment/Plan: Diagnosis: Right hip AVN s/p replacement   1. Does the need for close, 24  hr/day  Medical supervision in concert with the patient's rehab needs make it unreasonable for this patient to be served in a less intensive setting? Yes 2. Co-Morbidities requiring supervision/potential complications: PE/DVT (cont meds), R THR, CP, Schizophrenia, medication induced parkinsonism 3. Due to bladder management, bowel management, safety, skin/wound care, disease management, medication administration, pain management and patient education, does the patient require 24 hr/day rehab nursing? Yes 4. Does the patient require coordinated care of a physician, rehab nurse, PT (1-2 hrs/day, 5 days/week), OT (1-2 hrs/day, 5 days/week) and SLP (1-2 hrs/day, 5 days/week) to address physical and functional deficits in the context of the above medical diagnosis(es)? Yes Addressing deficits in the following areas: balance, endurance, locomotion, strength, transferring, bowel/bladder control, bathing, dressing, feeding, grooming, toileting, cognition, speech and psychosocial support 5. Can the patient actively participate in an intensive therapy program of at least 3 hrs of therapy 5 days a week? Yes 6. The potential for patient to make measurable gains while on inpatient rehab is excellent 7. Anticipated functional outcomes upon discharge from inpatients are: supervision and min assist PT, supervision and min assist OT, supervision and min assist SLP 8. Estimated rehab length of stay to reach the above functional goals is: 14-18 days. 9. Does the patient have adequate social supports to accommodate these discharge functional goals? Yes 10. Anticipated D/C setting: Other 11. Anticipated post D/C treatments: Group home 12. Overall Rehab/Functional Prognosis: good and fair    RECOMMENDATIONS: This patient's condition is appropriate for continued rehabilitative care in the following setting: CIR Patient has agreed to participate in recommended program. Yes Note that insurance prior authorization may be  required for reimbursement for recommended care.  Comment: Rehab Admissions Coordinator to follow up.  Trish MageLogue, Eugenia M 01/21/2017  Maryla MorrowAnkit Shatiqua Heroux, MD, [place] DomFAAPMR

## 2017-01-21 NOTE — Progress Notes (Addendum)
Shift assessment completed at 0754. Pt is awake, alert and oriented, pt's mother is present. Pt 's mother stating that pt seems more tired than usual. Vs checked and are stable,after discussion, this Clinical research associatewriter offers that pt is possibly fatigued from a continual stream of visitors over the past few days. Mother agreed this may be it. Pt lungs are clear, pt on room air, HR is regular, abdomen is soft, bs heard. Pt is oob to commode to void,ppp, no edema to lower legs, teds on bilat.Provena wound vac in place To r hip,dressing intact. PIV #20 intact to l fa, site free of redness and swelling. Since assessment, pt received [facility]. This Clinical research associatewriter removed piv with catheter intact after pt worked with physical therapy. This Clinical research associatewriter called [facility], and also gave [facility] after completing paperwork. Of note, scripts sent with pt for xarelto and oxycodone. Pt received tylenol po just prior to transport.

## 2017-01-21 NOTE — Care Management (Signed)
  Patient suffers from right hip fx, cerebral palsy  which impairs their ability to perform daily activities like toileting, feeding, dressing, grooming, bathing in the home. A cane, walker, crutch will not resolve  issue with performing activities of daily living. A wheelchair will allow patient to safely perform daily activities.  Patient can safely propel the wheelchair in the home or has a caregiver who can provide assistance.

## 2017-01-21 NOTE — Discharge Summary (Signed)
Physician Discharge Summary  Patient ID: Hannah Kennedy MRN: [id number] DOB/AGE: [birth year] 30 y.o.  Admit date: 01/18/2017 Discharge date: 01/21/2017  Admission Diagnoses:  avascular necrosis of bone of right hip   Discharge Diagnoses: Patient Active Problem List   Diagnosis Date Noted  . 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 Cerebral palsy (HCC) 01/04/2017  . Schizophrenia (HCC) 01/04/2017  . Avascular necrosis of bone of hip, right (HCC) 01/04/2017  . Hx of pulmonary embolus 01/04/2017    Past Medical History:  Diagnosis Date  . Arthritis   . Cerebral palsy (HCC)   . Depression   . GERD (gastroesophageal reflux disease)   . Glaucoma   . Hemiparesis (HCC)    right side  . 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 Pulmonary emboli (HCC)   . Schizophrenia (HCC)      Transfusion: none   Consultants (if any):   Discharged Condition: Improved  Hospital Course: Hannah Kennedy is an 31 y.o. female who was admitted 01/18/2017 with a diagnosis of  Right hip AVN and went to the operating room on 01/18/2017 and underwent the above named procedures.    Surgeries: Procedure(s): TOTAL HIP ARTHROPLASTY ANTERIOR APPROACH on 01/18/2017 Patient tolerated the surgery well. Taken to PACU where she was stabilized and then transferred to the orthopedic floor.  Started on Lovenox 40 q 24 hrs. Foot pumps applied bilaterally at 80 mm. Heels elevated on bed with rolled towels. No evidence of DVT. Negative Homan. Physical therapy started on day #1 for gait training and transfer. OT started day #1 for ADL and assisted devices.  Patient's foley was d/c on day #1. Patient's IV and hemovac was d/c on day #2.  On post op day #3 patient was stable and ready for discharge to rehab.  Implants: AMIS 1 std, 46 mm Mpact cup DM with liner, 22.2 mm haed, size S She was given perioperative antibiotics:   Anti-infectives    Start     Dose/Rate Route Frequency Ordered Stop   01/18/17  1900  ceFAZolin (ANCEF) IVPB 1 g/50 mL premix     1 g 100 mL/hr over 30 Minutes Intravenous Every 6 hours 01/18/17 1558 01/19/17 0631   01/18/17 0912  ceFAZolin (ANCEF) 2-4 GM/100ML-% IVPB    Comments:  Kennedy, Hannah: cabinet override      01/18/17 0912 01/18/17 1313   01/18/17 0245  ceFAZolin (ANCEF) IVPB 2g/100 mL premix     2 g 200 mL/hr over 30 Minutes Intravenous  Once 01/18/17 0242 01/18/17 1328    .  She was given sequential compression devices, early ambulation, and Xarelto for DVT prophylaxis.  She benefited maximally from the hospital stay and there were no complications.    Recent vital signs:  Vitals:   01/21/17 0633 01/21/17 0754  BP: (!) 107/58 105/63  Pulse: 76 100  Resp:  16  Temp:  97.6 F (36.4 C)    Recent laboratory studies:  Lab Results  Component Value Date   HGB 11.4 (L) 01/20/2017   HGB 12.6 01/19/2017   HGB 13.2 01/18/2017   Lab Results  Component Value Date   WBC 18.2 (H) 01/20/2017   PLT 226 01/20/2017   Lab Results  Component Value Date   INR 1.07 01/14/2017   Lab Results  Component Value Date   NA 136 01/20/2017   K 4.2 01/20/2017   CL 102 01/20/2017   CO2 27 01/20/2017   BUN 14 01/20/2017   CREATININE 0.72  01/20/2017   GLUCOSE 143 (H) 01/20/2017    Discharge Medications:   Allergies as of 01/21/2017      Reactions   Sulfamethoxazole-trimethoprim    Other reaction(s): Other (See Comments) Other reaction(s): GI upset      Medication List    STOP taking these medications   traMADol 50 MG tablet Commonly known as:  ULTRAM     TAKE these medications   brimonidine 0.2 % ophthalmic solution Commonly known as:  ALPHAGAN Place 1 drop into both eyes 3 (three) times daily.   chlorproMAZINE 50 MG tablet Commonly known as:  THORAZINE Take 100 mg by mouth every morning.   DYNA-HEX 2 EX Apply 1 Dose topically daily as needed. 4   fluticasone 50 MCG/ACT nasal spray Commonly known as:  FLONASE Place into the nose.    folic acid 1 MG tablet Commonly known as:  FOLVITE Take by mouth.   HUMIRA PEN 40 MG/0.8ML Pnkt Generic drug:  Adalimumab INJECT ONE pen Subcutaneously EVERY other WEEK   ibuprofen 600 MG tablet Commonly known as:  ADVIL,MOTRIN Take 600 mg by mouth.   INVEGA SUSTENNA 234 MG/1.5ML Susp injection Generic drug:  paliperidone Inject into the muscle.   INVEGA TRINZA 410 MG/1.315ML Susp Generic drug:  Paliperidone Palmitate   latanoprost 0.005 % ophthalmic solution Commonly known as:  XALATAN Place 1 drop into both eyes.   LOTRIMIN AF 2 % Aero Generic drug:  Miconazole Nitrate Apply topically.   methotrexate 2.5 MG tablet Commonly known as:  RHEUMATREX Take 6 tablets (15 mg) every WEEK for 2 weeks. If tolerating well, increase to 8 tablets (20 mg) every WEEK   MIRENA (52 MG) IU by Intrauterine route.   omeprazole 20 MG capsule Commonly known as:  PRILOSEC Take by mouth.   oxyCODONE 5 MG immediate release tablet Commonly known as:  Oxy IR/ROXICODONE Take 1-2 tablets (5-10 mg total) by mouth every 3 (three) hours as needed for breakthrough pain.   PANOXYL WASH EX Apply 1 Dose topically daily as needed.   predniSONE 10 MG tablet Commonly known as:  DELTASONE Take by mouth.   rivaroxaban 10 MG Tabs tablet Commonly known as:  XARELTO Take 1 tablet (10 mg total) by mouth daily.   sertraline 50 MG tablet Commonly known as:  ZOLOFT Take by mouth.   STOOL SOFTENER PO Take 1 capsule by mouth daily.   timolol 0.25 % ophthalmic solution Commonly known as:  TIMOPTIC Place 1 drop into both eyes.   vitamin B-12 1000 MCG tablet Commonly known as:  CYANOCOBALAMIN Take 1,000 mcg by mouth daily.            Durable Medical Equipment        Start     Ordered   01/20/17 1726  For home use only DME high strength lightweight manual wheelchair with seat cushion  Once    Comments:  With drop arm. Patient suffers from cerebral palsy which impairs their ability to  perform daily activities like bathing, dressing, feeding, grooming and toileting in the home.  A crutch or walker will not resolve  issue with performing activities of daily living. A wheelchair will allow patient to safely perform daily activities.  (THEN ONE OF THESE TWO:) Patient self-propels the wheelchair while engaging in frequent activities such as meals and toileting which cannot be performed in a standard or lightweight wheelchair due to the weight of the chair. Accessories: elevating leg rests (ELRs), wheel locks, extensions and anti-tippers.   01/20/17 1728  Diagnostic Studies: Dg Hip Operative Unilat W Or W/o Pelvis Right  Result Date: 01/18/2017 CLINICAL DATA:  Right hip replacement EXAM: OPERATIVE right HIP (WITH PELVIS IF PERFORMED) 7 VIEWS TECHNIQUE: Fluoroscopic spot image(s) were submitted for interpretation post-operatively. COMPARISON:  MR pelvis of 12/20/2016 FINDINGS: C-arm spot films were obtained showing changes of avascular necrosis involving the right femoral head. The components of the right hip replacement appear to be in good position with no complicating features noted. IMPRESSION: Right hip replacement performed without complicating features. The entire femoral stem is not included on the images obtained. Electronically Signed   By: Dwyane Dee M.D.   On: 01/18/2017 14:32   Dg Hip Unilat W Or W/o Pelvis 2-3 Views Right  Result Date: 01/18/2017 CLINICAL DATA:  Right hip replacement . EXAM: DG HIP (WITH OR WITHOUT PELVIS) 2-3V RIGHT COMPARISON:  MRI 01/29/ 2018 . FINDINGS: Total right hip replacement. Hardware intact. Anatomic alignment. No acute bony abnormality. IMPRESSION: Total right hip replacement.  Anatomic alignment . Electronically Signed   By: Maisie Fus  Register   On: 01/18/2017 15:27    Disposition: 01-Home or Self Care    Follow-up Information    MENZ,MICHAEL, MD Follow up in 2 week(s).   Specialty:  Orthopedic Surgery Contact information: [address] [place] [address] [phone]            Signed: Patience Musca 01/21/2017, 9:45 AM

## 2017-01-21 NOTE — Progress Notes (Signed)
   Subjective: 3 Days Post-Op Procedure(s) (LRB): TOTAL HIP ARTHROPLASTY ANTERIOR APPROACH (Right) Patient reports pain as mild.  Pain improving. Patient is well, and has had no acute complaints or problems Denies any CP, SOB, ABD pain. We will continue therapy today.    Objective: Vital signs in last 24 hours: Temp:  [97.6 F (36.4 C)-99.5 F (37.5 C)] 97.6 F (36.4 C) (03/02 0754) Pulse Rate:  [76-100] 100 (03/02 0754) Resp:  [16-18] 16 (03/02 0754) BP: (88-107)/(51-63) 105/63 (03/02 0754) SpO2:  [95 %-97 %] 97 % (03/02 0754)  Intake/Output from previous day: 03/01 0701 - 03/02 0700 In: 600 [P.O.:600] Out: -  Intake/Output this shift: No intake/output data recorded.   Recent Labs  01/18/17 1625 01/19/17 0419 01/20/17 0414  HGB 13.2 12.6 11.4*    Recent Labs  01/19/17 0419 01/20/17 0414  WBC 15.2* 18.2*  RBC 4.34 3.92  HCT 36.9 33.2*  PLT 237 226    Recent Labs  01/19/17 0419 01/20/17 0414  NA 137 136  K 4.4 4.2  CL 103 102  CO2 25 27  BUN 7 14  CREATININE 0.62 0.72  GLUCOSE 146* 143*  CALCIUM 9.4 9.0   No results for input(s): LABPT, INR in the last 72 hours.  EXAM General - Patient is Alert, Appropriate and Oriented Extremity - Neurovascular intact Sensation intact distally Intact pulses distally Dorsiflexion/Plantar flexion intact No cellulitis present Compartment soft Dressing - dressing C/D/I and no drainage, wound vac intact, hemovac removed Motor Function - intact, minimal right ankle ROM at baseline  Past Medical History:  Diagnosis Date  . Arthritis   . Cerebral palsy (HCC)   . Depression   . GERD (gastroesophageal reflux disease)   . Glaucoma   . Hemiparesis (HCC)    right side  . 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 Pulmonary emboli (HCC)   . Schizophrenia (HCC)     Assessment/Plan:   3 Days Post-Op Procedure(s) (LRB): TOTAL HIP ARTHROPLASTY ANTERIOR APPROACH (Right) Active Problems:   Drug-induced osteonecrosis of  femur, right (HCC)  Estimated body mass index is 24.69 kg/m as calculated from the following:   Height as of 01/14/17: 5\' 2"  (1.575 m).   Weight as of 01/14/17: 61.2 kg (135 lb). Advance diet Up with therapy  CM to assist with discharge. Discharge home with HHPT Follow up with [facility] in 2 weeks Discharge home with xarelto, dc lovenox  DVT Prophylaxis - Lovenox, Foot Pumps and TED hose Weight-Bearing as tolerated to right leg   T. Cranston Neighborhris Gaines, PA-C [facility] 01/21/2017, 9:42 AM

## 2017-01-21 NOTE — Discharge Instructions (Signed)

## 2017-01-21 NOTE — Progress Notes (Signed)
Patient information reviewed and entered into eRehab system by Gearl Kimbrough, RN, CRRN, PPS Coordinator.  Information including medical coding and functional independence measure will be reviewed and updated through discharge.    

## 2017-01-21 NOTE — Progress Notes (Signed)
Per RN case manager [facility]. [name] group home owner is aware of above. Patient's guardian/ grandmother Wynona CanesChristine is aware of above. Please reconsult if future social work needs arise. CSW signing off.   Baker Hughes IncorporatedBailey Lynze Reddy, LCSW [phone]

## 2017-01-21 NOTE — Progress Notes (Signed)
Ankit Karis JubaAnil Patel, MD Physician Signed Physical Medicine and Rehabilitation  PMR Pre-admission Date of Service: 01/21/2017 10:42 AM  Related encounter: Admission (Discharged) from 01/18/2017 in [facility] (1A)       [] Hide copied text [] Hover for attribution information   Secondary Market PMR Admission Coordinator Pre-Admission Assessment  Patient: Hannah Kennedy is an 31 y.o., female MRN: [id number] DOB: 1986/09/20 Height: 5\' 2"  (157.5 cm) Weight: 61.2 kg (135 lb)  Insurance Information HMO: No   PPO:       PCP:       IPA:       80/20:       OTHER:   PRIMARY: Medicaid of [place]      Policy#: [id number]      Subscriber: Prudence DavidsonLatoya Kennedy CM Name:        Phone#:       Fax#:   Pre-Cert#:        Employer:  Unemployed/Disabled Benefits:  Phone #:  [phone]     Name:  Automated Eff. Date:  01/20/17 with coverage code SADCN     Deduct:        Out of Pocket Max:        Life Max:   CIR:        SNF:   Outpatient:       Co-Pay:   Home Health:        Co-Pay:   DME:       Co-Pay:   Providers:    Emergency Contact Information        Contact Information    Name Relation Home Work Mobile   Kennedy,Hannah R Legal Guardian [phone]  [phone]      Current Medical History  Patient Admitting Diagnosis:  R THR, CP, Schizophrenia, medication induced parkinsonism  History of Present Illness:  A 31 yo female admitted to [facility] on 01/18/17.  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  She has a history of CP with R hemiparesis, medications-induced parkinsonism, schizophrenia, PE/DVT s/p IVC filter placement on 01/13/17.  [facility] and has a roommate.  Per group home attendant, pt wheelchair bound at baseline with 10% ambulation and limited endurance. Appears to be some conflicting history on chart review. She has limitations in RUE and hand due to contractures and  increased tone from CP but is able to use a platform walker for mobility with +2 assist mod to max assist and max cues.  She is currently receiving PT and OT.  She will be admitted to CIR for continue rehabilitation and decrease burden of care prior to returning to the group home.  Patient's medical record from  [facility] has been reviewed by the rehabilitation admission coordinator and physician.  Past Medical History      Past Medical History:  Diagnosis Date  . Arthritis   . Cerebral palsy (HCC)   . Depression   . GERD (gastroesophageal reflux disease)   . Glaucoma   . Hemiparesis (HCC)    right side  . 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 Pulmonary emboli (HCC)   . Schizophrenia (HCC)     Family History   Family history is unknown by patient.  Prior Rehab/Hospitalizations Has the patient had major surgery during 100 days prior to admission? No               Current Medications See  MAR from [facility]  Patients Current Diet:   Regular diet, thin liquids  Precautions / Restrictions Precautions Precautions: Fall, Anterior Hip Precaution Comments: R hip wound vac Restrictions Weight Bearing Restrictions: Yes RLE Weight Bearing: Weight bearing as tolerated   Has the patient had 2 or more falls or a fall with injury in the past year?Yes.  Reports at least 3-4 falls with scrapes on legs and hand  Prior Activity Level Community (5-7x/wk): Went to "camp" daily 6 am to 4 pm by [employer].  Did activities, games, etc at camp  Prior Functional Level Self Care: Did the patient need help bathing, dressing, using the toilet or eating?  Independent.  She needed supervision for showering.  Indoor Mobility: Did the patient need assistance with walking from room to room (with or without device)? Independent  Stairs: Did the patient need assistance with internal or external stairs (with or without device)? Dependent  Functional  Cognition: Did the patient need help planning regular tasks such as shopping or remembering to take medications? Needed some help  Home Assistive Devices / Equipment Home Assistive Devices/Equipment: Dan Humphreys (specify type) (Seated walker)  Prior Device Use: Indicate devices/aids used by the patient prior to current illness, exacerbation or injury? Walker( seated walker)   Prior Functional Level Current Functional Level  Bed Mobility  Mod Independent  Mod assist   Transfers  Mod Independent  Mod assist   Mobility - Walk/Wheelchair  Mod Independent  Mod assist (Ambulated 10 feet BPFRW)   Upper Body Dressing  Independent  Min assist   Lower Body Dressing  Independent  Mod assist   Grooming  Independent  Other (Set up)   Eating/Drinking  Independent  Min assist   Toilet Transfer  Mod Independent  Mod assist   Bladder Continence   Wears pullups  Incontinence, wears pullups   Bowel Management  WDL  Last BM documented 01/21/17   Stair Climbing  Dependent Other (Not tested)   Communication  Intact, A & O  Verbal, A & O   Memory  Mild impairment  Mild impairment   Cooking/Meal Prep  Dependent      Housework  Dependent    Money Management  Dependent    Driving  Not driving, rides in a van      Special needs/care consideration BiPAP/CPAP No CPM No Continuous Drip IV No Dialysis No       Life Vest No Oxygen No Special Bed No Trach Size No Wound Vac (area) Yes to right hip, to remain until it beeps and then discontinue VAC     Skin Right hip incision post surgical                           Bowel mgmt: Last BM 01/21/17 Bladder mgmt: Incontinence    Diabetic mgmt No  Previous Home Environment Living Arrangements: Group Home  Lives With: Other (Comment) (Has a roommate in her room.) Available Help at Discharge: Available 24 hours/day Type of Home: Group Home Care Facility Name: [facility] Layout: One level Home Access: Ramped entrance Home Care Services: Other (Comment) Additional Comments: Plans are to return to group home after rehab discharge  Discharge Living Setting Plans for Discharge Living Setting: Other (Comment) (Group home at discharge.) Type of Home at Discharge: Group Home Care [facility] at Discharge: [facility] Discharge Home Layout: One level Discharge Home Access: Ramped entrance Does the patient have any problems obtaining  your medications?: No  Social/Family/Support Systems Patient Roles: Other (Comment) (Grandmother is legal guardian, has mom as well.) Contact Information: Hannah Kennedy - legal guardian - (h) [phone] (c) [phone] Anticipated Caregiver: Group home attendants Ability/Limitations of Caregiver: Group home can provide assistance Caregiver Availability: 24/7 Discharge Plan Discussed with Primary Caregiver: Yes Is Caregiver In Agreement with Plan?: Yes Does Caregiver/Family have Issues with Lodging/Transportation while Pt is in Rehab?: No  Goals/Additional Needs Patient/Family Goal for Rehab: PT/OT supervision to min assist goals Expected length of stay: 7-10 days Cultural Considerations: None Dietary Needs: Regular diet, thin liquids Equipment Needs: TBD - likely will need a wheelchair Pt/Family Agrees to Admission and willing to participate: Yes Program Orientation Provided & Reviewed with Pt/Caregiver Including Roles  & Responsibilities: Yes  Patient Condition: I have reviewed all progress from [facility].  I have spoken with patient's guardian by phone.  Patient will benefit from 3 hours of therapy a day to regain function as quickly as possible.  She can tolerate therapies and is participating with therapies.  She is motivated to increase function prior to return to her group home setting. I have discussed all information with rehab MD and  I have approval from rehab MD to admit to inpatient rehab  today.  Preadmission Screen Completed By:  Trish Mage, 01/21/2017 11:10 AM ______________________________________________________________________   Discussed status with Dr.  Allena Katz on  01/21/17 at 1110 and received telephone approval for admission today.  Admission Coordinator:  Trish Mage, time 1110/Date 01/21/17   Assessment/Plan: Diagnosis: Right hip AVN s/p replacement   1. Does the need for close, 24 hr/day  Medical supervision in concert with the patient's rehab needs make it unreasonable for this patient to be served in a less intensive setting? Yes 2. Co-Morbidities requiring supervision/potential complications: PE/DVT (cont meds), R THR, CP, Schizophrenia, medication induced parkinsonism 3. Due to bladder management, bowel management, safety, skin/wound care, disease management, medication administration, pain management and patient education, does the patient require 24 hr/day rehab nursing? Yes 4. Does the patient require coordinated care of a physician, rehab nurse, PT (1-2 hrs/day, 5 days/week), OT (1-2 hrs/day, 5 days/week) and SLP (1-2 hrs/day, 5 days/week) to address physical and functional deficits in the context of the above medical diagnosis(es)? Yes Addressing deficits in the following areas: balance, endurance, locomotion, strength, transferring, bowel/bladder control, bathing, dressing, feeding, grooming, toileting, cognition, speech and psychosocial support 5. Can the patient actively participate in an intensive therapy program of at least 3 hrs of therapy 5 days a week? Yes 6. The potential for patient to make measurable gains while on inpatient rehab is excellent 7. Anticipated functional outcomes upon discharge from inpatients are: supervision and min assist PT, supervision and min assist OT, supervision and min assist SLP 8. Estimated rehab length of stay to reach the above functional goals is: 14-18 days. 9. Does the patient have adequate social supports  to accommodate these discharge functional goals? Yes 10. Anticipated D/C setting: Other 11. Anticipated post D/C treatments: Group home 12. Overall Rehab/Functional Prognosis: good and fair    RECOMMENDATIONS: This patient's condition is appropriate for continued rehabilitative care in the following setting: CIR Patient has agreed to participate in recommended program. Yes Note that insurance prior authorization may be required for reimbursement for recommended care.  Comment: Rehab Admissions Coordinator to follow up.  Trish Mage 01/21/2017  [name], MD, [place]

## 2017-01-21 NOTE — Plan of Care (Signed)
Problem: Bowel/Gastric: Goal: Will not experience complications related to bowel motility Outcome: Completed/Met Date Met: 01/21/17 Pt has met all goals for discharge.

## 2017-01-21 NOTE — Care Management Note (Addendum)
Case Management Note  Patient Details  Name: Hannah DowseLatoya D Faxon MRN: [id number] Date of Birth: [birth year]  Subjective/Objective:  TC received from Walt Disneyenie Logub at [facility]. They can accept patient today. Notified Dr. Rosita KeaMenz and [place] Neighborhris Gaines, PA. Spoke with Christen Bameonnie and Joya Sanhristine Woods regarding transfer to [facility] and they are agreeable. Primary nurse updated and will call report to [phone]. Patient going to room 4W25. Case closed.                  Action/Plan: [facility]. [facility] notified of change in POC.   Expected Discharge Date:  01/21/17               Expected Discharge Plan:  IP Rehab Facility  In-House Referral:     Discharge planning Services  CM Consult  Post Acute Care Choice:   (CIR referral) [facility] offered to:  [facility] / Guardian  DME Arranged:  Wheelchair manual (with drop arm) DME Agency:     HH Arranged:    HH Agency:     Status of Service:  Completed, signed off  If discussed at [employer] Length of Stay Meetings, dates discussed:    Additional Comments:  Marily MemosLisa M Makih Stefanko, RN 01/21/2017, 10:52 AM

## 2017-01-21 NOTE — Progress Notes (Signed)
[place] inpatient rehab admissions - I spoke with patient's legal guardian, Hannah Kennedy, and she is agreeable to inpatient rehab admission [facility] for a week or two as needed before return to the group home.  I spoke with case manager and patient is medically ready for discharge and admit to inpatient rehab today.  Bed available and will admit to acute inpatient rehab today.  Call me for questions.  #[phone]

## 2017-01-22 ENCOUNTER — Inpatient Hospital Stay (HOSPITAL_COMMUNITY): Payer: Medicaid Other | Admitting: Occupational Therapy

## 2017-01-22 ENCOUNTER — Inpatient Hospital Stay (HOSPITAL_COMMUNITY): Payer: Medicaid Other | Admitting: Physical Therapy

## 2017-01-22 LAB — COMPREHENSIVE METABOLIC PANEL
ALBUMIN: 3 g/dL — AB (ref 3.5–5.0)
ALK PHOS: 57 U/L (ref 38–126)
ALT: 35 U/L (ref 14–54)
ANION GAP: 8 (ref 5–15)
AST: 30 U/L (ref 15–41)
BUN: 11 mg/dL (ref 6–20)
CALCIUM: 9.1 mg/dL (ref 8.9–10.3)
CO2: 29 mmol/L (ref 22–32)
Chloride: 101 mmol/L (ref 101–111)
Creatinine, Ser: 0.66 mg/dL (ref 0.44–1.00)
GFR calc Af Amer: >60 mL/min (ref >60)
GFR calc non Af Amer: >60 mL/min (ref >60)
GLUCOSE: 112 mg/dL — AB (ref 65–99)
Potassium: 3.9 mmol/L (ref 3.5–5.1)
SODIUM: 138 mmol/L (ref 135–145)
Total Bilirubin: 0.4 mg/dL (ref 0.3–1.2)
Total Protein: 6.3 g/dL — ABNORMAL LOW (ref 6.5–8.1)

## 2017-01-22 LAB — CBC WITH DIFFERENTIAL/PLATELET
BASOS PCT: 0 %
Basophils Absolute: 0 10*3/uL (ref 0.0–0.1)
EOS ABS: 0.1 10*3/uL (ref 0.0–0.7)
Eosinophils Relative: 1 %
HCT: 34.9 % — ABNORMAL LOW (ref 36.0–46.0)
HEMOGLOBIN: 11.2 g/dL — AB (ref 12.0–15.0)
Lymphocytes Relative: 40 %
Lymphs Abs: 3.6 10*3/uL (ref 0.7–4.0)
MCH: 28.1 pg (ref 26.0–34.0)
MCHC: 32.1 g/dL (ref 30.0–36.0)
MCV: 87.7 fL (ref 78.0–100.0)
MONOS PCT: 8 %
Monocytes Absolute: 0.7 10*3/uL (ref 0.1–1.0)
NEUTROS PCT: 51 %
Neutro Abs: 4.7 10*3/uL (ref 1.7–7.7)
Platelets: 229 10*3/uL (ref 150–400)
RBC: 3.98 MIL/uL (ref 3.87–5.11)
RDW: 13.8 % (ref 11.5–15.5)
WBC: 9.1 10*3/uL (ref 4.0–10.5)

## 2017-01-22 LAB — URINE CULTURE: Culture: <10000 — AB

## 2017-01-22 MED ORDER — PRO-STAT SUGAR FREE PO LIQD
30.0000 mL | Freq: Two times a day (BID) | ORAL | Status: DC
  Administered 2017-01-23 – 2017-02-07 (×8): 30 mL via ORAL
  Filled 2017-01-22 (×23): qty 30

## 2017-01-22 NOTE — Evaluation (Signed)
Physical Therapy Assessment and Plan  Patient Details  Name: Hannah Kennedy MRN: [id number] Date of Birth: Oct 10, 1986  PT Diagnosis: Difficulty walking, Muscle weakness and Pain in joint Rehab Potential: Good ELOS: 19-24 days    Today's Date: 01/22/2017 PT Individual Time: 1016-1130 PT Individual Time Calculation (min): 74 min    Problem List:  Patient Active Problem List   Diagnosis Date Noted  . Abnormality of gait   . History of DVT (deep vein thrombosis)   . Post-operative pain   . Spastic hemiplegia affecting nondominant side ([place])   . Leukocytosis   . Acute blood loss anemia   . Glaucoma   . Uveitis   . Status post total replacement of right hip   . 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 Cerebral palsy ([place]) 01/04/2017  . Schizophrenia ([place]) 01/04/2017  . Avascular necrosis of bone of hip, right ([place]) 01/04/2017  . Hx of pulmonary embolus 01/04/2017    Past Medical History:  Past Medical History:  Diagnosis Date  . Arthritis   . Cerebral palsy ([place])    with hydrocephalus  . Chronic anterior uveitis   . Depression   . GERD (gastroesophageal reflux disease)   . Glaucoma   . Hemiparesis ([place])    right side  . 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 Pulmonary emboli ([place])   . Schizophrenia ([place])   . Vitamin B 12 deficiency    Past Surgical History:  Past Surgical History:  Procedure Laterality Date  . CSF SHUNT    . IVC FILTER INSERTION    . IVC FILTER INSERTION N/A 01/13/2017   Procedure: IVC Filter Insertion;  Surgeon: Algernon Huxley, MD;  Location: [place] CV LAB;  Service: Cardiovascular;  Laterality: N/A;  . TOTAL HIP ARTHROPLASTY Right 01/18/2017   Procedure: TOTAL HIP ARTHROPLASTY ANTERIOR APPROACH;  Surgeon: Hessie Knows, MD;  Location: [facility] ORS;  Service: Orthopedics;  Laterality: Right;    Assessment & Plan Clinical Impression: Patient is a 31 y.o. year old female with history of CP with right hemiparesis, Schizophrenia,  parkinsonism due to meds, PE/DVT, right hip AVN with increase in pain and decline in mobility. History taken from chart review. Patient/family elected on surgical intervention and temporary IVC filter placed prior to admission. She was admitted to [facility] on 01/18/17 for anterior R-THR by Dr. Rudene Christians. Post op limited by increase in tone as well as pain. Noted to have hypotension post procedure as well as leucocytosis.  Patient transferred to CIR on 01/21/2017 .   Patient currently requires max with mobility secondary to muscle weakness and muscle joint tightness, decreased problem solving, decreased safety awareness and delayed processing and decreased standing balance, decreased postural control, hemiplegia and decreased balance strategies.  Prior to hospitalization, patient was min with mobility and lived with Other (Comment) (Has a roommate in her room.) in a [place] home.  Home access is  Ramped entrance.  Patient will benefit from skilled PT intervention to maximize safe functional mobility, minimize fall risk and decrease caregiver burden for planned discharge home with 24 hour supervision.  Anticipate patient will benefit from follow up [place] at discharge.  PT - End of Session Activity Tolerance: Tolerates 10 - 20 min activity with multiple rests Endurance Deficit: Yes PT Assessment Rehab Potential (ACUTE/IP ONLY): Good Barriers to Discharge: Decreased caregiver support PT Patient demonstrates impairments in the following area(s): Balance;Behavior;Endurance;Motor;Pain;Safety;Skin Integrity PT Transfers Functional Problem(s): Bed Mobility;Bed to Chair;Car;Furniture;Floor PT Locomotion Functional Problem(s): Ambulation;Wheelchair Mobility  PT Plan PT Intensity: Minimum of 1-2 x/day ,45 to 90 minutes PT Frequency: 5 out of 7 days PT Duration Estimated Length of Stay: 19-24 days  PT Treatment/Interventions: Ambulation/gait training;Balance/vestibular training;Community reintegration;Discharge  planning;Disease management/prevention;DME/adaptive equipment instruction;Functional mobility training;Neuromuscular re-education;Pain management;Patient/family education;Psychosocial support;Skin care/wound management;Splinting/orthotics;Stair training;Therapeutic Activities;UE/LE Strength taining/ROM;UE/LE Coordination activities;Visual/perceptual remediation/compensation;Therapeutic Exercise;Wheelchair propulsion/positioning PT Transfers Anticipated Outcome(s): Supervsion assist with LRAD  PT Locomotion Anticipated Outcome(s): Supervision assist in [facility] and supervision ambulation for short distances with LRAD PT Recommendation Follow Up Recommendations: Home health PT Patient destination: Home Equipment Recommended: Wheelchair (measurements);Wheelchair cushion (measurements);Rolling walker with 5" wheels;To be determined  Skilled Therapeutic Intervention PT instructed patient in PT Evaluation and initiated treatment intervention; see below for results. PT educated patient in [place], rehab potential, rehab goals, and discharge recommendations.  Patient returned too room and left sitting in [facility] with call bell in reach and all needs met.      PT Evaluation Precautions/Restrictions Restrictions Weight Bearing Restrictions: Yes RLE Weight Bearing: Weight bearing as tolerated General   Vital SignsTherapy Vitals Temp: 97.5 F (36.4 C) Temp Source: Oral Pulse Rate: (!) 121 Resp: 18 BP: 110/73 Patient Position (if appropriate): Sitting Oxygen Therapy SpO2: 97 % O2 Device: Not Delivered Pain Pain Assessment Pain Score: 0-No pain Home Living/Prior Functioning Home Living Available Help at Discharge: Available 24 hours/day Type of Home: Group Home Home Access: Ramped entrance Home Layout: One level Additional Comments: Plans are to return to group home after rehab discharge  Lives With: Other (Comment) (Has a roommate in her room.) Prior Function Comments: inconsistent history from pt.  reports Using Rollator for mobility in group home. Pt reports haveing help with cleaning, meal prep, and eating.  Vision/Perception     Cognition Overall Cognitive Status: History of cognitive impairments - at baseline Arousal/Alertness: Lethargic Orientation Level: Oriented to person;Oriented to place;Oriented to time;Oriented to situation Problem Solving: Impaired Problem Solving Impairment: Functional complex Safety/Judgment: Impaired Sensation Sensation Light Touch: Appears Intact Proprioception: Appears Intact Coordination Gross Motor Movements are Fluid and Coordinated: No Fine Motor Movements are Fluid and Coordinated: No Coordination and Movement Description: limited coordination and RUE and poor ROM 2/2 to pain on the RLE Finger Nose Finger Test: Decreased speed on the LUE, significant diffulty with RUE Motor  Motor Motor: Hemiplegia;Abnormal tone Motor - Skilled Clinical Observations: historty of CP, Hemiplegia   Mobility Bed Mobility Bed Mobility: Rolling Left;Supine to Sit;Sit to Supine Rolling Left: 4: Min assist Rolling Left Details: Verbal cues for precautions/safety;Verbal cues for technique Supine to Sit: 4: Min assist Supine to Sit Details: Verbal cues for precautions/safety;Verbal cues for technique;Tactile cues for placement Sit to Supine: 4: Min assist Sit to Supine - Details: Verbal cues for precautions/safety;Verbal cues for technique;Tactile cues for placement Transfers Transfers: Yes Sit to Stand: 3: Mod assist Stand Pivot Transfers: 2: Max assist;3: Mod assist (with increased time') Stand Pivot Transfer Details: Verbal cues for precautions/safety;Verbal cues for gait pattern;Tactile cues for weight shifting Squat Pivot Transfers: 3: Mod assist (to L. ) Squat Pivot Transfer Details: Verbal cues for precautions/safety;Tactile cues for placement;Tactile cues for weight beaing;Verbal cues for technique Locomotion  Ambulation Ambulation:  Yes Ambulation/Gait Assistance: 2: Max assist Ambulation Distance (Feet): 3 Feet Assistive device: Right platform walker Gait Gait: Yes Gait Pattern: Right steppage;Left flexed knee in stance;Right flexed knee in stance;Trunk flexed;Scissoring Stairs / Additional Locomotion Stairs: No Wheelchair Mobility Wheelchair Mobility: Yes Wheelchair Assistance: 3: Mod assist Wheelchair Assistance Details: Tactile cues for placement;Tactile cues for posture;Verbal cues for safe use  of DME/AE;Verbal cues for technique Wheelchair Propulsion: Left upper extremity;Left lower extremity Wheelchair Parts Management: Needs assistance Distance: 18f   Trunk/Postural Assessment  Cervical Assessment Cervical Assessment: Within Functional Limits Thoracic Assessment Thoracic Assessment: Exceptions to WSelect Specialty Hospital Mt. CarmelLumbar Assessment Lumbar Assessment: Exceptions to WGlenwood Surgical Center LPPostural Control Postural Control: Within Functional Limits  Balance Balance Balance Assessed: Yes Static Sitting Balance Static Sitting - Balance Support: No upper extremity supported Static Sitting - Level of Assistance: 6: Modified independent (Device/Increase time) Dynamic Sitting Balance Dynamic Sitting - Balance Support: No upper extremity supported Dynamic Sitting - Level of Assistance: 5: Stand by assistance Static Standing Balance Static Standing - Balance Support: Bilateral upper extremity supported Static Standing - Level of Assistance: 3: Mod assist Dynamic Standing Balance Dynamic Standing - Balance Support: Bilateral upper extremity supported Dynamic Standing - Level of Assistance: 2: Max assist Extremity Assessment      RLE Assessment RLE Assessment: Exceptions to WBloomington Asc LLC Dba Indiana Specialty Surgery CenterRLE AROM (degrees) RLE Overall AROM Comments: limited Hip ROM 2/2 pain.  RLE Strength RLE Overall Strength Comments: strength at least 3/5 due unable to formally assist due to pain.  LLE Assessment LLE Assessment: Exceptions to WUniv [facility](Strength 4+/5 proximal to  distal with pain in R hip on testing. mild decreased ROM into Abduction/ER.)   See Function Navigator for Current Functional Status.   Refer to Care Plan for Long Term Goals  Recommendations for other services: None   Discharge Criteria: Patient will be discharged from PT if patient refuses treatment 3 consecutive times without medical reason, if treatment goals not met, if there is a change in medical status, if patient makes no progress towards goals or if patient is discharged from hospital.  The above assessment, treatment plan, treatment alternatives and goals were discussed and mutually agreed upon: by patient  ALorie Phenix3/01/2017, 5:07 PM

## 2017-01-22 NOTE — Evaluation (Addendum)
Occupational Therapy Assessment and Plan  Patient Details  Name: Hannah Kennedy MRN: [id number] Date of Birth: 1986/03/17  OT Diagnosis: abnormal posture, cognitive deficits, hemiplegia affecting non-dominant side, muscle weakness (generalized) and pain in joint Rehab Potential: Rehab Potential (ACUTE ONLY): Good ELOS: 3.5 weeks   Today's Date: 01/22/2017 OT Individual Time: 9924-2683 and 0755-0900 OT Individual Time Calculation (min): 64 min   And 65 min  Problem List:  Patient Active Problem List   Diagnosis Date Noted  . Abnormality of gait   . History of DVT (deep vein thrombosis)   . Post-operative pain   . Spastic hemiplegia affecting nondominant side ([place])   . Leukocytosis   . Acute blood loss anemia   . Glaucoma   . Uveitis   . Status post total replacement of right hip   . 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 Cerebral palsy ([place]) 01/04/2017  . Schizophrenia ([place]) 01/04/2017  . Avascular necrosis of bone of hip, right ([place]) 01/04/2017  . Hx of pulmonary embolus 01/04/2017    Past Medical History:  Past Medical History:  Diagnosis Date  . Arthritis   . Cerebral palsy ([place])    with hydrocephalus  . Chronic anterior uveitis   . Depression   . GERD (gastroesophageal reflux disease)   . Glaucoma   . Hemiparesis ([place])    right side  . 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 Pulmonary emboli ([place])   . Schizophrenia ([place])   . Vitamin B 12 deficiency    Past Surgical History:  Past Surgical History:  Procedure Laterality Date  . CSF SHUNT    . IVC FILTER INSERTION    . IVC FILTER INSERTION N/A 01/13/2017   Procedure: IVC Filter Insertion;  Surgeon: Algernon Huxley, MD;  Location: [facility] [place] [facility];  Service: Cardiovascular;  Laterality: N/A;  . TOTAL HIP ARTHROPLASTY Right 01/18/2017   Procedure: TOTAL HIP ARTHROPLASTY ANTERIOR APPROACH;  Surgeon: Hessie Knows, MD;  Location: [facility];  Service: Orthopedics;  Laterality: Right;     Assessment & Plan Clinical Impression: Hannah Kennedy a 31 y.o.female with history of CP with right hemiparesis, Schizophrenia, parkinsonism due to meds, PE/DVT, right hip AVN with increase in pain and decline in mobility. History taken from chart review. Patient/family elected on surgical intervention and temporary IVC filter placed prior to admission. She was admitted to [facility] on 01/18/17 for anterior R-THR by Dr. Rudene Christians. Post op limited by increase in tone as well as pain. Noted to have hypotension post procedure as well as leucocytosis. Therapy ongoing and patient showing improvement in activity tolerance. CIR recommended for follow up therapy.   Patient currently requires max with basic self-care skills secondary to muscle weakness, decreased cardiorespiratoy endurance, abnormal tone, unbalanced muscle activation and decreased coordination, decreased awareness, decreased problem solving and decreased safety awareness and decreased standing balance, hemiplegia and decreased balance strategies.  Prior to hospitalization, patient could complete BADLs with modified independent .  Patient will benefit from skilled intervention to increase independence with basic self-care skills prior to discharge home with care partners at [facility].  Anticipate patient will require 24 hour supervision and follow up of home health OT.   OT - End of Session Endurance Deficit: Yes OT Assessment Rehab Potential (ACUTE ONLY): Good Barriers to Discharge: Decreased caregiver support OT Patient demonstrates impairments in the following area(s): Balance;Safety;Cognition;Endurance;Motor;Pain;Vision OT Basic ADL's Functional Problem(s): Grooming;Bathing;Dressing;Toileting OT Transfers Functional Problem(s): Toilet;Tub/Shower OT Additional Impairment(s): Fuctional Use of Upper  Extremity OT Plan OT Intensity: Minimum of 1-2 x/day, 45 to 90 minutes OT Frequency: 5 out of 7 days OT Duration/Estimated Length of  Stay: 3.5 weeks OT Treatment/Interventions: Discharge planning;Pain management;Self Care/advanced ADL retraining;Therapeutic Activities;UE/LE Coordination activities;Patient/family education;Functional mobility training;Therapeutic Exercise;DME/adaptive equipment instruction;Neuromuscular re-education;Psychosocial support;UE/LE Strength taining/ROM OT Self Feeding Anticipated Outcome(s): N/A OT Basic Self-Care Anticipated Outcome(s): Supervision-Min A OT Toileting Anticipated Outcome(s): Min A  OT Bathroom Transfers Anticipated Outcome(s): Supervision OT Recommendation Recommendations for Other Services: Speech consult;Neuropsych consult Patient destination: Home (group home) Follow Up Recommendations: 24 hour supervision/assistance Equipment Recommended: 3 in 1 bedside comode;To be determined   Skilled Therapeutic Intervention Skilled OT session completed with focus on initial evaluation, education on OT role/POC, precaution adherence, safety, and establishment of patient centered goals. Pt was on bedpan at time of arrival with nursing staff. C/o R hip pain with RN notified and providing medication at start of tx. Pt agreeable to eating breakfast at EOB. Supine<sit completed with Mod A for elevating trunk and assisting with R LE. Pt required set up for breakfast and tasks with bilateral demands, noted significant R UE hypertonicity, especially with intentional movements. Passive stretching completed to elbow/wrist/digits while providing education on CIR and therapy process. Afterwards she completed ADLs at EOB. Pt presents with forward head and kyphotic posture but experienced no LOB. She required overall Max A due to R hip pain, and residual R hemiparesis. Max A sit<stand with PF walker, pt unable to come to complete stand due to CP deformities. Max A for bathing/dressing with cues for safety. Afterwards she completed squat pivot<w/c with Total A to right side. Max multimodal cues on technique  provided. At end of session pt was left in w/c with all needs within reach. Extensive education provided on using call bell for needs with pt verbalizing understanding.   2nd Session 1:1 tx (64 min) Pt was sitting in w/c with grandmother present at time of arrival, agreeable to session. Tx focus on functional toilet transfers, activity tolerance, and family education. Pt practiced squat pivot/lateral scoot/slideboard transfers to and from drop arm commode. Pt required Mod-Total A for transfers, inconsistent and benefits from additional practice. Pts best transfer was lateral scoot (without slideboard). She completed with Mod-Max A and safety plan was updated to reflect this. After transfer practice, she reported needing to void. She was able to complete hygiene mgt while seated but required assist for clothing mgt in standing. Oral care completed w/c level at sink with supervision, pt integrating R UE as gross stabilizer. Discussed with grandmother\pt PLOF, OT goals, and ELOS. At end of session pt was left in w/c with grandmother and all needs within reach. No safety belt donned due to pt c/o of pain near wound vac site. Pt able to reiterate education from this AM regarding using her call bell. RN made aware.   OT Evaluation Precautions/Restrictions  Precautions Precautions: Fall;Anterior Hip;Other (comment) (No hip precautions) Precaution Comments:  (wound vac, right spastic hemiparesis, glaucoma, CP) Restrictions Weight Bearing Restrictions: Yes RLE Weight Bearing: Weight bearing as tolerated General Chart Reviewed: Yes Family/Caregiver Present: Yes Vital Signs Therapy Vitals Temp: 97.5 F (36.4 C) Temp Source: Oral Pulse Rate: (!) 121 Resp: 18 BP: 110/73 Patient Position (if appropriate): Sitting Oxygen Therapy SpO2: 97 % O2 Device: Not Delivered Pain ice pack to R hip at end of tx Pain Assessment Pain Score: 0-No pain Home Living/Prior Functioning Home Living Available Help at  Discharge: Available 24 hours/day Type of Home: Group Home Home Access: Ramped entrance  Home Layout: One level Bathroom Shower/Tub: Engineer, mining: Yes Additional Comments: Plans are to return to group home after rehab discharge  Lives With: Other (Comment) (roommate) IADL History Homemaking Responsibilities: No Current License: No Mode of Transportation: Lucianne Lei (by group home staff members) Occupation: Unemployed Leisure and Hobbies: going on community outings, bowling Prior Function Level of Independence: Independent with basic ADLs, Independent with transfers Comments: inconsistent history from pt. reports Using Rollator for mobility in group home. Pt reports haveing help with cleaning, meal prep, and eating.  ADL ADL ADL Comments: Please see functional navigator for ADL status Vision/Perception  Vision- History Baseline Vision/History: Wears glasses;Glaucoma Wears Glasses: At all times Vision- Assessment Vision Assessment?: Yes Tracking/Visual Pursuits: Decreased smoothness of horizontal tracking Convergence: Impaired (comment) (eyes do not converge when tracking stimulis ) Praxis Praxis-Other Comments: during self care completion  Cognition Overall Cognitive Status: History of cognitive impairments - at baseline Arousal/Alertness: Lethargic Orientation Level: Person;Situation Year: 2018 Month: March Day of Week: Correct Memory: Impaired Memory Impairment: Decreased recall of new information Immediate Memory Recall: Sock;Blue;Bed Memory Recall: Sock;Bed;Blue Memory Recall Sock: Without Cue Memory Recall Blue: With Cue Memory Recall Bed: Without Cue Attention: Sustained Sustained Attention: Appears intact Problem Solving: Impaired Problem Solving Impairment: Functional complex Behaviors: Impulsive;Poor frustration tolerance Safety/Judgment: Impaired Sensation Sensation Light Touch: Appears Intact Stereognosis: Not  tested Hot/Cold: Appears Intact Proprioception: Appears Intact Coordination Gross Motor Movements are Fluid and Coordinated: No Fine Motor Movements are Fluid and Coordinated: No Coordination and Movement Description:  (affected by residual right hemiparesis, CP, pain, and parkinsonism ) Finger Nose Finger Test:  (Dysmetria with L UE, not assessed with R UE due to hypertonicity) Motor  Motor Motor: Hemiplegia;Abnormal tone;Abnormal postural alignment and control Motor - Skilled Clinical Observations: historty of CP, Hemiplegia  Mobility  Bed Mobility Bed Mobility: Rolling Left;Supine to Sit;Sit to Supine Rolling Left: 4: Min assist Rolling Left Details: Verbal cues for precautions/safety;Verbal cues for technique Supine to Sit: 4: Min assist Supine to Sit Details: Verbal cues for precautions/safety;Verbal cues for technique;Tactile cues for placement Sit to Supine: 4: Min assist Sit to Supine - Details: Verbal cues for precautions/safety;Verbal cues for technique;Tactile cues for placement Transfers Transfers: Stand to Sit;Sit to Stand Sit to Stand: 2: Max assist Stand to Sit: 2: Max assist  Trunk/Postural Assessment  Cervical Assessment Cervical Assessment: Exceptions to [facility] (forward head) Thoracic Assessment Thoracic Assessment: Exceptions to [facility] (kyphotic) Lumbar Assessment Lumbar Assessment: Exceptions to [facility] (posterior pelvic tilt) Postural Control Postural Control: Within Functional Limits  Balance Balance Balance Assessed: Yes Static Sitting Balance Static Sitting - Balance Support: No upper extremity supported Static Sitting - Level of Assistance: 6: Modified independent (Device/Increase time) Dynamic Sitting Balance Dynamic Sitting - Balance Support: No upper extremity supported Dynamic Sitting - Level of Assistance: 4: Min assist Static Standing Balance Static Standing - Balance Support: Bilateral upper extremity supported Static Standing - Level of Assistance:  3: Mod assist Dynamic Standing Balance Dynamic Standing - Balance Support: Bilateral upper extremity supported Dynamic Standing - Level of Assistance: 2: Max assist Extremity/Trunk Assessment RUE Assessment RUE Assessment: Exceptions to [facility] (hypertonicity at elbow, wrist, and finger joints; swan neck deformity R middle digit) LUE Assessment LUE Assessment: Within Functional Limits   See Function Navigator for Current Functional Status.   Refer to Care Plan for Long Term Goals  Recommendations for other services: Neuropsych   Discharge Criteria: Patient will be discharged from OT if patient refuses treatment 3 consecutive times without  medical reason, if treatment goals not met, if there is a change in medical status, if patient makes no progress towards goals or if patient is discharged from hospital.  The above assessment, treatment plan, treatment alternatives and goals were discussed and mutually agreed upon: by patient and by family  Skeet Simmer 01/22/2017, 5:33 PM

## 2017-01-22 NOTE — Progress Notes (Signed)
[place] PHYSICAL MEDICINE & REHABILITATION     PROGRESS NOTE  Subjective/Complaints:  Pt seen laying in bed this AM.  She slept well overnight.  She is ready to being therapies.   ROS: Denies CP, SOB, N/V/D.  Objective: Vital Signs: Blood pressure 110/73, pulse (!) 121, temperature 97.5 F (36.4 C), temperature source Oral, resp. rate 18, SpO2 97 %. No results found.  Recent Labs  01/20/17 0414 01/22/17 0522  WBC 18.2* 9.1  HGB 11.4* 11.2*  HCT 33.2* 34.9*  PLT 226 229    Recent Labs  01/20/17 0414 01/22/17 0522  NA 136 138  K 4.2 3.9  CL 102 101  GLUCOSE 143* 112*  BUN 14 11  CREATININE 0.72 0.66  CALCIUM 9.0 9.1   CBG (last 3)  No results for input(s): GLUCAP in the last 72 hours.  Wt Readings from Last 3 Encounters:  01/21/17 61.2 kg (135 lb)  01/14/17 61.2 kg (135 lb)  01/13/17 59 kg (130 lb)    Physical Exam:  BP 110/73 (BP Location: Right Arm)   Pulse (!) 121   Temp 97.5 F (36.4 C) (Oral)   Resp 18   SpO2 97%  Constitutional: She appears well-developed and well-nourished.  HENT: Normocephalic and atraumatic.  Eyes: EOMI. No discharge.  Cardiovascular: Normal rate and regular rhythm. No JVD. Respiratory: Effort normal and breath sounds normal.  GI: Soft. Bowel sounds are normal.  Musculoskeletal: She exhibits edema and tenderness.  BLE externally rotated RLE Genuvalgum Moderate edema and pain with minimal movements R>L LE.  Neurological: She is alert and oriented.  Able to follow basic commands without difficulty.  RLE limited due to pain/anxiety.  Motor: RUE 4/5, mAS: 1+/4 elbow flexors, 3/4 wrist flexors, 1/4 finger flexors LUE: 4/5 proximal to distal LLE: HF, KE 3/5, ADF/PF 4/5  Skin: Skin is warm and dry.  Right hip incision with prevena VAC in place.     Assessment/Plan: 1. Functional deficits secondary to right hip AVN s/p THA in patient with spatic right hemiparesis secondary to CP which require 3+ hours per day of  interdisciplinary therapy in a comprehensive inpatient rehab setting. Physiatrist is providing close team supervision and 24 hour management of active medical problems listed below. Physiatrist and rehab team continue to assess barriers to discharge/monitor patient progress toward functional and medical goals.  Function:  Bathing Bathing position   Position: Sitting EOB  Bathing parts Body parts bathed by patient: Right arm, Chest, Abdomen, Front perineal area, Right upper leg, Left upper leg Body parts bathed by helper: Left arm, Buttocks, Right lower leg, Left lower leg, Back  Bathing assist Assist Level:  (Mod A)      Upper Body Dressing/Undressing Upper body dressing   What is the patient wearing?: Pull over shirt/dress     Pull over shirt/dress - Perfomed by patient: Thread/unthread right sleeve, Thread/unthread left sleeve, Put head through opening, Pull shirt over trunk          Upper body assist Assist Level: Supervision or verbal cues      Lower Body Dressing/Undressing Lower body dressing   What is the patient wearing?: Underwear, Pants, Non-skid slipper socks   Underwear - Performed by helper: Thread/unthread right underwear leg, Thread/unthread left underwear leg, Pull underwear up/down   Pants- Performed by helper: Thread/unthread right pants leg, Thread/unthread left pants leg, Pull pants up/down   Non-skid slipper socks- Performed by helper: Don/doff right sock, Don/doff left sock  Lower body assist Assist for lower body dressing:  (Total A)      Toileting Toileting   Toileting steps completed by patient: Performs perineal hygiene Toileting steps completed by helper: Adjust clothing prior to toileting, Adjust clothing after toileting    Toileting assist     Transfers Chair/bed transfer   Chair/bed transfer method: Squat pivot Chair/bed transfer assist level: Maximal assist (Pt 25 - 49%/lift and lower) Chair/bed transfer  assistive device: Armrests     Locomotion Ambulation     Max distance: 4 ft  Assist level: Maximal assist (Pt 25 - 49%)   Wheelchair   Type: Manual Max wheelchair distance: 25ft  Assist Level: Moderate assistance (Pt 50 - 74%)  Cognition Comprehension Comprehension assist level: Understands basic 90% of the time/cues < 10% of the time  Expression Expression assist level: Expresses basic 75 - 89% of the time/requires cueing 10 - 24% of the time. Needs helper to occlude trach/needs to repeat words.  Social Interaction Social Interaction assist level: Interacts appropriately 90% of the time - Needs monitoring or encouragement for participation or interaction.  Problem Solving Problem solving assist level: Solves basic 75 - 89% of the time/requires cueing 10 - 24% of the time  Memory Memory assist level: Recognizes or recalls 75 - 89% of the time/requires cueing 10 - 24% of the time    Medical Problem List and Plan: 1.  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  Begin CIR 2.  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 Pain Management: Scheduled tramadol qid to decrease use of oxycodone.   Scheduled baclofen at bedtime to help with spasticity. Titrate upwards as tolerated 4. Mood: LCSW to follow for evaluation and support.  5. Neuropsych: This patient is not fully capable of making decisions on her own behalf. 6. Skin/Wound Care: Continue VAC for 7 days (~3/7). Maintain adequate nutritional and hydration status 7. Fluids/Electrolytes/Nutrition: Monitor I/Os 8. Leucocytosis: ?Resolved  Cont to monitor  Urge incontinence at baseline.  UA/UCS neg 9. ABLA:   Hb 11.2 on 3/3  Cont to monitor 10. Glaucoma: Vision stable on current regimen--Timoptic, Xalatan   11. Schizophrenia: Managed on [place] and thorazine daily  12. Uveitis: Will consider resuming  meds 13. Hypoalbuminemia  Supplement initiated 3/3   Cont to monitor   LOS (Days) 1 A FACE TO FACE EVALUATION WAS PERFORMED  Cordarrell Sane [name] 01/22/2017 9:58 PM

## 2017-01-23 ENCOUNTER — Inpatient Hospital Stay (HOSPITAL_COMMUNITY): Payer: Medicaid Other

## 2017-01-23 DIAGNOSIS — R609 Edema, unspecified: Secondary | ICD-10-CM

## 2017-01-23 MED ORDER — LIDOCAINE 5 % EX PTCH
1.0000 | MEDICATED_PATCH | CUTANEOUS | Status: DC
  Administered 2017-01-23 – 2017-02-07 (×14): 1 via TRANSDERMAL
  Filled 2017-01-23 (×16): qty 1

## 2017-01-23 NOTE — Progress Notes (Signed)
Placed lidocaine patch on patient's hip for pain. Patient called 30 minutes later to have patch removed. York SpanielSaid it was hurting her.

## 2017-01-23 NOTE — Progress Notes (Signed)
[place] PHYSICAL MEDICINE & REHABILITATION     PROGRESS NOTE  Subjective/Complaints:  Pt seen laying in bed this AM.  She slept well overnight.  She states she had a good first day in therapies.  She notes right hip pain.   ROS: +Right hip pain .Denies CP, SOB, N/V/D.  Objective: Vital Signs: Blood pressure 110/73, pulse (!) 121, temperature 97.5 F (36.4 C), temperature source Oral, resp. rate 18, SpO2 97 %. No results found.  Recent Labs  01/22/17 0522  WBC 9.1  HGB 11.2*  HCT 34.9*  PLT 229    Recent Labs  01/22/17 0522  NA 138  K 3.9  CL 101  GLUCOSE 112*  BUN 11  CREATININE 0.66  CALCIUM 9.1   CBG (last 3)  No results for input(s): GLUCAP in the last 72 hours.  Wt Readings from Last 3 Encounters:  01/21/17 61.2 kg (135 lb)  01/14/17 61.2 kg (135 lb)  01/13/17 59 kg (130 lb)    Physical Exam:  BP 110/73 (BP Location: Right Arm)   Pulse (!) 121   Temp 97.5 F (36.4 C) (Oral)   Resp 18   SpO2 97%  Constitutional: She appears well-developed and well-nourished.  HENT: Normocephalic and atraumatic.  Eyes: EOMI. No discharge.  Cardiovascular: RRR. No JVD. Respiratory: Effort normal and breath sounds normal.  GI: Soft. Bowel sounds are normal.  Musculoskeletal: She exhibits edema and tenderness.  BLE externally rotated RLE Genuvalgum Moderate edema and pain with minimal movements RLE.  Neurological: She is alert and oriented.  Able to follow basic commands without difficulty.  RLE limited due to pain/anxiety.  Motor: RUE 4/5, mAS: 1+/4 elbow flexors, 3/4 wrist flexors, 1/4 finger flexors LUE: 4/5 proximal to distal LLE: HF, KE 3/5, ADF/PF 4/5 (unchanged) Skin: Skin is warm and dry.  Right hip incision with prevena VAC in place.     Assessment/Plan: 1. Functional deficits secondary to right hip AVN s/p THA in patient with spatic right hemiparesis secondary to CP which require 3+ hours per day of interdisciplinary therapy in a comprehensive  inpatient rehab setting. Physiatrist is providing close team supervision and 24 hour management of active medical problems listed below. Physiatrist and rehab team continue to assess barriers to discharge/monitor patient progress toward functional and medical goals.  Function:  Bathing Bathing position   Position: Sitting EOB  Bathing parts Body parts bathed by patient: Right arm, Chest, Abdomen, Front perineal area, Right upper leg, Left upper leg Body parts bathed by helper: Left arm, Buttocks, Right lower leg, Left lower leg, Back  Bathing assist Assist Level:  (Mod A)      Upper Body Dressing/Undressing Upper body dressing   What is the patient wearing?: Pull over shirt/dress     Pull over shirt/dress - Perfomed by patient: Thread/unthread right sleeve, Thread/unthread left sleeve, Put head through opening, Pull shirt over trunk          Upper body assist Assist Level: Supervision or verbal cues      Lower Body Dressing/Undressing Lower body dressing   What is the patient wearing?: Underwear, Pants, Non-skid slipper socks   Underwear - Performed by helper: Thread/unthread right underwear leg, Thread/unthread left underwear leg, Pull underwear up/down   Pants- Performed by helper: Thread/unthread right pants leg, Thread/unthread left pants leg, Pull pants up/down   Non-skid slipper socks- Performed by helper: Don/doff right sock, Don/doff left sock  Lower body assist Assist for lower body dressing:  (Total A)      Toileting Toileting   Toileting steps completed by patient: Performs perineal hygiene Toileting steps completed by helper: Adjust clothing prior to toileting, Adjust clothing after toileting    Toileting assist     Transfers Chair/bed transfer   Chair/bed transfer method: Squat pivot Chair/bed transfer assist level: Maximal assist (Pt 25 - 49%/lift and lower) Chair/bed transfer assistive device: Armrests      Locomotion Ambulation     Max distance: 4 ft  Assist level: Maximal assist (Pt 25 - 49%)   Wheelchair   Type: Manual Max wheelchair distance: 5150ft  Assist Level: Moderate assistance (Pt 50 - 74%)  Cognition Comprehension Comprehension assist level: Understands basic 90% of the time/cues < 10% of the time  Expression Expression assist level: Expresses basic 75 - 89% of the time/requires cueing 10 - 24% of the time. Needs helper to occlude trach/needs to repeat words.  Social Interaction Social Interaction assist level: Interacts appropriately 90% of the time - Needs monitoring or encouragement for participation or interaction.  Problem Solving Problem solving assist level: Solves basic 75 - 89% of the time/requires cueing 10 - 24% of the time  Memory Memory assist level: Recognizes or recalls 75 - 89% of the time/requires cueing 10 - 24% of the time    Medical Problem List and Plan: 1.  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  Cont CIR 2.  DVT Prophylaxis (with hx of DVT/PE)/Anticoagulation: Pharmaceutical: Lovenox  Vasc study limited, but negative 3. Pain Management: Scheduled tramadol qid to decrease use of oxycodone.   Scheduled baclofen at bedtime to help with spasticity. Titrate upwards as tolerated  Lidoderm started 3/4 4. Mood: LCSW to follow for evaluation and support.  5. Neuropsych: This patient is not fully capable of making decisions on her own behalf. 6. Skin/Wound Care: Continue VAC for 7 days (~3/7). Maintain adequate nutritional and hydration status 7. Fluids/Electrolytes/Nutrition: Monitor I/Os 8. Leucocytosis: ?Resolved  Cont to monitor  Urge incontinence at baseline.  UA/UCS neg 9. ABLA:   Hb 11.2 on 3/3  Cont to monitor 10. Glaucoma: Vision stable on current regimen--Timoptic, Xalatan   11. Schizophrenia: Managed on [place] and thorazine daily  12.  Uveitis: Will consider resuming meds 13. Hypoalbuminemia  Supplement initiated 3/3   Cont to monitor   LOS (Days) 2 A FACE TO FACE EVALUATION WAS PERFORMED  Ankit Karis Jubanil Patel 01/23/2017 2:33 PM

## 2017-01-23 NOTE — Progress Notes (Signed)
*  PRELIMINARY RESULTS* Vascular Ultrasound Lower extremity venous duplex has been completed.  Preliminary findings: Unable to image right femoral vein as patient would not let me move leg to get to medial thigh. No evidence of DVT in visualized veins bilaterally.  Technically difficult study as patient kept yelling for me to stop, then when I would stop she would say I could keep going. While scanning left leg she kept saying that nothing is wrong with left leg. I told her the order was for both legs. She said I was lying and that they only wanted the right leg. I showed her the order which stated bilateral. She seemed to believe me at that point and let me finish scanning left leg.    Farrel DemarkJill Eunice, RDMS, RVT  01/23/2017, 10:01 AM

## 2017-01-24 ENCOUNTER — Inpatient Hospital Stay (HOSPITAL_COMMUNITY): Payer: Medicaid Other | Admitting: Occupational Therapy

## 2017-01-24 ENCOUNTER — Inpatient Hospital Stay (HOSPITAL_COMMUNITY): Payer: Medicaid Other | Admitting: Physical Therapy

## 2017-01-24 DIAGNOSIS — R079 Chest pain, unspecified: Secondary | ICD-10-CM

## 2017-01-24 MED ORDER — METOPROLOL TARTRATE 12.5 MG HALF TABLET
12.5000 mg | ORAL_TABLET | Freq: Two times a day (BID) | ORAL | Status: DC
  Administered 2017-01-24 – 2017-02-08 (×30): 12.5 mg via ORAL
  Filled 2017-01-24 (×31): qty 1

## 2017-01-24 NOTE — Progress Notes (Signed)
Patient called to use the bathroom. Then bed alarm set off. Arrived in room, grandmother getting patient out of bed. Educated grandmother on need to call for staff. Unclear if grandmother fully agreed to comply.Will report to oncoming staff.

## 2017-01-24 NOTE — Progress Notes (Signed)
Social Work Assessment and Plan Social Work Assessment and Plan  Patient Details  Name: Hannah DowseLatoya D Kennedy MRN: [id number] Date of Birth: [birth year]  Today's Date: 01/24/2017  Problem List:  Patient Active Problem List   Diagnosis Date Noted  . Chest pain   . Abnormality of gait   . History of DVT (deep vein thrombosis)   . Post-operative pain   . Spastic hemiplegia affecting nondominant side (HCC)   . Leukocytosis   . Acute blood loss anemia   . Glaucoma   . Uveitis   . Status post total replacement of right hip   . 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 Cerebral palsy (HCC) 01/04/2017  . Schizophrenia (HCC) 01/04/2017  . Avascular necrosis of bone of hip, right (HCC) 01/04/2017  . Hx of pulmonary embolus 01/04/2017   Past Medical History:  Past Medical History:  Diagnosis Date  . Arthritis   . Cerebral palsy (HCC)    with hydrocephalus  . Chronic anterior uveitis   . Depression   . GERD (gastroesophageal reflux disease)   . Glaucoma   . Hemiparesis (HCC)    right side  . 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 Pulmonary emboli (HCC)   . Schizophrenia (HCC)   . Vitamin B 12 deficiency    Past Surgical History:  Past Surgical History:  Procedure Laterality Date  . CSF SHUNT    . IVC FILTER INSERTION    . IVC FILTER INSERTION N/A 01/13/2017   Procedure: IVC Filter Insertion;  Surgeon: Annice NeedyJason S Dew, MD;  Location: [facility] INVASIVE CV LAB;  Service: Cardiovascular;  Laterality: N/A;  . TOTAL HIP ARTHROPLASTY Right 01/18/2017   Procedure: TOTAL HIP ARTHROPLASTY ANTERIOR APPROACH;  Surgeon: Kennedy BuckerMichael Menz, MD;  Location: [facility] ORS;  Service: Orthopedics;  Laterality: Right;   Social History:  reports that she has never smoked. She has never used smokeless tobacco. She reports that she does not drink alcohol or use drugs.  Family / Support Systems Marital Status: Single Patient Roles: Parent, Other (Comment) (grandmother is legal guardian) Other Supports:  Engineer, siteChristine Woods-grandmother/legal guardian Anticipated Caregiver: Group Home Ability/Limitations of Caregiver: Group Home was providing assist prior to admission due to pt's bad hip Caregiver Availability: 24/7 (supervision level) Family Dynamics: Close with grandmother and mom, she was living with grandmother up until 2 years ago. Pt has friends at the group home and likes her roommate. She wants to go back there upon discharge from rehab  Social History Preferred language: English Religion: Baptist Cultural Background: No issues Education: Some school-special classes Read: Yes (limited) Write: Yes (limited) Employment Status: Disabled Fish farm managerLegal Hisotry/Current Legal Issues: No issues Guardian/Conservator: Christine-grandmother is her legal guardian. Pt is not capable of making her own decisions, will look toward grandmother to make any decisions while here.   Abuse/Neglect Physical Abuse: Denies Verbal Abuse: Denies Sexual Abuse: Denies Exploitation of patient/patient's resources: Denies Self-Neglect: Denies  Emotional Status Pt's affect, behavior adn adjustment status: Pt feels like she had a heart attack this morning when she had chest pain. Discussed the EKG and the stickers on her chest she had. She did not like those and wanted them off. She is tired from therapy and is resting. Her grandmother wants her walking by discharge to go back to the group home. Recent Psychosocial Issues: other health issues-CP, hemiparesis on the right, and schizophrenic on meds for. Pyschiatric History: History of schizophrenia-takes medications for and this manage her. She tries to do what the team asks  of her but is till adjusting to the new unit she is on. She seems to be doing ok with all of the new faces and dealing with the pain from her hip replacement. Substance Abuse History: No issues  Patient / Family Perceptions, Expectations & Goals Pt/Family understanding of illness & functional  limitations: Grandmother has a good understanding of [name] surgery and treatment plan. Pt can report her hip was replaced, cause it was bad. Will see the progress pt makse and work on the appropriate discharge plan. Grandmother to be here as much as possible but does work also. Premorbid pt/family roles/activities: Daughter, Information systems manager, roommate, friend, etc Anticipated changes in roles/activities/participation: resume Pt/family expectations/goals: Pt states: " I want to do good here."  Grandmother states: " I hope she can get walking with a walker, the home is not wheelchair accessible."  [employer]: Other (Comment) (resident of [facility]) Premorbid Home Care/DME Agencies: Other (Comment) (used walker the last three months due to bad hip) Transportation available at discharge: Group home and family  Discharge Planning Living Arrangements: Group Home Support Systems: Parent, Other relatives, Friends/neighbors Type of Residence: Group home Insurance Resources: [employer] (specify county) Surveyor, quantity Resources: [employer] Financial Screen Referred: No Living Expenses: Other (Comment) (Group HOme) Money Management: Other (Comment) Animal nutritionist) Home Management: Facility Patient/Family Preliminary Plans: return to [place] home will check to see if wheelchair accessible in case pt will be using a wheelchair. In one are of the chart she was in a wheelchair prior to admission but grandmother reports not wheelchair accessible. Will talk with the director of the group home once team conference Wed to discuss pt's needs at discharge. Social Work Anticipated Follow Up Needs: HH/OP  Clinical Impression Pleasant simple patient who is willing to work hard in therapies and do what is asked of her. She wants to get better so she can return to her group home due to she likes it there. Her grandmother is her guardian  And is very involved in her care. Will talk with director of  the group home and see exactly how much care they can provide pt at discharge. Will await team conference and work on the best discharge plan for pt. See pt daily so she feels supported and adjusts to the new unit.   Lucy Chris 01/24/2017, 1:40 PM

## 2017-01-24 NOTE — Care Management Note (Signed)
Inpatient Rehabilitation Center Individual Statement of Services  Patient Name:  Hannah DowseLatoya D Kennedy  Date:  01/24/2017  Welcome to the Inpatient Rehabilitation Center.  Our goal is to provide you with an individualized program based on your diagnosis and situation, designed to meet your specific needs.  With this comprehensive rehabilitation program, you will be expected to participate in at least 3 hours of rehabilitation therapies Monday-Friday, with modified therapy programming on the weekends.  Your rehabilitation program will include the following services:  Physical Therapy (PT), Occupational Therapy (OT), 24 hour per day rehabilitation nursing, Therapeutic Recreaction (TR), Neuropsychology and Case Management (Social Worker)  Weekly team conferences will be held on Wednesday to discuss your progress.  Your Social Worker will talk with you frequently to get your input and to update you on team discussions.  Team conferences with you and your family in attendance may also be held.  Expected length of stay: 19-24 days Overall anticipated outcome: Supervision with set-up  Depending on your progress and recovery, your program may change. Your Social Worker will coordinate services and will keep you informed of any changes. Your Social Worker's name and contact numbers are listed  below.  The following services may also be recommended but are not provided by the Inpatient Rehabilitation Center:    Home Health Rehabiltiation Services  Outpatient Rehabilitation Services    Arrangements will be made to provide these services after discharge if needed.  Arrangements include referral to agencies that provide these services.  Your insurance has been verified to be:  medicaid Your [facility]  Pertinent information will be shared with your doctor and your insurance company.  Social Worker:  Dossie DerBecky Greyden Besecker, SW [phone] or ([phone]) [phone]  Information discussed with and copy  given to patient by: Lucy Chrisupree, Tylesha Gibeault G, 01/24/2017, 11:24 AM

## 2017-01-24 NOTE — Progress Notes (Addendum)
[place] PHYSICAL MEDICINE & REHABILITATION     PROGRESS NOTE  Subjective/Complaints:  Pt seen sitting up at the edge of the bed working with OT.  She slept well overnight. She complains of right hip pain.  Later noted to have chest pain with therapies.   ROS: +Chest pain.  Denies CP, SOB, N/V/D.  Objective: Vital Signs: Blood pressure 130/74, pulse (!) 111, temperature 98.3 F (36.8 C), temperature source Oral, resp. rate 17, SpO2 95 %. No results found.  Recent Labs  01/22/17 0522  WBC 9.1  HGB 11.2*  HCT 34.9*  PLT 229    Recent Labs  01/22/17 0522  NA 138  K 3.9  CL 101  GLUCOSE 112*  BUN 11  CREATININE 0.66  CALCIUM 9.1   CBG (last 3)  No results for input(s): GLUCAP in the last 72 hours.  Wt Readings from Last 3 Encounters:  01/21/17 61.2 kg (135 lb)  01/14/17 61.2 kg (135 lb)  01/13/17 59 kg (130 lb)    Physical Exam:  BP 130/74 (BP Location: Right Arm)   Pulse (!) 111   Temp 98.3 F (36.8 C) (Oral)   Resp 17   SpO2 95%  Constitutional: She appears well-developed and well-nourished.  HENT: Normocephalic and atraumatic.  Eyes: EOMI. No discharge.  Cardiovascular: RRR. No JVD. Respiratory: Effort normal and breath sounds normal.  GI: Soft. Bowel sounds are normal.  Musculoskeletal: She exhibits edema and tenderness.  BLE externally rotated RLE Genuvalgum Neurological: She is alert and oriented.  Able to follow basic commands without difficulty.  RLE: HF, KE 3/5 Motor: RUE 4/5, mAS: 1+/4 elbow flexors, 3/4 wrist flexors, 1/4 finger flexors LUE: 4/5 proximal to distal LLE: HF, KE 3/5, ADF/PF 4/5  Skin: Skin is warm and dry.  Right hip incision with prevena VAC in place.    Assessment/Plan: 1. 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 Physiatrist is providing close team  supervision and 24 hour management of active medical problems listed below. Physiatrist and rehab team continue to assess barriers to discharge/monitor patient progress toward functional and medical goals.  Function:  Bathing Bathing position   Position: Sitting EOB  Bathing parts Body parts bathed by patient: Right arm, Chest, Abdomen, Front perineal area, Right upper leg, Left upper leg Body parts bathed by helper: Left arm, Buttocks, Right lower leg, Left lower leg, Back  Bathing assist Assist Level:  (Mod A)      Upper Body Dressing/Undressing Upper body dressing   What is the patient wearing?: Pull over shirt/dress     Pull over shirt/dress - Perfomed by patient: Thread/unthread right sleeve, Thread/unthread left sleeve, Put head through opening, Pull shirt over trunk          Upper body assist Assist Level: Supervision or verbal cues      Lower Body Dressing/Undressing Lower body dressing   What is the patient wearing?: Underwear, Pants, Non-skid slipper socks   Underwear - Performed by helper: Thread/unthread right underwear leg, Thread/unthread left underwear leg, Pull underwear up/down   Pants- Performed by helper: Thread/unthread right pants leg, Thread/unthread left pants leg, Pull pants up/down   Non-skid slipper socks- Performed by helper: Don/doff right sock, Don/doff left sock                  Lower body assist Assist for lower  body dressing:  (Total A)      Toileting Toileting   Toileting steps completed by patient: Performs perineal hygiene Toileting steps completed by helper: Adjust clothing prior to toileting, Adjust clothing after toileting Toileting Assistive Devices: Grab bar or rail  Toileting assist Assist level: Two helpers   Transfers Chair/bed transfer   Chair/bed transfer method: Squat pivot Chair/bed transfer assist level: Maximal assist (Pt 25 - 49%/lift and lower) Chair/bed transfer assistive device: Armrests      Locomotion Ambulation     Max distance: 5 ft Assist level: Maximal assist (Pt 25 - 49%)   Wheelchair   Type: Manual Max wheelchair distance: 55ft  Assist Level: Moderate assistance (Pt 50 - 74%)  Cognition Comprehension Comprehension assist level: Understands basic 90% of the time/cues < 10% of the time  Expression Expression assist level: Expresses basic 75 - 89% of the time/requires cueing 10 - 24% of the time. Needs helper to occlude trach/needs to repeat words.  Social Interaction Social Interaction assist level: Interacts appropriately 90% of the time - Needs monitoring or encouragement for participation or interaction.  Problem Solving Problem solving assist level: Solves basic 75 - 89% of the time/requires cueing 10 - 24% of the time  Memory Memory assist level: Recognizes or recalls 75 - 89% of the time/requires cueing 10 - 24% of the time    Medical Problem List and Plan: 1.  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  Cont CIR 2.  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 Pain Management: Scheduled tramadol qid to decrease use of oxycodone.   Scheduled baclofen at bedtime to help with spasticity.  Titrate upwards as tolerated   Lidoderm started 3/4, however pt states she had some chest pain?  Will trial one more time. 4. Mood: LCSW to follow for evaluation and support.  5. Neuropsych: This patient is not fully capable of making decisions on her own behalf. 6. Skin/Wound Care: Continue VAC for 7 days (~3/7). Maintain adequate nutritional and hydration status 7. Fluids/Electrolytes/Nutrition: Monitor I/Os 8. Leukocytosis: ?Resolved  Cont to monitor  Urge incontinence at baseline.  UA/UCS neg 9. ABLA:   Hb 11.2 on 3/3  Cont to monitor 10. Glaucoma: Vision stable on current regimen--Timoptic, Xalatan   11. Schizophrenia: Managed on [place] and thorazine daily  12. Uveitis: Will consider resumingmeds 13. Hypoalbuminemia  Supplement initiated 3/3   Cont to monitor 14. Chest pain  With therapies, ECG ordered   LOS (Days) 3 A FACE TO FACE EVALUATION WAS PERFORMED  Teron Blais [name] 01/24/2017 9:18 AM

## 2017-01-24 NOTE — Progress Notes (Signed)
Occupational Therapy Session Note  Patient Details  Name: Hannah DowseLatoya D Watterson MRN: [id number] Date of Birth: [birth year]  Today's Date: 01/24/2017 OT Individual Time: 1030-1200 OT Individual Time Calculation (min): 90 min    Short Term Goals: Week 1:  OT Short Term Goal 1 (Week 1): Pt will complete sit<stand during bathing with Mod A and LRAD OT Short Term Goal 2 (Week 1): Pt will complete toilet transfer with Mod A and LRAD OT Short Term Goal 3 (Week 1): Pt will complete bathing with Min A with use of AE as needed  OT Short Term Goal 4 (Week 1): Pt will complete LB dressing with Mod A   Skilled Therapeutic Interventions/Progress Updates:    Pt seen this session to facilitate sit to stand and postural strength skills along with transfers.  Pt received in w/c already dressed. She did not want to remove clothing to take a full bath but did want to sit at sink to cleanse bottom more thoroughly. She leaned back in w/c to cleanse front area and then sit to stand with mod A to max A the first time and then only min A to stand on subsequent times. Pt stood at sink with L hand supporting balance with min A as therapist assisted with cleansing backside and clothing management.  Pt taken to gym and with +2 for safety, stood to platform RW with only min A. She took 3 steps but then could not safely rotate RW or herself to sit on mat. Pt stated she thought she would fall so w/c placed under her to sit.  Squat pivot w/c >< mat and later w/c >< BSC over toilet with total A. In bathroom, RN assisted with +2 A.  She can forward wt shift and lift hips with only min A but cannot rotate to pivot hips.  She also completed sit to stand to RW in bathroom with min A.  In gym, worked on upright posture unsupported for over 40 min while engaging in games of animal hunt and dominoes. Pt stated she likes dominoes, but needed 90% cues with finding matches.    At end of session, pt transferred to bed with max A using slide  board with +2 A.  Pt in bed with RN with patient.   Therapy Documentation Precautions:  Precautions Precautions: Fall, Anterior Hip, Other (comment) (No hip precautions) Precaution Comments:  (wound vac, right spastic hemiparesis, glaucoma, CP) Restrictions Weight Bearing Restrictions: No RLE Weight Bearing: Weight bearing as tolerated    Vital Signs: Therapy Vitals Temp: 98.3 F (36.8 C) Temp Source: Oral Pulse Rate: (!) 111 Resp: 17 BP: 130/74 Patient Position (if appropriate): Lying Oxygen Therapy SpO2: 95 % O2 Device: Not Delivered Pain: Pain Assessment Pain Score: Asleep ADL: ADL ADL Comments: Please see functional navigator for ADL status  See Function Navigator for Current Functional Status.   Therapy/Group: Individual Therapy  Donaven Criswell 01/24/2017, 10:05 AM

## 2017-01-24 NOTE — Progress Notes (Signed)
Patient with episode of chest pain with PT. Reports started after eating breakfast and points to place on left chest. Pain reproduced with palpation.  Question musculoskeletal in nature v/s GERD. Will check EKG. Maalox given. To monitor for now.

## 2017-01-24 NOTE — Progress Notes (Signed)
Physical Therapy Session Note  Patient Details  Name: Hannah Kennedy MRN: [id number] Date of Birth: Jun 24, 1986  Today's Date: 01/24/2017 PT Individual Time: 0800-0900  And 1345-1456 PT Individual Time Calculation (min): 60 min and 71 min  Skilled Therapeutic Interventions/Progress Updates:   Treatment 1: Patient sound asleep upon arrival and required increased time for arousal. Patient initially resistant to sitting up in wheelchair to eat breakfast, stating, "They put the tray across me in bed," but then reporting need to void and agreeable to transferring OOB. Patient transferred supine > sit to L using rail with min A for bringing RLE off bed with increased time. Performed lateral scoot transfer from bed to drop arm commode with max A and increased time to L. Patient performed perineal hygiene with setup assist after continent void and total A for clothing management. Patient performed sit > stand from commode using R platform RW with mod A and increased time to pull up brief/pants. Patient ambulated to wheelchair using PFRW approx 5 ft with min-mod A. Patient consumed breakfast meal sitting upright in wheelchair. RN Skeet SimmerHilary and PA-C Pam Love notified of patient c/o chest pain and RN present to pass meds and monitor vitals-see below. Seated BLE therex for strengthening and ROM: hip flexion 2 x 5 each LE and LAQ x 10 each LE. Patient left sitting in wheelchair with all needs within reach.   Treatment 2: Patient in bed with RN present after urinary incontinence. Patient rolled to L with min A using rail for total A hygiene and clothing management. Patient transferred supine to sit EOB to L using rail with close supervision. Seated EOB, performed UB dressing with setup and min A for threading RUE into sleeve and LB dressing with sit to stand using PFRW with min A and total A to pull pants over hips. Performed slide board transfer from bed to wheelchair to L with mod A overall, patient able to perform sit  <> squat in order to place/remove board with total A. Patient performed kinetron from wheelchair level at 80 cm/sec for RLE strengthening and ROM with encouragement and increased time. Patient reporting urinary urgency, returned to room and performed modified stand pivot from wheelchair to and from commode over toilet with mod A, total A for clothing management with sit <> stand from commode and patient performing hygiene with setup assist. Performed hand hygiene at sink from wheelchair level. Patient reporting increased R hip pain when RLE positioned on regular leg rest, offered elevating leg rest for improved sitting tolerance and decreased RLE pain and patient verbalized agreement. After retrieving R elevating leg rest, patient adamant that she needed regular leg rest donned. Patient required prolonged rest breaks following functional mobility tasks due to increased RLE pain. Patient left sitting in wheelchair with all needs in reach.   Therapy Documentation Precautions:  Precautions Precautions: Fall, Anterior Hip, Other (comment) (No hip precautions) Precaution Comments:  (wound vac, right spastic hemiparesis, glaucoma, CP) Restrictions Weight Bearing Restrictions: No RLE Weight Bearing: Weight bearing as tolerated Vital Signs: Temp Source: Oral Pulse Rate: (!) 119 Resp: 17 BP: 130/74 Patient Position (if appropriate): Sitting Oxygen Therapy SpO2: 95 % O2 Device: Not Delivered Pain: Unrated chest pain, RN and PA-C made aware  See Function Navigator for Current Functional Status.   Therapy/Group: Individual Therapy  Izela Altier, Prudencio PairRebecca A 01/24/2017, 9:17 AM

## 2017-01-25 ENCOUNTER — Inpatient Hospital Stay (HOSPITAL_COMMUNITY): Payer: Medicaid Other | Admitting: Occupational Therapy

## 2017-01-25 ENCOUNTER — Inpatient Hospital Stay (HOSPITAL_COMMUNITY): Payer: Medicaid Other | Admitting: Physical Therapy

## 2017-01-25 DIAGNOSIS — R0782 Intercostal pain: Secondary | ICD-10-CM

## 2017-01-25 MED ORDER — BACLOFEN 10 MG PO TABS
10.0000 mg | ORAL_TABLET | Freq: Every day | ORAL | Status: DC
  Administered 2017-01-25 – 2017-02-07 (×14): 10 mg via ORAL
  Filled 2017-01-25 (×14): qty 1

## 2017-01-25 NOTE — Progress Notes (Signed)
Occupational Therapy Session Note  Patient Details  Name: Hannah Kennedy MRN: [id number] Date of Birth: Apr 07, 1986  Today's Date: 01/25/2017  Session 1 OT Individual Time: 0802-0900 OT Individual Time Calculation (min): 58 min   Session 2 OT Individual Time: 1303-1430 OT Individual Time Calculation (min): 87 min    Short Term Goals: Week 1:  OT Short Term Goal 1 (Week 1): Pt will complete sit<stand during bathing with Mod A and LRAD OT Short Term Goal 2 (Week 1): Pt will complete toilet transfer with Mod A and LRAD OT Short Term Goal 3 (Week 1): Pt will complete bathing with Min A with use of AE as needed  OT Short Term Goal 4 (Week 1): Pt will complete LB dressing with Mod A   Skilled Therapeutic Interventions/Progress Updates:  Session 1   1:1 OT session focused on transfers and modified bathing/dressing. Sliding board transfer to L side with Mod A. Stand-pivot>BSC over toilet w/ Mod A stand, Max A to pivot + grab bars.Total A to manage clothing, set-up for peri-care using lateral leans.  Sink level bathing/dressing from w/c. OT educated pt on L:B dressing techniques using long-handled reacher. Min A to manipulate reacher with demonstration to follow technique. Min A sit<>stand, w/ min/mod A to maintain standing while OT pulled pants over hips. Pt unable fully extend hips/trunk to achieve full stand. Pt left seated in wxc at end of session w/ set-up for breakfast.   Session 2 Pt using phone upon OT arrival, asking for her grandma to individual on the other line. When pt got off the phone, OT asked who she called and pt stated "I called 911 because my grandma didn't answer the phone." Discussed with pt appropriate uses of 911 and discussed what kind of situations would qualify as an Emergency to warrant calling 911. Pt agreed that this was not an emergency situation and states she will not call 911 with non-emergencies. RN notified.   1:1 OT session focused on improved sit<>stand,  standing endurance, trunk extension, and sitting/standing balance. Max A sit<>stand in standing frame with focus on lateral and anterior weight shift to R, tactile cues to facilitate full hip extension midline posture. Sit<>stand 4x with extended rest breaks in between ~ 3-4 mins at longest standing bout. Engaged pt in tic-tac-tow game in standing as well as animal sort game. Verbal cues to find matches and block wins. Dynamic reaching activity focused on trunk extension and sitting balance. Pillow placed between knees to reduce hip adDuction and internal rotation when powering through legs to reach to R. Pt returned to room at end of session and left with needs met and call bell in reach.   Therapy Documentation Precautions:  Precautions Precautions: Fall, Anterior Hip, Other (comment) (No hip precautions) Precaution Comments:  (wound vac, right spastic hemiparesis, glaucoma, CP) Restrictions Weight Bearing Restrictions: Yes RLE Weight Bearing: Weight bearing as tolerated Pain: Pain Assessment Pain Assessment: 0-10 Pain Score: 8  Pain Type: Surgical pain Pain Location: Hip Pain Orientation: Right Pain Descriptors / Indicators: Aching Pain Frequency: Intermittent Pain Onset: Unable to tell Patients Stated Pain Goal: 4 Pain Intervention(s): Repositioned ADL: ADL ADL Comments: Please see functional navigator for ADL status  See Function Navigator for Current Functional Status.   Therapy/Group: Individual Therapy  Valma Cava 01/25/2017, 3:50 PM

## 2017-01-25 NOTE — Progress Notes (Signed)
Physical Therapy Session Note  Patient Details  Name: Hannah DowseLatoya D Mckoy MRN: [id number] Date of Birth: 1985/11/26  Today's Date: 01/25/2017 PT Individual Time: 1000-1100 PT Individual Time Calculation (min): 60 min   Skilled Therapeutic Interventions/Progress Updates:   Patient in wheelchair upon arrival. Session focused on sit <> stand transfers using right platform RW with max A, static standing tolerance with supervision-min guard, and attempted progression of ambulation after raising platform on RW to promote improved upright posture but patient able to ambulate 3 ft with shuffling gait before terminating task due to increased R hip pain. Sit <> supine on therapy mat with supervision and increased time. Performed slide board transfers from wheelchair to and from mat table with mod A overall. BLE therex for strengthening and ROM: supine SLR with assist for RLE x 10 total before patient requesting to return to sitting due to pain, seated LAQ, seated hip flexion, and seated isometric hip adduction with pillow squeeze x 10 each LE per exercise. Simulated car transfer to sedan height via slide board transfer with mod A overall with total A to place/stabilize slide board and +2 to stabilize wheelchair for safety. Patient left sitting in wheelchair with all needs in reach.   Therapy Documentation Precautions:  Precautions Precautions: Fall, Anterior Hip, Other (comment) (No hip precautions) Precaution Comments:  (wound vac, right spastic hemiparesis, glaucoma, CP) Restrictions Weight Bearing Restrictions: Yes RLE Weight Bearing: Weight bearing as tolerated Pain: Pain Assessment Pain Assessment: 0-10 Pain Score: 8  Pain Type: Surgical pain Pain Location: Hip Pain Orientation: Right Pain Descriptors / Indicators: Aching Pain Intervention(s): rest, repositioned   See Function Navigator for Current Functional Status.   Therapy/Group: Individual Therapy  Blaiden Werth, Prudencio PairRebecca A 01/25/2017, 12:46 PM

## 2017-01-25 NOTE — Progress Notes (Signed)
Patient information reviewed and entered into [facility] system by Dmarcus Decicco, RN, CRRN, PPS Coordinator.  Information including medical coding and functional independence measure will be reviewed and updated through discharge.    

## 2017-01-25 NOTE — Progress Notes (Signed)
[facility] PHYSICAL MEDICINE & REHABILITATION     PROGRESS NOTE  Subjective/Complaints:  Pt laying in bed this AM.  She slept well overnight. She complains of pain in her RLE.  Denies chest pain.   ROS: Denies CP, SOB, N/V/D.  Objective: Vital Signs: Blood pressure 108/79, pulse 99, temperature 98.1 F (36.7 C), temperature source Oral, resp. rate 18, SpO2 98 %. No results found. No results for input(s): WBC, HGB, HCT, PLT in the last 72 hours. No results for input(s): NA, K, CL, GLUCOSE, BUN, CREATININE, CALCIUM in the last 72 hours.  Invalid input(s): CO CBG (last 3)  No results for input(s): GLUCAP in the last 72 hours.  Wt Readings from Last 3 Encounters:  01/21/17 61.2 kg (135 lb)  01/14/17 61.2 kg (135 lb)  01/13/17 59 kg (130 lb)    Physical Exam:  BP 108/79 (BP Location: Left Arm)   Pulse 99   Temp 98.1 F (36.7 C) (Oral)   Resp 18   SpO2 98%  Constitutional: She appears well-developed and well-nourished.  HENT: Normocephalic and atraumatic.  Eyes: EOMI. No discharge.  Cardiovascular: RRR. No JVD. Respiratory: Effort normal and breath sounds normal.  GI: Soft. Bowel sounds are normal.  Musculoskeletal: She exhibits edema and tenderness.  BLE externally rotated RLE Genuvalgum Neurological: She is alert and oriented.  Able to follow basic commands without difficulty.  RLE: HF, KE 3/5, tight adductors Motor: RUE 4/5, mAS: 1+/4 elbow flexors, 3/4 wrist flexors, 1/4 finger flexors LUE: 4/5 proximal to distal LLE: HF, KE 3/5, ADF/PF 4/5  Skin: Skin is warm and dry.  Right hip incision with prevena VAC in place.    Assessment/Plan: 1. 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 Physiatrist is providing close team supervision and 24 hour management of active medical problems listed  below. Physiatrist and rehab team continue to assess barriers to discharge/monitor patient progress toward functional and medical goals.  Function:  Bathing Bathing position   Position: Sitting EOB  Bathing parts Body parts bathed by patient: Right arm, Chest, Abdomen, Front perineal area, Right upper leg, Left upper leg Body parts bathed by helper: Left arm, Buttocks, Right lower leg, Left lower leg, Back  Bathing assist Assist Level:  (Mod A)      Upper Body Dressing/Undressing Upper body dressing   What is the patient wearing?: Pull over shirt/dress     Pull over shirt/dress - Perfomed by patient: Thread/unthread right sleeve, Thread/unthread left sleeve, Put head through opening, Pull shirt over trunk          Upper body assist Assist Level: Supervision or verbal cues      Lower Body Dressing/Undressing Lower body dressing   What is the patient wearing?: Underwear, Pants, Non-skid slipper socks   Underwear - Performed by helper: Thread/unthread right underwear leg, Thread/unthread left underwear leg, Pull underwear up/down   Pants- Performed by helper: Thread/unthread right pants leg, Thread/unthread left pants leg, Pull pants up/down   Non-skid slipper socks- Performed by helper: Don/doff right sock, Don/doff left sock                  Lower body assist Assist for lower body dressing:  (Total A)      Toileting Toileting   Toileting steps completed by patient: Performs perineal hygiene Toileting steps completed by helper: Adjust clothing prior  to toileting, Adjust clothing after toileting Toileting Assistive Devices: Grab bar or rail  Toileting assist Assist level: Two helpers   Transfers Chair/bed transfer   Chair/bed transfer method: Lateral scoot Chair/bed transfer assist level: Moderate assist (Pt 50 - 74%/lift or lower) Chair/bed transfer assistive device: Armrests, Sliding board     Locomotion Ambulation     Max distance: 5 ft Assist level:  Maximal assist (Pt 25 - 49%)   Wheelchair   Type: Manual Max wheelchair distance: 59ft  Assist Level: Moderate assistance (Pt 50 - 74%)  Cognition Comprehension Comprehension assist level: Understands basic 90% of the time/cues < 10% of the time  Expression Expression assist level: Expresses basic 90% of the time/requires cueing < 10% of the time.  Social Interaction Social Interaction assist level: Interacts appropriately 90% of the time - Needs monitoring or encouragement for participation or interaction.  Problem Solving Problem solving assist level: Solves basic 75 - 89% of the time/requires cueing 10 - 24% of the time  Memory Memory assist level: Recognizes or recalls 75 - 89% of the time/requires cueing 10 - 24% of the time    Medical Problem List and Plan: 1.  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  Cont CIR 2.  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 Pain Management: Scheduled tramadol qid to decrease use of oxycodone.   Scheduled baclofen at bedtime to help with spasticity, increased on 3/6   Lidoderm started 3/4. 4. Mood: LCSW to follow for evaluation and support.  5. Neuropsych: This patient is not fully capable of making decisions on her own behalf. 6. Skin/Wound Care: Continue VAC for 7 days (~3/7). Maintain adequate nutritional and hydration status 7. Fluids/Electrolytes/Nutrition: Monitor I/Os 8. Leukocytosis: ?Resolved  Cont to monitor  Urge incontinence at baseline.  UA/UCS neg 9. ABLA:   Hb 11.2 on 3/3  Cont to monitor 10. Glaucoma: Vision stable on current regimen--Timoptic, Xalatan   11. Schizophrenia: Managed on [place] and thorazine daily  12. Uveitis: Will resume home meds 13. Hypoalbuminemia  Supplement initiated 3/3   Cont to monitor 14. Chest pain, MSK  With therapies, ECG reviewed, no evidence of  infarction   LOS (Days) 4 A FACE TO FACE EVALUATION WAS PERFORMED  Hannah Kennedy Hannah Kennedy 01/25/2017 8:48 AM

## 2017-01-26 ENCOUNTER — Inpatient Hospital Stay (HOSPITAL_COMMUNITY): Payer: Medicaid Other | Admitting: Physical Therapy

## 2017-01-26 ENCOUNTER — Inpatient Hospital Stay (HOSPITAL_COMMUNITY): Payer: Medicaid Other | Admitting: Occupational Therapy

## 2017-01-26 ENCOUNTER — Inpatient Hospital Stay (HOSPITAL_COMMUNITY): Payer: Medicaid Other | Admitting: *Deleted

## 2017-01-26 MED ORDER — TRAMADOL HCL 50 MG PO TABS
50.0000 mg | ORAL_TABLET | Freq: Four times a day (QID) | ORAL | Status: DC | PRN
  Administered 2017-01-28 – 2017-01-29 (×3): 50 mg via ORAL
  Filled 2017-01-26 (×3): qty 1

## 2017-01-26 NOTE — Progress Notes (Addendum)
Physical Therapy Session Note  Patient Details  Name: Hannah Kennedy MRN: [id number] Date of Birth: January 13, 1986  Today's Date: 01/26/2017 PT Individual Time: 0800-0857  Skilled Therapeutic Interventions/Progress Updates:   Patient sound asleep upon arrival, requiring increased time to arouse and lethargic throughout session. Patient demanding to change shirt, toilet, and eat breakfast all at the same time and upset with therapist for waking her up. Patient transferred supine > sit with use of rail and increased time with min A for bringing R hip to EOB. Sitting EOB, patient donned sports bra and shirt with setup assist and increased time. Performed slide board transfer to [facility] with mod A and increased time. Patient performed hygiene with setup and total A for clothing management with sit <> stand [facility]. Patient unable to complete full stand due to B hip/knee flexion and BLE internally rotated and adducted position. Performed hand hygiene from wheelchair level. In parallel bars, patient instructed in sit <> stand x 3 with mirror for visual feedback with focus on upright trunk, forward gaze, and hip and knee extension to neutral. Standing trials limited by B LE pain in weightbearing. Patient left sitting in wheelchair with all needs in reach.   Therapy Documentation Precautions:  Precautions Precautions: Fall, Anterior Hip, Other (comment) (No hip precautions) Precaution Comments:  (wound vac, right spastic hemiparesis, glaucoma, CP) Restrictions Weight Bearing Restrictions: Yes RLE Weight Bearing: Weight bearing as tolerated Pain: Pain Assessment Pain Assessment: Faces Faces Pain Scale: Hurts whole lot Pain Type: Acute pain Pain Location: Hip Pain Orientation: Right Pain Descriptors / Indicators: Aching;Grimacing Pain Onset: With Activity Pain Intervention(s): Repositioned;Rest   See Function Navigator for Current Functional Status.   Therapy/Group: Individual  Therapy  Devonta Blanford, Prudencio PairRebecca A 01/26/2017, 9:00 AM

## 2017-01-26 NOTE — Progress Notes (Signed)
1135 01/26/17 nursing Patient c/o pain on her chest  rt upper side while giving med this am offered pain med but patient refused. Complained again with therapist same site refused pain med then followed up pain after an hour per patient it is gone and will call if she needs it. Patient's minister in room pt is calm at this time.

## 2017-01-26 NOTE — Progress Notes (Signed)
[facility]IC[facility]ILITATION     PROGRESS NOTE  Subjective/Complaints:  Pt seen laying in bed this AM.  She slept well overnight.  She is sleepy, but briefly wakes up and states her left shoulder hurts, but then falls back asleep.  ROS: Denies CP, SOB, N/V/D.  Objective: Vital Signs: Blood pressure 102/67, pulse 91, temperature 97.6 F (36.4 C), temperature source Oral, resp. rate 18, SpO2 99 %. No results found. No results for input(s): WBC, HGB, HCT, PLT in the last 72 hours. No results for input(s): NA, K, CL, GLUCOSE, BUN, CREATININE, CALCIUM in the last 72 hours.  Invalid input(s): CO CBG (last 3)  No results for input(s): GLUCAP in the last 72 hours.  Wt Readings from Last 3 Encounters:  01/21/17 61.2 kg (135 lb)  01/14/17 61.2 kg (135 lb)  01/13/17 59 kg (130 lb)    Physical Exam:  BP 102/67 (BP Location: Left Arm)   Pulse 91   Temp 97.6 F (36.4 C) (Oral)   Resp 18   SpO2 99%  Constitutional: She appears well-developed and well-nourished.  HENT: Normocephalic and atraumatic.  Eyes: EOMI. No discharge.  Cardiovascular: RRR. No JVD. Respiratory: Effort normal and breath sounds normal.  GI: Soft. Bowel sounds are normal.  Musculoskeletal: She exhibits edema and tenderness.  BLE externally rotated RLE Genuvalgum Neurological: She is alert and oriented, but sleepy this AM.  Able to follow basic commands without difficulty.  RLE: HF, KE 3/5, tight adductors Motor: RUE 4/5, mAS: 1+/4 elbow flexors, 3/4 wrist flexors, 1/4 finger flexors (stable) LUE: 4/5 proximal to distal LLE: HF, KE 3/5, ADF/PF 4/5  Skin: Skin is warm and dry.  Right hip incision with prevena VAC in place.    Assessment/Plan: 1. 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 Physiatrist is providing close team  supervision and 24 hour management of active medical problems listed below. Physiatrist and rehab team continue to assess barriers to discharge/monitor patient progress toward functional and medical goals.  Function:  Bathing Bathing position   Position: Wheelchair/chair at sink  Bathing parts Body parts bathed by patient: Right arm, Chest, Abdomen, Front perineal area, Right upper leg, Left upper leg Body parts bathed by helper: Left arm, Buttocks, Right lower leg, Left lower leg, Back  Bathing assist Assist Level:  (Mod A)      Upper Body Dressing/Undressing Upper body dressing   What is the patient wearing?: Pull over shirt/dress     Pull over shirt/dress - Perfomed by patient: Thread/unthread right sleeve, Thread/unthread left sleeve, Put head through opening, Pull shirt over trunk          Upper body assist Assist Level: Supervision or verbal cues      Lower Body Dressing/Undressing Lower body dressing   What is the patient wearing?: Underwear, Pants, Non-skid slipper socks Underwear - Performed by patient: Thread/unthread left underwear leg Underwear - Performed by helper: Thread/unthread right underwear leg, Pull underwear up/down Pants- Performed by patient: Thread/unthread left pants leg Pants- Performed by helper: Thread/unthread right pants leg, Pull pants up/down   Non-skid slipper socks- Performed by helper: Don/doff right sock, Don/doff left sock                  Lower body assist Assist for lower body dressing: Assistive device (<Max A) Assistive Device Comment: Reacher  Toileting Toileting   Toileting steps completed by patient: Performs perineal hygiene Toileting steps completed by helper: Adjust clothing prior to toileting, Adjust clothing after toileting Toileting Assistive Devices: Grab bar or rail  Toileting assist Assist level: Touching or steadying assistance (Pt.75%)   Transfers Chair/bed transfer   Chair/bed transfer method: Lateral  scoot Chair/bed transfer assist level: Moderate assist (Pt 50 - 74%/lift or lower) Chair/bed transfer assistive device: Armrests, Sliding board     Locomotion Ambulation     Max distance: 3 ft Assist level: Touching or steadying assistance (Pt > 75%)   Wheelchair   Type: Manual Max wheelchair distance: 6350ft  Assist Level: Moderate assistance (Pt 50 - 74%)  Cognition Comprehension Comprehension assist level: Understands basic 90% of the time/cues < 10% of the time  Expression Expression assist level: Expresses basic 90% of the time/requires cueing < 10% of the time.  Social Interaction Social Interaction assist level: Interacts appropriately 90% of the time - Needs monitoring or encouragement for participation or interaction.  Problem Solving Problem solving assist level: Solves basic 75 - 89% of the time/requires cueing 10 - 24% of the time  Memory Memory assist level: Recognizes or recalls 75 - 89% of the time/requires cueing 10 - 24% of the time    Medical Problem List and Plan: 1.  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  Cont CIR 2.  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 Pain Management: Scheduled tramadol qid to decrease use of oxycodone.   Scheduled baclofen at bedtime to help with spasticity, increased on 3/6   Lidoderm started 3/4. 4. Mood: LCSW to follow for evaluation and support.  5. Neuropsych: This patient is not fully capable of making decisions on her own behalf. 6. Skin/Wound Care: Continue VAC for 7 days, will d/c tomorrow. Maintain adequate nutritional and hydration status 7. Fluids/Electrolytes/Nutrition: Monitor I/Os 8. Leukocytosis: ?Resolved  Cont to monitor  Urge incontinence at baseline.  UA/UCS neg 9. ABLA:   Hb 11.2 on 3/3  Cont to monitor 10. Glaucoma: Vision stable on current regimen--Timoptic, Xalatan   11.  Schizophrenia: Managed on [place] and thorazine daily  12. Uveitis: Home meds resumed 13. Hypoalbuminemia  Supplement initiated 3/3   Cont to monitor 14. Chest pain, MSK  With therapies, ECG reviewed, no evidence of infarction  Appears to have resolved   LOS (Days) 5 A FACE TO FACE EVALUATION WAS PERFORMED  Jerrell Mangel Karis Jubanil Kao [place]ry 01/26/2017 10:57 AM

## 2017-01-26 NOTE — Progress Notes (Signed)
Social Work Patient ID: Hannah Kennedy, female   DOB: October 15, 1986, 31 y.o.   MRN: [id number]  Met with pt and grandmother who was here visiting to inform team conference goals of supervision-min assist level and target discharge  3/20. Have also spoke with [name]-owner of the group home regarding pt's levels and the care she will require at discharge. He reports they were using a rollator due to small enough to fit into the bathroom. Then staff helps her in there. Pt and grandmother really want pt to go back there due to she likes it and they provide good care to here. Will touch base with him next week and work on pt's discharge needs. Pt reports she will try to push through the pain in Therapies.

## 2017-01-26 NOTE — Progress Notes (Signed)
Occupational Therapy Session Note  Patient Details  Name: Hannah Kennedy MRN: [id number] Date of Birth: [birth year]  Today's Date: 01/26/2017 OT Individual Time: 1016-1100 OT Individual Time Calculation (min): 44 min    Short Term Goals: Week 1:  OT Short Term Goal 1 (Week 1): Pt will complete sit<stand during bathing with Mod A and LRAD OT Short Term Goal 2 (Week 1): Pt will complete toilet transfer with Mod A and LRAD OT Short Term Goal 3 (Week 1): Pt will complete bathing with Min A with use of AE as needed  OT Short Term Goal 4 (Week 1): Pt will complete LB dressing with Mod A   Skilled Therapeutic Interventions/Progress Updates:    OT session focused on transfer training, modified grooming tasks, sequencing, and attention. Pt lethargic this morning requiring constant cues to maintain arousal to participate in self-care tasks. Pt able to locate toothbrush and reach to pick it up with her L hand, then places toothbrush in R hand to apply toothpaste. Pt became frustrated because toothpaste was not coming out 2/2 tube being almost empty. Pt refused to try adapted strategy recommended by OT and demanded OT assist with applying toothpaste.  Pt also refuses to turn on/off water with grooming tasks today, despite encouragement. Pt complaining of "chest pain" but is inconsistent with pain descriptors and onset. Attempted to take pt out of room in effort to increase arousal level with environmental changes. Pt continued to require max multimodal cues to keep her eyes open. Returned pt to room, sllide-board transfer with Mod A to L to returned to bed. Bed alarm set and needs met.  Therapy Documentation Precautions:  Precautions Precautions: Fall, Anterior Hip, Other (comment) (No hip precautions) Precaution Comments:  (wound vac, right spastic hemiparesis, glaucoma, CP) Restrictions Weight Bearing Restrictions: Yes RLE Weight Bearing: Weight bearing as tolerated Pain: Pain Assessment Pain  Assessment: Faces Faces Pain Scale: Hurts whole lot Pain Type: Acute pain Pain Location: Hip Pain Orientation: Right Pain Descriptors / Indicators: Aching;Grimacing Pain Onset: With Activity Pain Intervention(s): Repositioned;Rest ADL: ADL ADL Comments: Please see functional navigator for ADL status     See Function Navigator for Current Functional Status.   Therapy/Group: Individual Therapy  Valma Cava 01/26/2017, 10:28 AM

## 2017-01-26 NOTE — Patient Care Conference (Signed)
Inpatient RehabilitationTeam Conference and Plan of Care Update Date: 01/26/2017   Time: 4:11 PM    Patient Name: Hannah Kennedy      Medical Record Number: [id number]  Date of Birth: 1986-10-15 Sex: Female         Room/Bed: 4W25C/4W25C-01 Payor Info: Payor: MEDICAID [place] / Plan: MEDICAID OF [place] / Product Type: *No Product type* /    Admitting Diagnosis: R ThR CP, Parkinsonism  Admit Date/Time:  01/21/2017  1:07 PM Admission Comments: No comment available   Primary Diagnosis:  <principal problem not specified> Principal Problem: <principal problem not specified>  Patient Active Problem List   Diagnosis Date Noted  . Chest pain   . Abnormality of gait   . History of DVT (deep vein thrombosis)   . Post-operative pain   . Spastic hemiplegia affecting nondominant side (HCC)   . Leukocytosis   . Acute blood loss anemia   . Glaucoma   . Uveitis   . Status post total replacement of right hip   . 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 Cerebral palsy (HCC) 01/04/2017  . Schizophrenia (HCC) 01/04/2017  . Avascular necrosis of bone of hip, right (HCC) 01/04/2017  . Hx of pulmonary embolus 01/04/2017    Expected Discharge Date: Expected Discharge Date: 02/08/17  Team Members Present: Physician leading conference: Dr. Maryla MorrowAnkit Patel Social Worker Present: Dossie DerBecky Sam Overbeck, LCSW Nurse Present: Carlean PurlMaryann Barbour, RN PT Present: Bayard Huggerebecca Varner, PT OT Present: Kearney HardElisabeth Doe, OT PPS Coordinator present : Tora DuckMarie Noel, RN, CRRN     Current Status/Progress Goal Weekly Team Focus  Medical   Gait abnormality, limitations in self-care secondary to right hip AVN s/p THA in patient with spatic right hemiparesis secondary to CP  Improve mobility, safety, wound, transfers, pain  See above   Bowel/Bladder   Continent B/B. LBM 3/5. Bladder urgency.  Maintain B/B continence.  Offer frequent toileting WA and PRN.   Swallow/Nutrition/ Hydration             ADL's   mod/max A lateral  scoot/slideboard transfers, Max A LB ADL, Min A UB ADL         Mobility   max A sit <> stand, mod-max A lateral scoot transfers with/without SB, gait up to 5 ft, S-min A bed mobility  Supervision-min A  functional mobility training, RLE strengthening/ROM, pain management, postural control, standing balance, activity tolerance, pt education   Communication             Safety/Cognition/ Behavioral Observations            Pain   Pain to right hip. Scheduled tramadol and baclofen. Roxicodone and robaxin PRN.  Maintain pain to comfortable level of <4/10.  Assess pain Q shift and PRN. Pre-medicate before therapy and activities. Reassess for effectiveness.   Skin   Surgical incision to right anterior hip with wound vac.  Promote wound healing with no new skin breakdown or infections.  Assess surgical site Q shift and PRN. Monitor for healing.      *See Care Plan and progress notes for long and short-term goals.  Barriers to Discharge: Mobility, safety, wound, transfers, pain    Possible Resolutions to Barriers:  Therapies, d/c wound vac tomorrow, optimize pain meds    Discharge Planning/Teaching Needs:  Return to Group Home was using rollator to push herself around prior to admission. Unsure if wheelchair accessible, pt needs to be walking at discharge per grandmother      Team Discussion:  Goals supervision-min assist level, pain limits her due to limited understanding will have pain with hip surgery. RN premedicating prior to therapies. Wound vac on until tomorrow-per MD. Working on standing balance and WB on surgical hip. Plans to go back to group home.  Revisions to Treatment Plan:  DC 3/20   Continued Need for Acute Rehabilitation Level of Care: The patient requires daily medical management by a physician with specialized training in physical medicine and rehabilitation for the following conditions: Daily direction of a multidisciplinary physical rehabilitation program to ensure safe  treatment while eliciting the highest outcome that is of practical value to the patient.: Yes Daily medical management of patient stability for increased activity during participation in an intensive rehabilitation regime.: Yes Daily analysis of laboratory values and/or radiology reports with any subsequent need for medication adjustment of medical intervention for : Post surgical problems;Neurological problems;Wound care problems;Other  Tamanna Whitson, Lemar Livings 01/26/2017, 4:11 PM

## 2017-01-26 NOTE — Progress Notes (Signed)
Occupational Therapy Session Note  Patient Details  Name: Hannah Kennedy MRN: [id number] Date of Birth: 06-02-1986  Today's Date: 01/26/2017 OT Individual Time: 1300-1345 OT Individual Time Calculation (min): 45 min    Short Term Goals: Week 1:  OT Short Term Goal 1 (Week 1): Pt will complete sit<stand during bathing with Mod A and LRAD OT Short Term Goal 2 (Week 1): Pt will complete toilet transfer with Mod A and LRAD OT Short Term Goal 3 (Week 1): Pt will complete bathing with Min A with use of AE as needed  OT Short Term Goal 4 (Week 1): Pt will complete LB dressing with Mod A   Skilled Therapeutic Interventions/Progress Updates: 1:1 Pt in bed asleep when arrived and had not eaten lunch. Focus on bed mobility with min A with more than extra time with bed rails (with HOB elevated) to come to EOB. Transfer stand pivot bed to w/c on her right with min A with extra time. Then transferred to and from the toilet for toileting with min A with grab bar only to the toilet; both with min A. Sit to stand from toilet for clothing management with total A. Washed hands at sink with setup of soap. Pt engaged in eating lunch with discussion about d/c planning and goals but then didn't want to continue until grandmother arrived.    During transfers, pt can come into modified standing position with steadying A but if her feet are positioned to take more weight through her right LE than she feels comfortable, she sat and repositioned and then perform stand pivot with min A with extra time. Left in w/c with phone in hand.      Therapy Documentation Precautions:  Precautions Precautions: Fall, Anterior Hip, Other (comment) (No hip precautions) Precaution Comments:  (wound vac, right spastic hemiparesis, glaucoma, CP) Restrictions Weight Bearing Restrictions: Yes RLE Weight Bearing: Weight bearing as tolerated General: General OT Amount of Missed Time: 30 Minutes due to fatigue and not wanting to do  therapy until grandmother gets here Pain:  c/o with weight bearing through right LE in some transfers-relief with rest See Function Navigator for Current Functional Status.   Therapy/Group: Individual Therapy  Roney MansSmith, Jarrod Mcenery [facility] 01/26/2017, 3:21 PM

## 2017-01-27 ENCOUNTER — Inpatient Hospital Stay (HOSPITAL_COMMUNITY): Payer: Medicaid Other | Admitting: Occupational Therapy

## 2017-01-27 ENCOUNTER — Inpatient Hospital Stay (HOSPITAL_COMMUNITY): Payer: Medicaid Other | Admitting: Physical Therapy

## 2017-01-27 NOTE — Progress Notes (Signed)
Occupational Therapy Session Note  Patient Details  Name: Hannah Kennedy MRN: [id number] Date of Birth: Feb 02, 1986  Today's Date: 01/27/2017 OT Individual Time: 4739-5844 OT Individual Time Calculation (min): 69 min    Short Term Goals: Week 1:  OT Short Term Goal 1 (Week 1): Pt will complete sit<stand during bathing with Mod A and LRAD OT Short Term Goal 2 (Week 1): Pt will complete toilet transfer with Mod A and LRAD OT Short Term Goal 3 (Week 1): Pt will complete bathing with Min A with use of AE as needed  OT Short Term Goal 4 (Week 1): Pt will complete LB dressing with Mod A   Skilled Therapeutic Interventions/Progress Updates:    1:1 OT session focused on postural strength, R hip ROM, improved sit<>stand, weight shifting, pain management, and activity tolerance. Pt finishing lunch upon OT arrival maintain grasp on cup w/ R hand while scooping ice cream w/ L. Pt required encouragement go to the gym for continued therapy. Slide-board transfer to R with set-up, Min A, in increased time to initiate transfer. R hip there-ex in supine. Hip flex/ext add/abd exercises with manual assist to facilitate decreased hip flexion. Pt unable to tolerate hip flexion less than ~50 degrees in supine. Sit<>stand x 12 with min/mod A overall- focus on hip extension, hip abduction, lateral weight shift onto R leg and trunk elongation. Pt required extended rest breaks between each sit<>stand and encouragement to open eyes and initiate each stand. Pt refused further activity and was returned to room with needs met.   Therapy Documentation Precautions:  Precautions Precautions: Fall, Anterior Hip, Other (comment) (No hip precautions) Precaution Comments:  (wound vac, right spastic hemiparesis, glaucoma, CP) Restrictions Weight Bearing Restrictions: Yes RLE Weight Bearing: Weight bearing as tolerated' \\Pain'$ : Pain Assessment Pain Assessment: 0-10 Pain Score: 6  Pain Type: Surgical pain Pain Location:  Hip Pain Orientation: Right Pain Descriptors / Indicators: Aching Pain Onset: With Activity Pain Intervention(s): Repositioned ADL: ADL ADL Comments: Please see functional navigator for ADL status  See Function Navigator for Current Functional Status.   Therapy/Group: Individual Therapy  Valma Cava 01/27/2017, 4:40 PM

## 2017-01-27 NOTE — Progress Notes (Signed)
[facility] PHYSICAL MEDICINE & REHABILITATION     PROGRESS NOTE  Subjective/Complaints:  Pt seen laying in bed this AM. She slept well overnight.  She states she has pain in her RLE, but it is improved with meds.    ROS: Denies CP, SOB, N/V/D.  Objective: Vital Signs: Blood pressure 115/69, pulse 72, temperature 98.2 F (36.8 C), temperature source Oral, resp. rate 18, SpO2 98 %. No results found. No results for input(s): WBC, HGB, HCT, PLT in the last 72 hours. No results for input(s): NA, K, CL, GLUCOSE, BUN, CREATININE, CALCIUM in the last 72 hours.  Invalid input(s): CO CBG (last 3)  No results for input(s): GLUCAP in the last 72 hours.  Wt Readings from Last 3 Encounters:  01/21/17 61.2 kg (135 lb)  01/14/17 61.2 kg (135 lb)  01/13/17 59 kg (130 lb)    Physical Exam:  BP 115/69 (BP Location: Left Arm)   Pulse 72   Temp 98.2 F (36.8 C) (Oral)   Resp 18   SpO2 98%  Constitutional: She appears well-developed and well-nourished.  HENT: Normocephalic and atraumatic.  Eyes: EOMI. No discharge.  Cardiovascular: RRR. No JVD. Respiratory: Effort normal and breath sounds normal.  GI: Soft. Bowel sounds are normal.  Musculoskeletal: She exhibits edema and tenderness.  BLE externally rotated RLE Genuvalgum Neurological: She is alert and oriented Able to follow basic commands without difficulty.  RLE: HF 3-/5, KE 3+/5, 0/5 ADF/PF. Tight adductors Motor: RUE 4/5, mAS: 1+/4 elbow flexors, 3/4 wrist flexors, 1/4 finger flexors (stable) LUE: 4/5 proximal to distal LLE: HF, KE 3/5, ADF/PF 4/5  Skin: Skin is warm and dry.  Right hip incision with prevena VAC in place.    Assessment/Plan: 1. 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 Physiatrist is providing close team supervision and 24 hour management of  active medical problems listed below. Physiatrist and rehab team continue to assess barriers to discharge/monitor patient progress toward functional and medical goals.  Function:  Bathing Bathing position   Position: Wheelchair/chair at sink  Bathing parts Body parts bathed by patient: Right arm, Chest, Abdomen, Front perineal area, Right upper leg, Left upper leg Body parts bathed by helper: Left arm, Buttocks, Right lower leg, Left lower leg, Back  Bathing assist Assist Level:  (Mod A)      Upper Body Dressing/Undressing Upper body dressing   What is the patient wearing?: Pull over shirt/dress     Pull over shirt/dress - Perfomed by patient: Thread/unthread right sleeve, Thread/unthread left sleeve, Put head through opening, Pull shirt over trunk          Upper body assist Assist Level: Supervision or verbal cues      Lower Body Dressing/Undressing Lower body dressing   What is the patient wearing?: Underwear, Pants, Non-skid slipper socks Underwear - Performed by patient: Thread/unthread left underwear leg Underwear - Performed by helper: Thread/unthread right underwear leg, Pull underwear up/down Pants- Performed by patient: Thread/unthread left pants leg Pants- Performed by helper: Thread/unthread right pants leg, Pull pants up/down   Non-skid slipper socks- Performed by helper: Don/doff right sock, Don/doff left sock                  Lower body assist Assist for lower body dressing: Assistive device (<Max A) Assistive Device Comment: Chief of Staffeacher    Toileting Toileting  Toileting steps completed by patient: Performs perineal hygiene Toileting steps completed by helper: Adjust clothing prior to toileting, Adjust clothing after toileting Toileting Assistive Devices: Grab bar or rail  Toileting assist Assist level: Touching or steadying assistance (Pt.75%)   Transfers Chair/bed transfer   Chair/bed transfer method: Lateral scoot Chair/bed transfer assist level:  Moderate assist (Pt 50 - 74%/lift or lower) Chair/bed transfer assistive device: Armrests, Sliding board     Locomotion Ambulation     Max distance: 3 ft Assist level: Touching or steadying assistance (Pt > 75%)   Wheelchair   Type: Manual Max wheelchair distance: 53ft  Assist Level: Moderate assistance (Pt 50 - 74%)  Cognition Comprehension Comprehension assist level: Understands basic 90% of the time/cues < 10% of the time  Expression Expression assist level: Expresses basic 90% of the time/requires cueing < 10% of the time.  Social Interaction Social Interaction assist level: Interacts appropriately 90% of the time - Needs monitoring or encouragement for participation or interaction.  Problem Solving Problem solving assist level: Solves basic 75 - 89% of the time/requires cueing 10 - 24% of the time  Memory Memory assist level: Recognizes or recalls 75 - 89% of the time/requires cueing 10 - 24% of the time    Medical Problem List and Plan: 1.  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  Cont CIR 2.  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 Pain Management: Scheduled tramadol qid to decrease use of oxycodone.   Scheduled baclofen at bedtime to help with spasticity, increased on 3/6   Lidoderm started 3/4.  Improved 4. Mood: LCSW to follow for evaluation and support.  5. Neuropsych: This patient is not fully capable of making decisions on her own behalf. 6. Skin/Wound Care: Continue VAC for 7 days, will d/c today. Maintain adequate nutritional and hydration status 7. Fluids/Electrolytes/Nutrition: Monitor I/Os 8. Leukocytosis: ?Resolved  Cont to monitor  Urge incontinence at baseline.  UA/UCS neg 9. ABLA:   Hb 11.2 on 3/3  Cont to monitor 10. Glaucoma: Vision stable on current regimen--Timoptic, Xalatan   11. Schizophrenia: Managed on [place] and thorazine daily  12. Uveitis: Home meds resumed 13. Hypoalbuminemia  Supplement initiated 3/3   Cont to monitor 14. Chest pain, MSK  With therapies, ECG reviewed, no evidence of infarction  Appears to have resolved   LOS (Days) 6 A FACE TO FACE EVALUATION WAS PERFORMED  Ankit [name] 01/27/2017 8:57 AM

## 2017-01-27 NOTE — Progress Notes (Addendum)
Physical Therapy Session Note  Patient Details  Name: Hannah DowseLatoya D Rosenberry MRN: [id number] Date of Birth: 02/26/1986  Today's Date: 01/27/2017 PT Individual Time: 0800-0900 and 1445-1545 PT Individual Time Calculation (min): 60 min and 60 min  Skilled Therapeutic Interventions/Progress Updates:   Treatment 1: Patient received on drop arm BSC over toilet, handoff from nursing. Performed LB dressing with sit <> [facility] with total A for clothing management. Patient unable to remove LUE support to assist with clothing management due to decreased tolerance to RLE WB. Performed squat pivot/lateral scoot transfer back to wheelchair with max A overall. Performed hand hygiene and washed face at sink from wheelchair level. Patient required max cues to keep eyes open and unable to direct or answer questions regarding wheelchair setup. Performed slide board transfer from wheelchair to level mat to L and back to wheelchair to R with total A to place board and min A overall with increased effort and time to complete transfer. Patient keeping RLE mostly off ground during transfer. Patient sat unsupported on EOM to consume breakfast meal due to c/o stomach hurting and being hungry. Patient became upset with PT after requesting to eat ice cream after therapy and required 5 min to complete transfer back to wheelchair with max encouragement. Patient resistant to further therapy but encouraged by another patient in gym and eventually performed sit <> stand x 6 in parallel bars with supervision and focus on upright posture. Patient left sitting in wheelchair with all needs within reach.   Treatment 2: Patient in wheelchair talking on phone on arrival. Donned shoes with total A however patient adamant that wrong shoe was on L foot despite correct shoe being on foot, placed R shoe on L foot which patient then reported was wrong. Instructed in wheelchair propulsion via L hemi technique with max multimodal cues faded  to min cues to utilize LLE for steering to avoid obstacles on R side with compensatory trunk flexion/extension with L hand propulsion and more than reasonable time, initially requiring mod A faded to supervision. Performed slide board transfer from wheelchair to and from low compliant couch surface with total A to place/remove board, min A downhill to couch and mod A uphill back to wheelchair with increased time. Patient reporting need to toilet and returned to room in wheelchair. [facility] with min A overall, total A for clothing management with patient performing partial stand and setup for hygiene. Sit <> stand from wheelchair with patient pulling up on stair rail despite cues to push from arm rest with min A. Patient negotiated up/down 1 + 3 (3") steps using 2 rails, ascending forward and descending backward with verbal cues for sequencing step-to pattern with mod-max A overall with encouragement from staff and another patient for motivation. Patient left sitting in wheelchair with all needs within reach.   Therapy Documentation Precautions:  Precautions Precautions: Fall, Anterior Hip, Other (comment) (No hip precautions) Precaution Comments:  (wound vac, right spastic hemiparesis, glaucoma, CP) Restrictions Weight Bearing Restrictions: Yes RLE Weight Bearing: Weight bearing as tolerated Pain: Unrated R hip pain with activity, provided rest and repositioned  See Function Navigator for Current Functional Status.   Therapy/Group: Individual Therapy  Shandee Jergens, Prudencio PairRebecca A 01/27/2017, 9:12 AM

## 2017-01-28 ENCOUNTER — Inpatient Hospital Stay (HOSPITAL_COMMUNITY): Payer: Medicaid Other | Admitting: Physical Therapy

## 2017-01-28 ENCOUNTER — Inpatient Hospital Stay (HOSPITAL_COMMUNITY): Payer: Medicaid Other | Admitting: Occupational Therapy

## 2017-01-28 LAB — BASIC METABOLIC PANEL
Anion gap: 8 (ref 5–15)
BUN: 10 mg/dL (ref 6–20)
CALCIUM: 9.2 mg/dL (ref 8.9–10.3)
CO2: 25 mmol/L (ref 22–32)
CREATININE: 0.65 mg/dL (ref 0.44–1.00)
Chloride: 105 mmol/L (ref 101–111)
GFR calc non Af Amer: >60 mL/min (ref >60)
Glucose, Bld: 89 mg/dL (ref 65–99)
Potassium: 3.9 mmol/L (ref 3.5–5.1)
SODIUM: 138 mmol/L (ref 135–145)

## 2017-01-28 LAB — CBC
HCT: 34.5 % — ABNORMAL LOW (ref 36.0–46.0)
Hemoglobin: 10.9 g/dL — ABNORMAL LOW (ref 12.0–15.0)
MCH: 27.9 pg (ref 26.0–34.0)
MCHC: 31.6 g/dL (ref 30.0–36.0)
MCV: 88.2 fL (ref 78.0–100.0)
PLATELETS: 335 10*3/uL (ref 150–400)
RBC: 3.91 MIL/uL (ref 3.87–5.11)
RDW: 14.2 % (ref 11.5–15.5)
WBC: 11.6 10*3/uL — AB (ref 4.0–10.5)

## 2017-01-28 NOTE — Progress Notes (Signed)
Physical Therapy Session Note  Patient Details  Name: Hannah Kennedy MRN: [id number] Date of Birth: 1986/03/15  Today's Date: 01/28/2017 PT Individual Time: 0805-0930 PT Individual Time Calculation (min): 85 min    Skilled Therapeutic Interventions/Progress Updates:   Patient in recliner after RN passed medications finishing breakfast. Patient donned brief with total A and able to thread LLE through pants with setup, total A for threading RLE due to pain and pulling pants over hips with partial stand from recliner with LUE support x 2 with supervision. Total A to don shoes. Patient required more than reasonable time to perform partial stand with S to place slide board with total A and steady assist for slide board transfer from recliner to wheelchair with prolonged rest break in middle of transfer despite cues to complete transfer for safety. In hallway, patient propelled wheelchair using L hemi technique with greatly increased time and compensatory trunk flexion/extension with LUE propulsion and max verbal cues for sequencing use of R foot for steering including one turn to R with supervision overall and multiple rest breaks. Performed kinetron from wheelchair level at 90 cm/sec with max verbal cues for attention to task and sequencing. Sit <> stand in parallel bars with supervision x 5, max verbal/tactile cues for upright posture and increased time to initiate each sit > stand with max encouragement. Patient left sitting in wheelchair with all needs within reach.     Therapy Documentation Precautions:  Precautions Precautions: Fall, Anterior Hip, Other (comment) (No hip precautions) Precaution Comments:  (wound vac, right spastic hemiparesis, glaucoma, CP) Restrictions Weight Bearing Restrictions: Yes RLE Weight Bearing: Partial weight bearing Pain: Pain Assessment Pain Assessment: Faces Faces Pain Scale: Hurts whole lot Pain Type: Acute pain Pain Location: Hip Pain Orientation:  Right Pain Descriptors / Indicators: Aching Pain Onset: With Activity Pain Intervention(s): Rest;Repositioned (premedicated)   See Function Navigator for Current Functional Status.   Therapy/Group: Individual Therapy  Mitchel Delduca, Prudencio PairRebecca A 01/28/2017, 9:38 AM
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
## 2017-01-28 NOTE — Progress Notes (Signed)
Occupational Therapy Session Note  Patient Details  Name: Hannah Kennedy MRN: [id number] Date of Birth: 04-25-1986  Today's Date: 01/28/2017  Session 1 OT Individual Time: 1000-1104 OT Individual Time Calculation (min): 23 m  Session 2 OT Individual Time: 1347-1430 OT Individual Time Calculation (min): 43 min    Short Term Goals: Week 1:  OT Short Term Goal 1 (Week 1): Pt will complete sit<stand during bathing with Mod A and LRAD OT Short Term Goal 2 (Week 1): Pt will complete toilet transfer with Mod A and LRAD OT Short Term Goal 3 (Week 1): Pt will complete bathing with Min A with use of AE as needed  OT Short Term Goal 4 (Week 1): Pt will complete LB dressing with Mod A   Skilled Therapeutic Interventions/Progress Updates:  Session 1   Bathing/dressing OT treatment session focused on modified techniques using long-handled equipment, improved sit<>stand, and pan management. Pt reluctant to participate in ADLs requiring constant cues to initiate and complete tasks thoroughly.  Pt reluctant to attempt modified strategies for LB ADL, wanting OT to assist instead of doing things for herself. With encouragement and set-up A, pt utilized long-handled sponge to wash Le's. Sit<>stand with Mod A and 2 attempts, difficulty extending hips, and required assistance to wash bottom. Pt again arguing with OT on modified strategies to thread painful RLE  into pants first.  Pt states her grandma told her to always put her L Leg first. Unable to reason with pt, despite best efforts. For time management, OT assisted pt with threading R LE and pulling pants over hips. Will continue to work on behavioral barriers limiting modified techniques to increased independence.   Session 2 1:1 OT session focused on weight shifting onto R leg, transfer training, and LB dressing modifications. Slide board transfers w/ set- weight shp and overall min/mod A. Sit to partial stand with Mod A, attempted to manually facilitate  weight shifting onto R leg, but pt submissive to pain and returned to sitting. Graded task to weight shifting in sitting using forward reaching activity to the R. B LE stabilization at the knees. Simulated LB dressing task using reacher. Continues to need demonstration and explanation on LB dressing strategies. Discussion with pt's grandma on behavioral barriers with understanding. Pt left with needs met and grandma present.   Therapy Documentation Precautions:  Precautions Precautions: Fall, Anterior Hip, Other (comment) (No hip precautions) Precaution Comments:  (wound vac, right spastic hemiparesis, glaucoma, CP) Restrictions Weight Bearing Restrictions: Yes RLE Weight Bearing: Weight bearing as tolerated Pain: Pain Assessment Pain Assessment: Faces Pain Score: 8  Faces Pain Scale: Hurts little more Pain Type: Surgical pain Pain Location: Hip Pain Orientation: Right Pain Descriptors / Indicators: Aching Pain Frequency: Constant Pain Onset: With Activity Patients Stated Pain Goal: 4 Pain Intervention(s): Repositioned ADL: ADL ADL Comments: Please see functional navigator for ADL status  See Function Navigator for Current Functional Status.   Therapy/Group: Individual Therapy  Valma Cava 01/28/2017, 2:30 PM

## 2017-01-29 ENCOUNTER — Inpatient Hospital Stay (HOSPITAL_COMMUNITY): Payer: Medicaid Other | Admitting: Occupational Therapy

## 2017-01-29 NOTE — Progress Notes (Signed)
0840 01/29/17 nursing Patient c/o pain mid upper chest; no signs of distress ; maybe more of muscular pain. Pain med given. MD notified. Continue to monitor.

## 2017-01-29 NOTE — Progress Notes (Signed)
Occupational Therapy Session Note  Patient Details  Name: Hannah DowseLatoya D Dosh MRN: [id number] Date of Birth: [birth year]  Today's Date: 01/29/2017 OT Individual Time: 1191-47820730-0815 OT Individual Time Calculation (min): 45 min    Skilled Therapeutic Interventions/Progress Updates: Patient supine in bed eating upon approach for therapy.    Then she said she needed to void, but by this clinician placed BSC from bathroom to bedside, patient stated she'd voided in her brief.  Nurse tech arrived and trech and this clinician helped patient cleanse private area with Max A x2 for cleansing and lateral rolls.  Patient required Total A x 2 for LB dressing and Max Assist for UB bathing and dressing - supine in bed  This patient was left in bed with upon completion of session with call bell and phone in place.     Therapy Documentation Precautions:  Precautions Precautions: Fall, Anterior Hip, Other (comment) (No hip precautions) Precaution Comments:  (wound vac, right spastic hemiparesis, glaucoma, CP) Restrictions Weight Bearing Restrictions: Yes RLE Weight Bearing: Weight bearing as tolerated  Pain:  8/10 in right surgical hip.  RN gave pain meds Pain Assessment Pain Assessment: 0-10 Pain Score: 8  Pain Type: Acute pain;Surgical pain Pain Location: Hip Pain Orientation: Right Pain Descriptors / Indicators: Aching Pain Frequency: Intermittent Pain Onset: On-going Pain Intervention(s): Medication (See eMAR) Multiple Pain Sites: No See Function Navigator for Current Functional Status.   Therapy/Group: Individual Therapy  Bud Faceickett, Daylan Boggess [facility] CenterYeary 01/29/2017, 9:41 AM

## 2017-01-29 NOTE — Progress Notes (Signed)
[facility]IC[facility] REHABILITATION     PROGRESS NOTE  Subjective/Complaints:   Pt thinks lovenox shot caused headache and called 911. Pt aware that she is in [facility] , not oriented to date ROS: HA pain Denies CP, SOB, N/V/D.  Objective: Vital Signs: Blood pressure 115/71, pulse 72, temperature 97.8 F (36.6 C), temperature source Oral, resp. rate 17, SpO2 98 %. No results found.  Recent Labs  01/28/17 0608  WBC 11.6*  HGB 10.9*  HCT 34.5*  PLT 335    Recent Labs  01/28/17 0608  NA 138  K 3.9  CL 105  GLUCOSE 89  BUN 10  CREATININE 0.65  CALCIUM 9.2   CBG (last 3)  No results for input(s): GLUCAP in the last 72 hours.  Wt Readings from Last 3 Encounters:  01/21/17 61.2 kg (135 lb)  01/14/17 61.2 kg (135 lb)  01/13/17 59 kg (130 lb)    Physical Exam:  BP 115/71 (BP Location: Left Arm)   Pulse 72   Temp 97.8 F (36.6 C) (Oral)   Resp 17   SpO2 98%  Constitutional: She appears well-developed and well-nourished.  HENT: Normocephalic and atraumatic.  Eyes: EOMI. No discharge.  Cardiovascular: RRR. No JVD. Respiratory: Effort normal and breath sounds normal.  GI: Soft. Bowel sounds are normal.  Musculoskeletal: She exhibits edema and tenderness.  BLE externally rotated RLE Genuvalgum Neurological: She is alert and oriented Able to follow basic commands without difficulty.  RLE: HF 3-/5, KE 3+/5, 0/5 ADF/PF. Tight adductors (unchanged) Motor: RUE 4/5, mAS: 1+/4 elbow flexors, 3/4 wrist flexors, 1/4 finger flexors (stable) LUE: 4/5 proximal to distal LLE: HF, KE 3/5, ADF/PF 4/5  Skin: Skin is warm and dry.  Right hip incision with staples c/d/i.    Assessment/Plan: 1. 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 Physiatrist is providing close team supervision and 24 hour  management of active medical problems listed below. Physiatrist and rehab team continue to assess barriers to discharge/monitor patient progress toward functional and medical goals.  Function:  Bathing Bathing position   Position: Wheelchair/chair at sink  Bathing parts Body parts bathed by patient: Right arm, Chest, Abdomen, Front perineal area, Right upper leg, Left upper leg, Left arm Body parts bathed by helper: Buttocks, Right lower leg, Left lower leg  Bathing assist Assist Level: Touching or steadying assistance(Pt > 75%), Assistive device Assistive Device Comment: long-handled sponge    Upper Body Dressing/Undressing Upper body dressing   What is the patient wearing?: Pull over shirt/dress     Pull over shirt/dress - Perfomed by patient: Thread/unthread right sleeve, Thread/unthread left sleeve, Put head through opening, Pull shirt over trunk          Upper body assist Assist Level: Set up, Supervision or verbal cues   Set up : To obtain clothing/put away  Lower Body Dressing/Undressing Lower body dressing   What is the patient wearing?: Pants Underwear - Performed by patient: Thread/unthread left underwear leg Underwear - Performed by helper: Thread/unthread right underwear leg, Thread/unthread left underwear leg, Pull underwear up/down Pants- Performed by patient: Thread/unthread right pants leg Pants- Performed by helper: Thread/unthread left pants leg, Pull pants up/down   Non-skid slipper socks- Performed by helper: Don/doff left sock, Don/doff right sock  Lower body assist Assist for lower body dressing: Touching or steadying assistance (Pt > 75%) Assistive Device Comment: Chief of [employer]    Toileting Toileting   Toileting steps completed by patient: Performs perineal hygiene Toileting steps completed by helper: Adjust clothing prior to toileting, Adjust clothing after toileting Toileting Assistive Devices: Grab bar or rail  Toileting assist  Assist level: Two helpers   Transfers Chair/bed transfer   Chair/bed transfer method: Lateral scoot Chair/bed transfer assist level: Touching or steadying assistance (Pt > 75%) Chair/bed transfer assistive device: Sliding board, Armrests     Locomotion Ambulation     Max distance: 3 ft Assist level: Touching or steadying assistance (Pt > 75%)   Wheelchair   Type: Manual Max wheelchair distance: 100 ft Assist Level: Supervision or verbal cues  Cognition Comprehension Comprehension assist level: Understands basic 90% of the time/cues < 10% of the time  Expression Expression assist level: Expresses basic 90% of the time/requires cueing < 10% of the time.  Social Interaction Social Interaction assist level: Interacts appropriately 90% of the time - Needs monitoring or encouragement for participation or interaction.  Problem Solving Problem solving assist level: Solves basic 75 - 89% of the time/requires cueing 10 - 24% of the time  Memory Memory assist level: Recognizes or recalls 75 - 89% of the time/requires cueing 10 - 24% of the time    Medical Problem List and Plan: 1.  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  Cont CIR PT, OT, SLP 2.  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 Pain Management: Scheduled tramadol qid to decrease use of oxycodone.   Scheduled baclofen at bedtime to help with spasticity, increased on 3/6  Lidoderm started 3/4.  Apprehension and behavioral component as well  Improved 4. Mood: LCSW to follow for evaluation and support.  5. Neuropsych: This patient is not fully capable of making decisions on her own behalf. 6. Skin/Wound Care: VAC d/ced 3/8, will d/c staples next week 7. Fluids/Electrolytes/Nutrition: Monitor I/Os 8. Leukocytosis:  WBCs  11.6 on 3/9   Afebrile  Cont to monitor  Urge incontinence at baseline.  UA/UCS neg 9. ABLA:   Hb 10.9 on 3/9  Cont to monitor 10. Glaucoma: Vision stable on current regimen--Timoptic, Xalatan   11. Schizophrenia: Managed on [place] and thorazine daily  12. Uveitis: Home meds resumed 13. Hypoalbuminemia  Supplement initiated 3/3   Cont to monitor 14. Chest pain, MSK    resolved   LOS (Days) 8 A FACE TO FACE EVALUATION WAS PERFORMED  KIRSTEINS,ANDREW E 01/29/2017 9:02 AM

## 2017-01-30 ENCOUNTER — Inpatient Hospital Stay (HOSPITAL_COMMUNITY): Payer: Medicaid Other

## 2017-01-30 NOTE — Progress Notes (Signed)
Physical Therapy Weekly Progress Note  Patient Details  Name: Hannah Kennedy MRN: [id number] Date of Birth: [birth year]  Beginning of progress report period: January 22, 2017 End of progress report period: January 30, 2017  Today's Date: 01/30/2017  Patient has met 1 of 7 long term goals due to short term goals not set on evaluation. Patient currently requires min-mod A for functional transfers and supervision-min A for wheelchair propulsion. Patient limited by increased R LE pain with weightbearing and decreased willingness to attempt standing and progress ambulation due to pain with functional mobility.   Patient continues to demonstrate the following deficits R hip pain, muscle weakness, muscle joint tightness and muscle paralysis, decreased cardiorespiratoy endurance, impaired timing and sequencing, abnormal tone, unbalanced muscle activation and decreased coordination, decreased awareness, decreased problem solving, decreased memory and delayed processing and decreased standing balance, decreased postural control, hemiplegia and decreased balance strategies and therefore will continue to benefit from skilled PT intervention to increase functional independence with mobility.  Patient not progressing toward long term goals.  See goal revision.  Plan of care revisions: ambulation and stair goals discharged.  PT Short Term Goals Week 2:  PT Short Term Goal 1 (Week 2): = LTGs due to anticipated LOS  Therapy Documentation Precautions:  Precautions Precautions: Fall Precaution Comments: CP with R spastic hemiplegia, glaucoma Restrictions Weight Bearing Restrictions: Yes RLE Weight Bearing: Weight bearing as tolerated Other Position/Activity Restrictions: no hip precautions   Leo Weyandt, Wells Guiles A 01/30/2017, 8:59 AM

## 2017-01-30 NOTE — Plan of Care (Signed)
Problem: RH Ambulation Goal: LTG Patient will ambulate in controlled environment (PT) LTG: Patient will ambulate in a controlled environment, # of feet with assistance (PT).  Outcome: Not Applicable Date Met: 08/81/10 Patient unable to tolerate functional ambulation due to R LE pain with weightbearing at this time. Goal: LTG Patient will ambulate in home environment (PT) LTG: Patient will ambulate in home environment, # of feet with assistance (PT).  Outcome: Not Applicable Date Met: 31/59/45 Patient unable to tolerate functional ambulation due to R LE pain with weightbearing at this time.

## 2017-01-30 NOTE — Progress Notes (Signed)
[employer] PHYSICAL MEDICINE & REHABILITATION     PROGRESS NOTE  Subjective/Complaints:   Does not remember me from yesterday Right hip pain leteral not incisional, no new trauma ROS:  Denies CP, SOB, N/V/D.  Objective: Vital Signs: Blood pressure 115/70, pulse 83, temperature 97.8 F (36.6 C), temperature source Oral, resp. rate 18, SpO2 96 %. No results found.  Recent Labs  01/28/17 0608  WBC 11.6*  HGB 10.9*  HCT 34.5*  PLT 335    Recent Labs  01/28/17 0608  NA 138  K 3.9  CL 105  GLUCOSE 89  BUN 10  CREATININE 0.65  CALCIUM 9.2   CBG (last 3)  No results for input(s): GLUCAP in the last 72 hours.  Wt Readings from Last 3 Encounters:  01/21/17 61.2 kg (135 lb)  01/14/17 61.2 kg (135 lb)  01/13/17 59 kg (130 lb)    Physical Exam:  BP 115/70 (BP Location: Left Arm)   Pulse 83   Temp 97.8 F (36.6 C) (Oral)   Resp 18   SpO2 96%  Constitutional: She appears well-developed and well-nourished.  HENT: Normocephalic and atraumatic.  Eyes: EOMI. No discharge.  Cardiovascular: RRR. No JVD. Respiratory: Effort normal and breath sounds normal.  GI: Soft. Bowel sounds are normal.  Musculoskeletal: She exhibits no chest tenderness  RLE Genuvalgum Neurological: She is alert and oriented Able to follow basic commands without difficulty.  RLE: HF 3-/5, KE 3+/5, 0/5 ADF/PF.  Motor: RUE 4/5, mAS: 1+/4 elbow flexors, 3/4 wrist flexors, 1/4 finger flexors (stable) LUE: 4/5 proximal to distal LLE: HF, KE 3/5, ADF/PF 4/5  Skin: Skin is warm and dry.  Right hip incision with staples c/d/i.  R troch bursa  tenderness  Assessment/Plan: 1. 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 Physiatrist is providing close team supervision and 24 hour management of active medical problems listed  below. Physiatrist and rehab team continue to assess barriers to discharge/monitor patient progress toward functional and medical goals.  Function:  Bathing Bathing position   Position: Bed  Bathing parts Body parts bathed by patient: Right arm, Chest, Abdomen, Front perineal area, Right upper leg, Left upper leg Body parts bathed by helper: Buttocks, Left arm, Right lower leg, Left lower leg, Back  Bathing assist Assist Level: 2 helpers Assistive Device Comment: long-handled sponge    Upper Body Dressing/Undressing Upper body dressing   What is the patient wearing?: Bra, Pull over shirt/dress Bra - Perfomed by patient: Thread/unthread left bra strap Bra - Perfomed by helper: Thread/unthread right bra strap, Thread/unthread left bra strap, Hook/unhook bra (pull down sports bra) Pull over shirt/dress - Perfomed by patient: Thread/unthread left sleeve Pull over shirt/dress - Perfomed by helper: Thread/unthread right sleeve, Put head through opening, Pull shirt over trunk        Upper body assist Assist Level: Set up, Supervision or verbal cues   Set up : To obtain clothing/put away  Lower Body Dressing/Undressing Lower body dressing   What is the patient wearing?: Pants Underwear - Performed by patient: Thread/unthread left underwear leg Underwear - Performed by helper: Thread/unthread right underwear leg, Thread/unthread left underwear leg, Pull underwear up/down Pants- Performed by patient: Thread/unthread right pants leg Pants- Performed by helper: Thread/unthread right pants leg, Thread/unthread left pants leg, Pull pants up/down   Non-skid slipper socks- Performed by helper: Don/doff left  sock, Don/doff right sock                  Lower body assist Assist for lower body dressing: 2 Helpers Assistive Device Comment: Chief of [employer]    Toileting Toileting   Toileting steps completed by patient: Performs perineal hygiene Toileting steps completed by helper: Adjust clothing  prior to toileting, Adjust clothing after toileting Toileting Assistive Devices: Grab bar or rail  Toileting assist Assist level: Two helpers   Transfers Chair/bed transfer   Chair/bed transfer method: Lateral scoot Chair/bed transfer assist level: Touching or steadying assistance (Pt > 75%) Chair/bed transfer assistive device: Sliding board, Armrests     Locomotion Ambulation     Max distance: 3 ft Assist level: Touching or steadying assistance (Pt > 75%)   Wheelchair   Type: Manual Max wheelchair distance: 100 ft Assist Level: Supervision or verbal cues  Cognition Comprehension Comprehension assist level: Follows basic conversation/direction with no assist  Expression Expression assist level: Expresses basic 90% of the time/requires cueing < 10% of the time.  Social Interaction Social Interaction assist level: Interacts appropriately 90% of the time - Needs monitoring or encouragement for participation or interaction.  Problem Solving Problem solving assist level: Solves basic 75 - 89% of the time/requires cueing 10 - 24% of the time  Memory Memory assist level: Recognizes or recalls 75 - 89% of the time/requires cueing 10 - 24% of the time    Medical Problem List and Plan: 1.  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  Cont CIR PT, OT, SLP 2.  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 Pain Management: Scheduled tramadol qid to decrease use of oxycodone.   Scheduled baclofen at bedtime to help with spasticity, increased on 3/6  Lidoderm started 3/4.  Apprehension and behavioral component as well   4. Mood: LCSW to follow for evaluation and support.  5. Neuropsych: This patient is not fully capable of making decisions on her own behalf. 6. Skin/Wound Care: VAC d/ced 3/8, will d/c staples next week 7. Fluids/Electrolytes/Nutrition: Monitor  I/Os 8. Leukocytosis:  WBCs 11.6 on 3/9   Afebrile  Cont to monitor  Urge incontinence at baseline.  UA/UCS neg 9. ABLA:   Hb 10.9 on 3/9  Cont to monitor 10. Glaucoma: Vision stable on current regimen--Timoptic, Xalatan   11. Schizophrenia: Managed on [place] Sust[place] and thorazine daily  12. Uveitis: Home meds resumed 13. Hypoalbuminemia  Supplement initiated 3/3   Cont to monitor 14. Chest pain, MSK    resolved 15.  Cognitive deficits poor memory, argues that I didn't see her yesterday, likely from CP  LOS (Days) 9 A FACE TO FACE EVALUATION WAS PERFORMED  Claudette LawsKIRSTEINS,ANDREW E 01/30/2017 8:29 AM

## 2017-01-30 NOTE — Progress Notes (Signed)
Physical Therapy Note  Patient Details  Name: Deeann DowseLatoya D Merced MRN: [id number] Date of Birth: Apr 15, 1986 Today's Date: 01/30/2017  0930-1030, 60 min individual tx Pain: 7/10, premedicated   W/c propulsion using hemi method on level tile x 100' with min assist, extra time.  Stand pivot with PFRW w/c> NuStep.neuromuscular re-education via forced use, hand over hand assist for alternating reciprocal movement and R hip PROm for minimal range.  Pt objected to having RLe on foot plate of NuStep but was eventually able to tolerate it for a brief period.Seated therapeutic activity for AROM R hip with forward reachingh activity with L hand, placing pegs in peg board following a simple row arrangement. PT pushed pt in w/c back to room, and donned quick release belt and placed all needs within reach.  Pt refused seat alarm for w/c, becoming agitated.  PT requested Jasmine, NT approach pt about seat alarm.  See function navigator for current status   Gabryella Murfin 01/30/2017, 8:19 AM

## 2017-01-31 ENCOUNTER — Inpatient Hospital Stay (HOSPITAL_COMMUNITY): Payer: Medicaid Other | Admitting: Occupational Therapy

## 2017-01-31 ENCOUNTER — Inpatient Hospital Stay (HOSPITAL_COMMUNITY): Payer: Medicaid Other | Admitting: Physical Therapy

## 2017-01-31 DIAGNOSIS — G802 Spastic hemiplegic cerebral palsy: Secondary | ICD-10-CM

## 2017-01-31 MED ORDER — CLONAZEPAM 0.5 MG PO TABS
0.5000 mg | ORAL_TABLET | Freq: Two times a day (BID) | ORAL | Status: DC | PRN
  Administered 2017-02-01: 0.5 mg via ORAL
  Filled 2017-01-31: qty 1

## 2017-01-31 MED ORDER — TRAMADOL HCL 50 MG PO TABS
50.0000 mg | ORAL_TABLET | Freq: Three times a day (TID) | ORAL | Status: DC
  Administered 2017-01-31 – 2017-02-07 (×25): 50 mg via ORAL
  Filled 2017-01-31 (×27): qty 1
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
## 2017-01-31 NOTE — Progress Notes (Signed)
Physical Therapy Session Note  Patient Details  Name: Hannah Kennedy MRN: [id number] Date of Birth: 30-Mar-1986  Today's Date: 01/31/2017 PT Individual Time: 1001-1059 PT Individual Time Calculation (min): 58 min   Short Term Goals: Week 2:  PT Short Term Goal 1 (Week 2): = LTGs due to anticipated LOS  Skilled Therapeutic Interventions/Progress Updates:    Pt sleeping in bed upon arrival, needing encouragement to participate with PT initially. Starting with exercises in bed including QS, heel slides, hip abd/add, SAQ (all 1X10) with repeated cues for pt to assist with exercises. Pt requesting to get pants on before getting OOB. Total assist needed to don pants in bed and pulled fully up during first stand from EOB. Once sitting pt able to perform sit<>stand X2 and stand pivot to w/c on second attempt. From w/c pt able to stand again but unwilling to attempt steps despite encouragement. Mod assist needed and cues for hand placement with standing, encouraging use of Rt LE as able. Pt willing to propel self in in w/c but then refusing because it "is too cold". With fatigue the pt needed increased encouragement to participate and increased agitation noted. Pt sitting in w/c with safety belt and chair alarm set. Pt has call light within reach.   Therapy Documentation Precautions:  Precautions Precautions: Fall Precaution Comments: CP with R spastic hemiplegia, glaucoma Restrictions Weight Bearing Restrictions: Yes RLE Weight Bearing: Weight bearing as tolerated Other Position/Activity Restrictions: no hip precautions   Pain:  Faces scale, 4/10 with grimacing and guarding with mobility through Rt LE, monitored pain and modified activity as needed.    See Function Navigator for Current Functional Status.   Therapy/Group: Individual Therapy  Christiane HaBenjamin J. Saidi Santacroce, PT, CSCS Pager [phone] Office [phone]  01/31/2017, 4:17 PM
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
## 2017-01-31 NOTE — Progress Notes (Addendum)
Occupational Therapy Weekly Progress Note  Patient Details  Name: Hannah Kennedy MRN: [id number] Date of Birth: 1986-01-11  Beginning of progress report period: January 22, 2017 End of progress report period: January 31, 2017  Today's Date: 01/31/2017  Session 1 OT Individual Time: 1937-9024 OT Individual Time Calculation (min): 62 min   Session 2 OT Individual Time: 0973-5329 OT Individual Time Calculation (min): 74 min   Patient has met 2 of 4 short term goals.  Pt currently requires min/mod A for functional transfers, but is self-limiting in her progression with ADL self-care goals. Pt has been unwilling to learn modified techniques and strategies and is very submissive to R LE pain.  Patient continues to demonstrate the following deficits: muscle weakness and decreased standing balance, decreased postural control and decreased balance strategies and therefore will continue to benefit from skilled OT intervention to enhance overall performance with BADL and Reduce care partner burden.  Patient progressing toward long term goals..  Continue plan of care.  OT Short Term Goals Week 1:  OT Short Term Goal 1 (Week 1): Pt will complete sit<stand during bathing with Mod A and LRAD OT Short Term Goal 1 - Progress (Week 1): Met OT Short Term Goal 2 (Week 1): Pt will complete toilet transfer with Mod A and LRAD OT Short Term Goal 2 - Progress (Week 1): Met OT Short Term Goal 3 (Week 1): Pt will complete bathing with Min A with use of AE as needed  OT Short Term Goal 3 - Progress (Week 1): Progressing toward goal OT Short Term Goal 4 (Week 1): Pt will complete LB dressing with Mod A  OT Short Term Goal 4 - Progress (Week 1): Progressing toward goal Week 2:  OT Short Term Goal 1 (Week 2): LTG=STG 2/2 estimated LOS  Skilled Therapeutic Interventions/Progress Updates:   Session 1   OT treatment session focused on modified bathing/dressing, improved sit<>stand, weight shifting, and behavioral  modifications. Pt greeted in bathroom after voiding. Min/Mod A sit<>stand from raised toilet, with assistance to reach pants and pull up over hips. Pivot > wc w/ min A. Pt brought out to sink for bathing/dressing, but had difficulty paying attention to task 2/2 Tv. T.V t urned off/un-plugged toreduce environmental distractions and pt began irately yelling profanity, pt got the phone, called her grandma and continued to yell. Nurse manager, nurse, and nurse tech entered room to help de-escalate the situation. Pt became physical towards staff and unable to reason with pt initially. OT assisted with keep patient safe as she attempted to stand and "walk out" multiple times. Pt got to standing and OT would safely assist her back to sitting. PA Pam, entered room as well as pt began to calm down. Pt agreeable to continue working with therapy.  OT played gospel music for remainder of session as meaningful to pt, and discussed productive strategies for better communication when pt is agitated.  OT also discussed initial reasoning for turning off TV and that TV will be turned off for all therapy for the remainder of her stay-pt reports understanding without complaint.   LB bathing/dressing completed at the sink with Min A sit<>stand and focus on weight shifting to sore R Le. Pt tolerated ~5 minutes in standing with min guard A for dynamic balance while reaching to wash peri-area and buttocks. Discussed modified strategies using reacher to don pants. Pt able to thread R LE through pant leg with Min/Mod A, verbal cues, and set-up with reacher. Pt able to  tolerate more hip flexion today to reach for L pant leg to thread LLE. Min A sit<>stand w/ assistance to pull pants over hips. Pt positioned comfortably in wc with safety belt on, chair alarm set, and  TV on. Re-iterated conditions of TV use only when not in therapy- pt reports understanding.   Session 2 Pt greeted in wc after finishing lunch and agreeable to Ot. OT  turned off TV as discussed earlier without issue. OT session focused on LB dressing,  wc mobility, and improved sit<>sand. Educated pt on use of sock-aid. Min A to grasp sock-aid and toss over foot 2/2 spastic R UE and difficulty grasping sock-aid. Sit<>stand, weight shifting, and standing balance/endurance with reaching activity using cups and modified rolling walker. Sit<>stand w/ overall Min A, focus on anterior and lateral weight shift to R with st<>stand. Pt reach across midling to R to grasp cup to encourage weight bearing through R Le. Manual facilitation for trunk and hip extension during activity. Pt returned to room at end of session and left with needs met.  \   Therapy Documentation Precautions:  Precautions Precautions: Fall Precaution Comments: CP with R spastic hemiplegia, glaucoma Restrictions Weight Bearing Restrictions: Yes RLE Weight Bearing: Weight bearing as tolerated Other Position/Activity Restrictions: no hip precautions Pain: Pain Assessment Pain Assessment: Faces Pain Score: 0-No pain Faces Pain Scale: Hurts little more Pain Type: Surgical pain Pain Location: Hip Pain Orientation: Right Pain Descriptors / Indicators: Sore Pain Onset: With Activity Pain Intervention(s): Repositioned ADL: ADL ADL Comments: Please see functional navigator for ADL status     See Function Navigator for Current Functional Status.   Therapy/Group: Individual Therapy  Valma Cava 01/31/2017, 3:52 PM

## 2017-02-01 ENCOUNTER — Inpatient Hospital Stay (HOSPITAL_COMMUNITY): Payer: Medicaid Other | Admitting: Physical Therapy

## 2017-02-01 ENCOUNTER — Inpatient Hospital Stay (HOSPITAL_COMMUNITY): Payer: Medicaid Other | Admitting: Occupational Therapy

## 2017-02-01 NOTE — Progress Notes (Signed)
Occupational Therapy Session Note  Patient Details  Name: Hannah Kennedy MRN: [id number] Date of Birth: Aug 22, 1986  Today's Date: 02/01/2017 OT Individual Time: 0805-0900 OT Individual Time Calculation (min): 55 min    Short Term Goals: Week 2:  OT Short Term Goal 1 (Week 2): LTG=STG 2/2 estimated LOS  Skilled Therapeutic Interventions/Progress Updates:    OT treatment session focused on modified bathing/dressing, improved sit<>stand, and dynamic standing balace to perform basic self-care tasks. Set-up A to wash hair using shampoo cap. Sit<>stand at the sink with close supervision and verbal cues for weight shift onto R leg when standing. Min guard A for dynamic balance when reaching to wash bottocks. Set-up A and demonstration for mechanics of reacher. Verbal cues of where to grasp pants w/  Min A to thread  B LE 's. Pt left seated in wc at end of session with needs met.   Therapy Documentation Precautions:  Precautions Precautions: Fall Precaution Comments: CP with R spastic hemiplegia, glaucoma Restrictions Weight Bearing Restrictions: Yes RLE Weight Bearing: Weight bearing as tolerated Other Position/Activity Restrictions: no hip precautions Pain: Pain Assessment Pain Assessment: Faces Pain Score: 8  Pain Type: Acute pain Pain Location: Hip Pain Orientation: Right Pain Descriptors / Indicators: Aching Pain Onset: With Activity Pain Intervention(s): Repositioned ADL: ADL ADL Comments: Please see functional navigator for ADL status   See Function Navigator for Current Functional Status.   Therapy/Group: Individual Therapy  Valma Cava 02/01/2017, 12:52 PM

## 2017-02-01 NOTE — Progress Notes (Signed)
[facility] PHYSICAL MEDICINE & REHABILITATION     PROGRESS NOTE  Subjective/Complaints:  Pt seen sitting up at the edge of the bed this AM.  She slept well overnight.  She states someone did something to her hip and it is hurting, reminded patient of surgery.    ROS:  +Right hip pain. Denies CP, SOB, N/V/D.  Objective: Vital Signs: Blood pressure 107/62, pulse 96, temperature 97.2 F (36.2 C), temperature source Oral, resp. rate 18, SpO2 96 %. No results found. No results for input(s): WBC, HGB, HCT, PLT in the last 72 hours. No results for input(s): NA, K, CL, GLUCOSE, BUN, CREATININE, CALCIUM in the last 72 hours.  Invalid input(s): CO CBG (last 3)  No results for input(s): GLUCAP in the last 72 hours.  Wt Readings from Last 3 Encounters:  01/21/17 61.2 kg (135 lb)  01/14/17 61.2 kg (135 lb)  01/13/17 59 kg (130 lb)    Physical Exam:  BP 107/62 (BP Location: Left Arm)   Pulse 96   Temp 97.2 F (36.2 C) (Oral)   Resp 18   SpO2 96%  Constitutional: She appears well-developed and well-nourished.  HENT: Normocephalic and atraumatic.  Eyes: EOMI. No discharge.  Cardiovascular: RRR. No JVD. Respiratory: Effort normal and breath sounds normal.  GI: Soft. Bowel sounds are normal.  Musculoskeletal: She exhibits no chest tenderness  RLE Genuvalgum Neurological: She is alert and oriented Able to follow basic commands without difficulty.  RLE: HF 3+/5, KE 4-/5, 2/5 ADF/PF (within available ROM).  Motor: RUE 4/5, mAS: 1+/4 elbow flexors, 3/4 wrist flexors, 1/4 finger flexors (stable) LUE: 4/5 proximal to distal LLE: HF, KE 4/5, ADF/PF 4/5  Skin: Skin is warm and dry.  Right hip incision with staples c/d/i.  R troch bursa  tenderness  Assessment/Plan: 1. 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 Physiatrist is providing close team supervision and 24 hour management of active medical problems listed below. Physiatrist and rehab team continue to assess barriers to discharge/monitor patient progress toward functional and medical goals.  Function:  Bathing Bathing position   Position: Wheelchair/chair at sink  Bathing parts Body parts bathed by patient: Right arm, Left arm, Chest, Front perineal area, Abdomen, Buttocks, Right upper leg, Left upper leg Body parts bathed by helper: Right lower leg, Back, Left lower leg  Bathing assist Assist Level: Touching or steadying assistance(Pt > 75%) Assistive Device Comment: long-handled sponge    Upper Body Dressing/Undressing Upper body dressing   What is the patient wearing?: Pull over shirt/dress Bra - Perfomed by patient: Thread/unthread left bra strap Bra - Perfomed by helper: Thread/unthread right bra strap, Thread/unthread left bra strap, Hook/unhook bra (pull down sports bra) Pull over shirt/dress - Perfomed by patient: Thread/unthread left sleeve, Thread/unthread right sleeve, Put head through opening, Pull shirt over trunk Pull over shirt/dress - Perfomed by helper: Thread/unthread right sleeve, Put head through opening, Pull shirt over trunk        Upper body assist Assist Level: Supervision or verbal cues   Set up : To obtain clothing/put away  Lower Body Dressing/Undressing Lower body dressing   What is the patient wearing?: Pants Underwear - Performed by patient: Thread/unthread left underwear leg Underwear - Performed by helper: Thread/unthread right underwear leg, Thread/unthread left underwear leg, Pull underwear up/down Pants- Performed by patient: Thread/unthread right pants leg,  Thread/unthread left pants leg Pants- Performed by helper: Pull pants up/down   Non-skid slipper socks- Performed by helper: Don/doff left sock, Don/doff right sock                  Lower body assist Assist for lower body  dressing: Touching or steadying assistance (Pt > 75%) Assistive Device Comment: Chief of Staff   Toileting steps completed by patient: Performs perineal hygiene Toileting steps completed by helper: Adjust clothing prior to toileting, Adjust clothing after toileting Toileting Assistive Devices: Grab bar or rail  Toileting assist Assist level: Touching or steadying assistance (Pt.75%)   Transfers Chair/bed transfer   Chair/bed transfer method: Stand pivot Chair/bed transfer assist level: Moderate assist (Pt 50 - 74%/lift or lower) Chair/bed transfer assistive device: Walker, Bedrails     Locomotion Ambulation     Max distance: 3 ft Assist level: Touching or steadying assistance (Pt > 75%)   Wheelchair   Type: Manual Max wheelchair distance: 10 Assist Level: Supervision or verbal cues  Cognition Comprehension Comprehension assist level: Understands basic 90% of the time/cues < 10% of the time  Expression Expression assist level: Expresses basic 90% of the time/requires cueing < 10% of the time.  Social Interaction Social Interaction assist level: Interacts appropriately 75 - 89% of the time - Needs redirection for appropriate language or to initiate interaction.  Problem Solving Problem solving assist level: Solves basic 75 - 89% of the time/requires cueing 10 - 24% of the time  Memory Memory assist level: Recognizes or recalls 75 - 89% of the time/requires cueing 10 - 24% of the time    Medical Problem List and Plan: 1.  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  Cont CIR  2.  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 Pain Management: Scheduled tramadol qid to decrease use of oxycodone.   Scheduled baclofen at bedtime to help with spasticity, increased on 3/6  Lidoderm started 3/4.  Apprehension and behavioral component  as well  Controlled 4. Mood: LCSW to follow for evaluation and support.  5. Neuropsych: This patient is not fully capable of making decisions on her own behalf. 6. Skin/Wound Care: VAC d/ced 3/8, will d/c staples  7. Fluids/Electrolytes/Nutrition: Monitor I/Os 8. Leukocytosis:  WBCs 11.6 on 3/9   Labs ordered for tomorrow  Afebrile  Cont to monitor  Urge incontinence at baseline.  UA/UCS neg 9. ABLA:   Hb 10.9 on 3/9  Labs ordered for tomorrow  Cont to monitor 10. Glaucoma: Vision stable on current regimen--Timoptic, Xalatan   11. Schizophrenia: Managed on [place] and thorazine daily  12. Uveitis: Home meds resumed 13. Hypoalbuminemia  Supplement initiated 3/3   Cont to monitor 14. Chest pain, MSK  Resolved  LOS (Days) 11 A FACE TO FACE EVALUATION WAS PERFORMED  Ankit [name] 02/01/2017 9:27 AM

## 2017-02-01 NOTE — Progress Notes (Signed)
Physical Therapy Session Note  Patient Details  Name: Hannah Kennedy MRN: [id number] Date of Birth: 1986/03/08  Today's Date: 02/01/2017 PT Individual Time: 1005-1100 and 1430-1539 PT Individual Time Calculation (min): 55 min and 69 min  Short Term Goals: Week 2:  PT Short Term Goal 1 (Week 2): = LTGs due to anticipated LOS  Skilled Therapeutic Interventions/Progress Updates:   Treatment 1: Patient in wheelchair upon arrival. Patient motivated throughout session by another patient in gym. Patient propelled wheelchair using L hemi technique with supervision and greatly increased time with verbal cues for technique x 115 ft with intermittent rest breaks as needed. Sit <> stand from wheelchair with close supervision and verbal cues for pushing up/reaching back with LUE. Gait training using right PFRW x 14 ft in straight path with supervision-min A. Stair training up/down 4 (3") stairs using 2 rails ascending forward and descending backward with verbal cues for step-to pattern, mod A overall. Practiced squat pivot transfers (without slide board) from wheelchair to mat to L, mat to wheelchair to R after multiple attempts, and mat to wheelchair to L with close supervision and from wheelchair to mat to R with min A with focus on wheelchair setup in preparation with max questioning cues. Seated RLE hip flexion and LAQ x 10 each with c/o R hip pain. Patient left sitting in wheelchair with all needs in reach.   Treatment 2: Patient in wheelchair finishing lunch meal since she slept through lunch with grandmother present to observe therapy. Patient initially perseverative on eating dessert and yelling at PT and grandmother requiring max cues for redirection to task and appropriate behavior. Patient's grandmother and patient educated on need for platform RW for increased BUE support and safety versus rollator that patient was using prior to hip surgery. Demonstrated short distance gait x 5 ft with rollator with  min-mod A and max cues for safety and gait using R PFRW x 15 ft with supervision and min cues for upright posture. Patient propelled wheelchair about 100 ft via L hemi technique with supervision cues for sequencing and attention to task. Stair training up/down 8 (3") stairs using 2 rails with step-to pattern, min A ascending forward and mod > max A descending forward. PT educated patient's grandmother on proper sequencing as grandmother cuing patient to ascend with RLE and descend with LLE who verbalized understanding. Simulated car transfer to midsize SUV height to simulate grandmother's car using PFRW with stand step turn transfer from wheelchair with min A overall and patient able to lift BLE in/out of car without assistance. Patient left sitting in wheelchair with all needs within reach and grandmother in room.   Therapy Documentation Precautions:  Precautions Precautions: Fall Precaution Comments: CP with R spastic hemiplegia, glaucoma Restrictions Weight Bearing Restrictions: Yes RLE Weight Bearing: Weight bearing as tolerated Other Position/Activity Restrictions: no hip precautions Pain: Pain Assessment Pain Assessment: 0-10 Pain Score: 8  Pain Type: Acute pain Pain Location: Hip Pain Orientation: Right Pain Descriptors / Indicators: Aching Pain Onset: With Activity Pain Intervention(s): Repositioned;Rest  See Function Navigator for Current Functional Status.   Therapy/Group: Individual Therapy  Haroun Cotham, Prudencio PairRebecca A 02/01/2017, 12:23 PM

## 2017-02-01 NOTE — Progress Notes (Signed)
Staples discontinued to Right hip per MD order. Patient tolerated procedure with no adverse effects. Incision site clean, dry, and intact. Kennyth ArnoldStacey Jennylee Uehara, RN

## 2017-02-02 ENCOUNTER — Inpatient Hospital Stay (HOSPITAL_COMMUNITY): Payer: Medicaid Other | Admitting: Occupational Therapy

## 2017-02-02 ENCOUNTER — Inpatient Hospital Stay (HOSPITAL_COMMUNITY): Payer: Medicaid Other | Admitting: Physical Therapy

## 2017-02-02 LAB — BASIC METABOLIC PANEL
ANION GAP: 8 (ref 5–15)
BUN: 11 mg/dL (ref 6–20)
CALCIUM: 9.2 mg/dL (ref 8.9–10.3)
CO2: 27 mmol/L (ref 22–32)
Chloride: 101 mmol/L (ref 101–111)
Creatinine, Ser: 0.71 mg/dL (ref 0.44–1.00)
GFR calc Af Amer: >60 mL/min (ref >60)
GLUCOSE: 92 mg/dL (ref 65–99)
Potassium: 4 mmol/L (ref 3.5–5.1)
Sodium: 136 mmol/L (ref 135–145)

## 2017-02-02 LAB — CBC WITH DIFFERENTIAL/PLATELET
BASOS PCT: 0 %
Basophils Absolute: 0.1 10*3/uL (ref 0.0–0.1)
EOS PCT: 1 %
Eosinophils Absolute: 0.1 10*3/uL (ref 0.0–0.7)
HEMATOCRIT: 35.3 % — AB (ref 36.0–46.0)
Hemoglobin: 11.1 g/dL — ABNORMAL LOW (ref 12.0–15.0)
Lymphocytes Relative: 35 %
Lymphs Abs: 4.2 10*3/uL — ABNORMAL HIGH (ref 0.7–4.0)
MCH: 28 pg (ref 26.0–34.0)
MCHC: 31.4 g/dL (ref 30.0–36.0)
MCV: 88.9 fL (ref 78.0–100.0)
MONO ABS: 1 10*3/uL (ref 0.1–1.0)
MONOS PCT: 9 %
NEUTROS ABS: 6.7 10*3/uL (ref 1.7–7.7)
Neutrophils Relative %: 55 %
PLATELETS: 322 10*3/uL (ref 150–400)
RBC: 3.97 MIL/uL (ref 3.87–5.11)
RDW: 14.7 % (ref 11.5–15.5)
WBC: 12.1 10*3/uL — ABNORMAL HIGH (ref 4.0–10.5)

## 2017-02-02 LAB — URINALYSIS, COMPLETE (UACMP) WITH MICROSCOPIC
Bilirubin Urine: NEGATIVE
Glucose, UA: NEGATIVE mg/dL
Hgb urine dipstick: NEGATIVE
Ketones, ur: NEGATIVE mg/dL
NITRITE: NEGATIVE
PH: 5 (ref 5.0–8.0)
Protein, ur: NEGATIVE mg/dL
SPECIFIC GRAVITY, URINE: 1.019 (ref 1.005–1.030)

## 2017-02-02 NOTE — Progress Notes (Signed)
[place] PHYSICAL MEDICINE & REHABILITATION     PROGRESS NOTE  Subjective/Complaints:  Pt seen laying in bed this AM.  She slept well overnight.  She states her right hip feels better.    ROS:  +Right hip pain. Denies CP, SOB, N/V/D.  Objective: Vital Signs: Blood pressure 111/69, pulse 96, temperature 97.9 F (36.6 C), temperature source Oral, resp. rate 19, SpO2 96 %. No results found.  Recent Labs  02/02/17 0441  WBC 12.1*  HGB 11.1*  HCT 35.3*  PLT 322    Recent Labs  02/02/17 0441  NA 136  K 4.0  CL 101  GLUCOSE 92  BUN 11  CREATININE 0.71  CALCIUM 9.2   CBG (last 3)  No results for input(s): GLUCAP in the last 72 hours.  Wt Readings from Last 3 Encounters:  01/21/17 61.2 kg (135 lb)  01/14/17 61.2 kg (135 lb)  01/13/17 59 kg (130 lb)    Physical Exam:  BP 111/69 (BP Location: Left Leg)   Pulse 96   Temp 97.9 F (36.6 C) (Oral)   Resp 19   SpO2 96%  Constitutional: She appears well-developed and well-nourished.  HENT: Normocephalic and atraumatic.  Eyes: EOMI. No discharge.  Cardiovascular: RRR. No JVD. Respiratory: Effort normal and breath sounds normal.  GI: Soft. Bowel sounds are normal.  Musculoskeletal: She exhibits no chest tenderness  RLE Genuvalgum Neurological: She is alert and oriented Able to follow basic commands without difficulty.  Motor:  RLE: HF 3+/5, KE 4-/5, 2/5 ADF/PF (within available ROM).  RUE 4/5, mAS: 1+/4 elbow flexors, 3/4 wrist flexors, 1+/4 finger flexors  LUE: 4/5 proximal to distal LLE: HF, KE 4/5, ADF/PF 4/5  Skin: Skin is warm and dry.  Right hip incision c/d/i.  Assessment/Plan: 1. 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 Physiatrist is providing close team supervision and 24 hour management of active medical problems listed  below. Physiatrist and rehab team continue to assess barriers to discharge/monitor patient progress toward functional and medical goals.  Function:  Bathing Bathing position   Position: Wheelchair/chair at sink  Bathing parts Body parts bathed by patient: Right arm, Left arm, Chest, Abdomen, Front perineal area, Buttocks Body parts bathed by helper: Right lower leg, Back, Left lower leg  Bathing assist Assist Level: Touching or steadying assistance(Pt > 75%) Assistive Device Comment: long-handled sponge    Upper Body Dressing/Undressing Upper body dressing   What is the patient wearing?: Bra, Pull over shirt/dress Bra - Perfomed by patient: Thread/unthread right bra strap, Thread/unthread left bra strap, Hook/unhook bra (pull down sports bra) Bra - Perfomed by helper: Thread/unthread right bra strap, Thread/unthread left bra strap, Hook/unhook bra (pull down sports bra) Pull over shirt/dress - Perfomed by patient: Thread/unthread left sleeve, Put head through opening, Thread/unthread right sleeve, Pull shirt over trunk Pull over shirt/dress - Perfomed by helper: Thread/unthread right sleeve, Put head through opening, Pull shirt over trunk        Upper body assist Assist Level: Set up   Set up : To obtain clothing/put away  Lower Body Dressing/Undressing Lower body dressing   What is the patient wearing?: Pants Underwear - Performed by patient: Thread/unthread left underwear leg Underwear - Performed by helper: Thread/unthread right underwear leg, Thread/unthread left underwear leg, Pull underwear up/down Pants- Performed by patient: Thread/unthread left pants leg, Pull pants  up/down Pants- Performed by helper: Thread/unthread right pants leg   Non-skid slipper socks- Performed by helper: Don/doff left sock, Don/doff right sock                  Lower body assist Assist for lower body dressing: Touching or steadying assistance (Pt > 75%) Assistive Device Comment: Radio producereacher     Toileting Toileting   Toileting steps completed by patient: Performs perineal hygiene Toileting steps completed by helper: Adjust clothing prior to toileting, Adjust clothing after toileting Toileting Assistive Devices: Grab bar or rail  Toileting assist Assist level: Touching or steadying assistance (Pt.75%)   Transfers Chair/bed transfer   Chair/bed transfer method: Squat pivot Chair/bed transfer assist level: Touching or steadying assistance (Pt > 75%) Chair/bed transfer assistive device: Armrests     Locomotion Ambulation     Max distance: 15 ft Assist level: Touching or steadying assistance (Pt > 75%)   Wheelchair   Type: Manual Max wheelchair distance: 115 ft Assist Level: Supervision or verbal cues  Cognition Comprehension Comprehension assist level: Understands basic 90% of the time/cues < 10% of the time  Expression Expression assist level: Expresses basic 90% of the time/requires cueing < 10% of the time.  Social Interaction Social Interaction assist level: Interacts appropriately 75 - 89% of the time - Needs redirection for appropriate language or to initiate interaction.  Problem Solving Problem solving assist level: Solves basic 75 - 89% of the time/requires cueing 10 - 24% of the time  Memory Memory assist level: Recognizes or recalls 75 - 89% of the time/requires cueing 10 - 24% of the time    Medical Problem List and Plan: 1.  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  Cont CIR  2.  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 Pain Management: Scheduled tramadol qid to decrease use of oxycodone.   Scheduled baclofen at bedtime to help with spasticity, increased on 3/6  Lidoderm started 3/4.  Apprehension and behavioral component as well  Controlled 2/14 4. Mood: LCSW to follow for evaluation and support.  5. Neuropsych:  This patient is not fully capable of making decisions on her own behalf. 6. Skin/Wound Care: VAC d/ced 3/8, d/ced staples  7. Fluids/Electrolytes/Nutrition: Monitor I/Os 8. Leukocytosis:  WBCs 12.1 on 3/14   Afebrile  Cont to monitor  Urge incontinence at baseline.  UA/UCS neg  Repeat UA/Ucx ordered 9. ABLA:   Hb 11.1 on 3/14  Cont to monitor 10. Glaucoma: Vision stable on current regimen--Timoptic, Xalatan   11. Schizophrenia: Managed on [place]ve[place] Sust[place] and thorazine daily  12. Uveitis: Home meds resumed 13. Hypoalbuminemia  Supplement initiated 3/3   Cont to monitor 14. Chest pain, MSK  Resolved  LOS (Days) 12 A FACE TO FACE EVALUATION WAS PERFORMED  Ryheem Jay Karis Jubanil Miakoda Mcmillion 02/02/2017 10:58 AM

## 2017-02-02 NOTE — Patient Care Conference (Signed)
Inpatient RehabilitationTeam Conference and Plan of Care Update Date: 02/02/2017   Time: 2:20 PM    Patient Name: Hannah Kennedy      Medical Record Number: [id number]  Date of Birth: [birth year] Sex: Female         Room/Bed: 4W25C/4W25C-01 Payor Info: Payor: MEDICAID [place] / Plan: MEDICAID OF [place] / Product Type: *No Product type* /    Admitting Diagnosis: R ThR CP, Parkinsonism  Admit Date/Time:  01/21/2017  1:07 PM Admission Comments: No comment available   Primary Diagnosis:  <principal problem not specified> Principal Problem: <principal problem not specified>  Patient Active Problem List   Diagnosis Date Noted  . Spastic hemiplegic cerebral palsy (HCC)   . Chest pain   . Abnormality of gait   . History of DVT (deep vein thrombosis)   . Post-operative pain   . Spastic hemiplegia affecting nondominant side (HCC)   . Leukocytosis   . Acute blood loss anemia   . Glaucoma   . Uveitis   . Status post total replacement of right hip   . 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 Cerebral palsy (HCC) 01/04/2017  . Schizophrenia (HCC) 01/04/2017  . Avascular necrosis of bone of hip, right (HCC) 01/04/2017  . Hx of pulmonary embolus 01/04/2017    Expected Discharge Date: Expected Discharge Date: 02/08/17  Team Members Present: Physician leading conference: Dr. [name] Social Worker Present: Dossie Der, LCSW Nurse Present: Carmie End, RN PT Present: Bayard Hugger, PT OT Present: Kearney Hard, OT SLP Present: Fae Pippin, SLP PPS Coordinator present : Tora Duck, RN, CRRN     Current Status/Progress Goal Weekly Team Focus  Medical   Gait abnormality, limitations in self-care secondary to right hip AVN s/p THA in patient with spatic right hemiparesis secondary to CP on 2/27  Improve mobility, safety, pain  See above   Bowel/Bladder   occasional incontinence of urine d/t urgency or during night if sleeping soundly, LBM 3/12  maintain b/b status   toileting q 3 hours, continue with stool softeners, prn miralax   Swallow/Nutrition/ Hydration             ADL's   Min/Mod A overall  Min A/supervision overall  Functional mobility training, LB self-care, pt/family education   Mobility   supervision-min A except mod-max A stairs  Supervision-min A, mod A stairs (reactivated previously DC'd gait and stairs goal due to progress)  functional mobility training, LE strengthening/ROM, pain management, standing balance, safety, activity tolerance, pt/family education   Communication             Safety/Cognition/ Behavioral Observations            Pain   c/o pain to right hip, cp after lovenox or after lab draw(anxiety after needles?), scheduled tramadol and baclofen, prn tylenol, roxi, robaxin.R hip sutures removed 3/13.  maintain pain < or = to 4  assess pain q shift and prn.   Skin   surgical incision right hip, OTA, healing, approximated, staples removed 3/13.    continue with wound healing, no skin breakdown while on rehab  assess surgical site q shift and prn, monitor for s/s of breakdown.      *See Care Plan and progress notes for long and short-term goals.  Barriers to Discharge: Mobility, safety, pain, leukocytosis    Possible Resolutions to Barriers:  Therapies, encourage coping with pain, monitor labs, UA/Ucx ordered    Discharge Planning/Teaching Needs:  Plan to return to group home,  grandmother here yesterday to observe in therapies. Pt participatingin therapies but needs to pushed at times.      Team Discussion:  Pt making good progress in therapies-ambulated 40 ft in PT. Sutures out 3/13. Pain is better managed. Has bladder urgency at night. Inconsistent at times more motivated when doing what she wants to do. Grandmother here yesterday to see in therapies. MD ordered UA to check.  Revisions to Treatment Plan:  DC 3/20   Continued Need for Acute Rehabilitation Level of Care: The patient requires daily medical management  by a physician with specialized training in physical medicine and rehabilitation for the following conditions: Daily direction of a multidisciplinary physical rehabilitation program to ensure safe treatment while eliciting the highest outcome that is of practical value to the patient.: Yes Daily medical management of patient stability for increased activity during participation in an intensive rehabilitation regime.: Yes Daily analysis of laboratory values and/or radiology reports with any subsequent need for medication adjustment of medical intervention for : Post surgical problems;Neurological problems;Other  Lucy Chris 02/02/2017, 3:56 PM

## 2017-02-02 NOTE — Progress Notes (Signed)
Physical Therapy Session Note  Patient Details  Name: Hannah DowseLatoya D Chason MRN: [id number] Date of Birth: 20-Dec-1985  Today's Date: 02/02/2017 PT Individual Time: 1000-1100 and 1300-1400 PT Individual Time Calculation (min): 60 min and 60 min  Short Term Goals: Week 2:  PT Short Term Goal 1 (Week 2): = LTGs due to anticipated LOS  Skilled Therapeutic Interventions/Progress Updates:   Treatment 1: Patient in wheelchair finishing ice cream. Patient propelled wheelchair via L hemi technique x 115 ft with supervision faded to min A to navigate turn through door with increased time and max verbal cues for sequencing turning. Patient performed sit <> stand transfers using PFRW with supervision and cues for L hand placement. Gait training using PFRW x 40 ft with close supervision and min A for side stepping to sit on NuStep. Performed NuStep using BLE and LUE at level 1 x 5 min, level 2 x 1 min, and level 3 x 1 min progressed to using BLE only level 1 x 1 min with rest breaks between trials and initial max verbal cues for attention and task continuation. Stand pivot using PFRW back to wheelchair from NuStep with steady assist and cues for backing up to seat and hand placement. Patient left sitting in wheelchair with quick release belt and chair alarm on with needs within reach.   Treatment 2:  Patient in wheelchair upon arrival. Patient participated in recreational therapy pet visit from wheelchair level. Gait training using right PFRW with focus on turns, x 10 ft + 5 ft with supervision, increased time and assist to manage RW safely during turns with distance limited by LLE pain. Kinetron from wheelchair level for BLE ROM and strengthening at 80-90 cm/sec for total of 10 min with max cues for task continuation. Standing tolerance using RW 2 x 5 min while working on weight shifting, patient listening and singing to favorite songs to increase participation in session. Patient left sitting in wheelchair with all  needs within reach and grandmother in room.  Therapy Documentation Precautions:  Precautions Precautions: Fall Precaution Comments: CP with R spastic hemiplegia, glaucoma Restrictions Weight Bearing Restrictions: Yes RLE Weight Bearing: Weight bearing as tolerated Other Position/Activity Restrictions: no hip precautions Pain: Pain Assessment Pain Assessment: 0-10 Pain Score: 5  Pain Type: Acute pain Pain Location: Hip Pain Orientation: Right Pain Descriptors / Indicators: Aching Pain Onset: With Activity Pain Intervention(s): Rest;Repositioned  See Function Navigator for Current Functional Status.   Therapy/Group: Individual Therapy  Senica Crall, Prudencio PairRebecca A 02/02/2017, 11:00 AM

## 2017-02-02 NOTE — Progress Notes (Signed)
Social Work Patient ID: Hannah Kennedy, female   DOB: Nov 01, 1986, 31 y.o.   MRN: [id number]  Met with pt and grandmother while here and spoke with [place] to inform team conference Progression toward her goals and target discharge still 3/20. All pleased with her progress and her ambulating 40 feet today in PT. [name] asked if she could use a rollator, have tried but unsafe with. PT recommends A platform rolling walker. Discussed follow up and preference of group home. They had planned to use [facility] last time. Will work on discharge needs, grandmother reports she will probably be the one to transport her back to group home on Tuesday.

## 2017-02-02 NOTE — Progress Notes (Signed)
Occupational Therapy Session Note  Patient Details  Name: Hannah Kennedy MRN: [id number] Date of Birth: 1986/11/22  Today's Date: 02/02/2017  Session 1 OT Individual Time: 0801-0900 OT Individual Time Calculation (min): 59 min   Session 2 OT Individual Time: 4068-4033 OT Individual Time Calculation (min): 30 min   Skilled Therapeutic Interventions/Progress Updates:  Session 1   OT treatment session focused on bed mobility, functional transfers, modified dressing, and improved sit<>stand. RN administered pain meds and pt unable to eat breakfast prior to OT arrival resulting in agitation.  With max encouragement, pt agreeable to get dressed at EOB, then transfer to wc to eat breakfast.  Verbal cues and demonstration to recall modified LB dressing technique, dressing surgical leg first. Min A to maniupulate reacher to thread R leg through pants. Pt then able to thread L leg without AE . Sit<>stand using platform walker w/ min A. Verbal and tactile cues to facilitate anterior and lateral weight shift to pull pants over hips. Stand-step turn transfer w/ Min A and assistance to manipulate platform walker. Max A to don TED hose, then OT demonstrated technique don shoes using shoe horn. Assistance to place shoe horn correctly and verbal cues to push heel down into shoe. Stand-pivot wc>raised toilet w/ Min A. Pt left seated on toilet at end of session.   Session 2 OT session focused on wc management, transfer training and LB strengthening/endurance. Pt propelled w/c 20 feet towards therapy gym with verbal cues for technique using L side of body. Stand-pivot transfer to R onto NuStep w/ Min A overall. Constant cues to push with R Le, difficulty w/ reciprocal pattern. Started pt on level 1, bumped to level 2 for 2 minutes, then returned to level 1. Total of 9 mins completed on NuStep. Returned pt to room via wc for time management. Pt left with needs met and grandma present.   Therapy  Documentation Precautions:  Precautions Precautions: Fall Precaution Comments: CP with R spastic hemiplegia, glaucoma Restrictions Weight Bearing Restrictions: Yes RLE Weight Bearing: Weight bearing as tolerated Other Position/Activity Restrictions: no hip precautions ADL: ADL ADL Comments: Please see functional navigator for ADL status     See Function Navigator for Current Functional Status.   Therapy/Group: Individual Therapy  Valma Cava 02/02/2017, 3:39 PM

## 2017-02-03 ENCOUNTER — Inpatient Hospital Stay (HOSPITAL_COMMUNITY): Payer: Medicaid Other | Admitting: Physical Therapy

## 2017-02-03 ENCOUNTER — Inpatient Hospital Stay (HOSPITAL_COMMUNITY): Payer: Medicaid Other | Admitting: Occupational Therapy

## 2017-02-03 DIAGNOSIS — R8290 Unspecified abnormal findings in urine: Secondary | ICD-10-CM | POA: Insufficient documentation

## 2017-02-03 LAB — URINE CULTURE

## 2017-02-03 MED ORDER — POLYETHYLENE GLYCOL 3350 17 G PO PACK
17.0000 g | PACK | Freq: Every day | ORAL | Status: DC
  Administered 2017-02-03 – 2017-02-08 (×6): 17 g via ORAL
  Filled 2017-02-03 (×6): qty 1

## 2017-02-03 NOTE — Progress Notes (Signed)
Occupational Therapy Session Note  Patient Details  Name: Hannah Kennedy MRN: [id number] Date of Birth: 12-19-1985  Today's Date: 02/03/2017 OT Individual Time: 9791-5041 OT Individual Time Calculation (min): 56 min    Short Term Goals: Week 2:  OT Short Term Goal 1 (Week 2): LTG=STG 2/2 estimated LOS  Skilled Therapeutic Interventions/Progress Updates:    1:1 OT session focused on functional mobility, transfer training, weight shifting, trunk extension. Pt ambulated 25 ft with platform walker, Min A to secure spastic R UE onto platform. Min guard A with verbal cues to clear R LE with step. Squat-pivot transfers R<>L with overall min guard A. Lateral weight shifting toward painful R LE using reaching activity. Returned to room at end of session, stand-pivot transfer to toilet with min guard A, voided successfully, w/ min A to pull pants over hips. Set-patient's supper tray up and left pt with needs met.    Therapy Documentation Precautions:  Precautions Precautions: Fall Precaution Comments: CP with R spastic hemiplegia, glaucoma Restrictions Weight Bearing Restrictions: Yes RUE Weight Bearing: Weight bearing as tolerated RLE Weight Bearing: Weight bearing as tolerated Other Position/Activity Restrictions: no hip precautions Pain: Pain Assessment Pain Assessment: 0-10 Pain Score: 5  Pain Type: Surgical pain Pain Location: Hip Pain Orientation: Right Pain Descriptors / Indicators: Aching Pain Onset: With Activity  repositioned ADL: ADL ADL Comments: Please see functional navigator for ADL status  See Function Navigator for Current Functional Status.   Therapy/Group: Individual Therapy  Valma Cava 02/03/2017, 6:09 PM

## 2017-02-03 NOTE — Progress Notes (Signed)
[place] PHYSICAL MEDICINE & REHABILITATION     PROGRESS NOTE  Subjective/Complaints:  Pt seen laying in bed this AM.  She slept well overnight, but is still tired this AM.  She has "some pain" when asked.   ROS:  +Mild right hip pain. Denies CP, SOB, N/V/D.  Objective: Vital Signs: Blood pressure 109/68, pulse 94, temperature 98.2 F (36.8 C), temperature source Oral, resp. rate 20, SpO2 96 %. No results found.  Recent Labs  02/02/17 0441  WBC 12.1*  HGB 11.1*  HCT 35.3*  PLT 322    Recent Labs  02/02/17 0441  NA 136  K 4.0  CL 101  GLUCOSE 92  BUN 11  CREATININE 0.71  CALCIUM 9.2   CBG (last 3)  No results for input(s): GLUCAP in the last 72 hours.  Wt Readings from Last 3 Encounters:  01/21/17 61.2 kg (135 lb)  01/14/17 61.2 kg (135 lb)  01/13/17 59 kg (130 lb)    Physical Exam:  BP 109/68 (BP Location: Left Arm)   Pulse 94   Temp 98.2 F (36.8 C) (Oral)   Resp 20   SpO2 96%  Constitutional: She appears well-developed and well-nourished.  HENT: Normocephalic and atraumatic.  Eyes: EOMI. No discharge.  Cardiovascular: RRR. No JVD. Respiratory: Effort normal and breath sounds normal.  GI: Soft. Bowel sounds are normal.  Musculoskeletal: She exhibits no chest tenderness  RLE Genuvalgum Neurological: She is alert and oriented Able to follow basic commands without difficulty.  Motor:  RLE: HF 3+/5, KE 4-/5, 2/5 ADF/PF (within available ROM, stable).  RUE 4/5, mAS: 1+/4 elbow flexors, 3/4 wrist flexors, 1+/4 finger flexors  LUE: 4/5 proximal to distal LLE: HF, KE 4/5, ADF/PF 4/5  Skin: Skin is warm and dry.  Right hip incision c/d/i.  Assessment/Plan: 1. 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 Physiatrist is providing close team supervision and 24 hour management of active  medical problems listed below. Physiatrist and rehab team continue to assess barriers to discharge/monitor patient progress toward functional and medical goals.  Function:  Bathing Bathing position   Position: Wheelchair/chair at sink  Bathing parts Body parts bathed by patient: Right arm, Left arm, Chest, Abdomen, Front perineal area, Buttocks Body parts bathed by helper: Right lower leg, Back, Left lower leg  Bathing assist Assist Level: Touching or steadying assistance(Pt > 75%) Assistive Device Comment: long-handled sponge    Upper Body Dressing/Undressing Upper body dressing   What is the patient wearing?: Bra, Pull over shirt/dress Bra - Perfomed by patient: Thread/unthread right bra strap, Thread/unthread left bra strap, Hook/unhook bra (pull down sports bra) Bra - Perfomed by helper: Thread/unthread right bra strap, Thread/unthread left bra strap, Hook/unhook bra (pull down sports bra) Pull over shirt/dress - Perfomed by patient: Thread/unthread left sleeve, Put head through opening, Thread/unthread right sleeve, Pull shirt over trunk Pull over shirt/dress - Perfomed by helper: Thread/unthread right sleeve, Put head through opening, Pull shirt over trunk        Upper body assist Assist Level: Set up   Set up : To obtain clothing/put away  Lower Body Dressing/Undressing Lower body dressing   What is the patient wearing?: Pants Underwear - Performed by patient: Thread/unthread left underwear leg Underwear - Performed by helper: Thread/unthread right underwear leg, Thread/unthread left underwear leg, Pull underwear up/down Pants- Performed by patient:  Thread/unthread left pants leg, Pull pants up/down Pants- Performed by helper: Thread/unthread right pants leg   Non-skid slipper socks- Performed by helper: Don/doff left sock, Don/doff right sock                  Lower body assist Assist for lower body dressing: Touching or steadying assistance (Pt > 75%) Assistive  Device Comment: Chief of Staffeacher    Toileting Toileting   Toileting steps completed by patient: Performs perineal hygiene Toileting steps completed by helper: Adjust clothing prior to toileting, Adjust clothing after toileting Toileting Assistive Devices: Grab bar or rail  Toileting assist Assist level: Touching or steadying assistance (Pt.75%)   Transfers Chair/bed transfer   Chair/bed transfer method: Stand pivot Chair/bed transfer assist level: Touching or steadying assistance (Pt > 75%) Chair/bed transfer assistive device: Armrests, Patent attorneyWalker     Locomotion Ambulation     Max distance: 40 ft Assist level: Supervision or verbal cues   Wheelchair   Type: Manual Max wheelchair distance: 115 ft Assist Level: Touching or steadying assistance (Pt > 75%)  Cognition Comprehension Comprehension assist level: Follows basic conversation/direction with no assist  Expression Expression assist level: Expresses basic 90% of the time/requires cueing < 10% of the time.  Social Interaction Social Interaction assist level: Interacts appropriately 90% of the time - Needs monitoring or encouragement for participation or interaction.  Problem Solving Problem solving assist level: Solves basic 75 - 89% of the time/requires cueing 10 - 24% of the time  Memory Memory assist level: Recognizes or recalls 75 - 89% of the time/requires cueing 10 - 24% of the time    Medical Problem List and Plan: 1.  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  Cont CIR  2.  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 Pain Management: Scheduled tramadol qid to decrease use of oxycodone.   Scheduled baclofen at bedtime to help with spasticity, increased on 3/6  Lidoderm started 3/4.  Apprehension and behavioral component as well  Controlled 3/15 4. Mood: LCSW to follow for evaluation and  support.  5. Neuropsych: This patient is not fully capable of making decisions on her own behalf. 6. Skin/Wound Care: VAC d/ced 3/8, d/ced staples  7. Fluids/Electrolytes/Nutrition: Monitor I/Os 8. Leukocytosis:  WBCs 12.1 on 3/14   Afebrile  Cont to monitor  Urge incontinence at baseline.  Repeat UA +, Ucx multiple species, will recollect 9. ABLA:   Hb 11.1 on 3/14  Cont to monitor 10. Glaucoma: Vision stable on current regimen--Timoptic, Xalatan   11. Schizophrenia: Managed on [place] and thorazine daily  12. Uveitis: Home meds resumed 13. Hypoalbuminemia  Supplement initiated 3/3   Cont to monitor 14. Chest pain, MSK  Resolved  LOS (Days) 13 A FACE TO FACE EVALUATION WAS PERFORMED  Ankit Karis Jubanil Patel 02/03/2017 12:16 PM

## 2017-02-03 NOTE — Progress Notes (Signed)
Physical Therapy Session Note  Patient Details  Name: Hannah DowseLatoya D Olesen MRN: [id number] Date of Birth: Oct 16, 1986  Today's Date: 02/03/2017 PT Concurrent Time: 1030-1200 PT Concurrent Time Calculation (min): 90 min  Short Term Goals: Week 2:  PT Short Term Goal 1 (Week 2): = LTGs due to anticipated LOS  Skilled Therapeutic Interventions/Progress Updates:   Patient asleep in bed upon arrival. Patient required increased time to awaken to verbal/tactile cues and encouragement to participate in therapy due to c/o "not wanting my hip to hurt." Continued education provided regarding goals and purpose of PT and need to move to improve mobility and decrease pain after R THR. Patient eventually agreeable to participate and reporting need to void. Transferred to EOB using rail with supervision and performed squat [facility] assist using bed rail. Patient performed hygiene with setup and total A for clothing management. Sit <> stand [facility] and wheelchair with supervision and verbal cues for LUE placement. Stair training up/down 2 + 4 (3") stairs using 2 rails with verbal cues for step-to pattern, min A and increased time. Gait training using R PFRW x 20 ft + 15 ft with increased time and close supervision. Seated BLE therex for strengthening and ROM to tolerance for active rest breaks. Wheelchair propulsion via L hemi technique x 75 ft + 50 ft with supervision and increased time. Squat pivot transfer training from wheelchair to and from level mat surface to R and L with focus on sequencing and technique, supervision-min A overall. Patient requires max cues for wheelchair setup in preparation for transfers. Patient able to identify that she is able to transfer to L with less difficulty than to R with increased time. Patient left sitting in wheelchair with quick release belt donned and needs in reach.   Therapy Documentation Precautions:  Precautions Precautions:  Fall Precaution Comments: CP with R spastic hemiplegia, glaucoma Restrictions Weight Bearing Restrictions: Yes RUE Weight Bearing: Weight bearing as tolerated RLE Weight Bearing: Weight bearing as tolerated Other Position/Activity Restrictions: no hip precautions Pain: Pain Assessment Pain Assessment: Faces Pain Score: 5  Faces Pain Scale: Hurts whole lot Pain Type: Acute pain;Surgical pain Pain Location: Hip Pain Orientation: Right Pain Descriptors / Indicators: Aching Pain Onset: With Activity Pain Intervention(s): Repositioned;Rest;Ambulation/increased activity  See Function Navigator for Current Functional Status.   Therapy/Group: Concurrent  Elleanor Guyett, Prudencio PairRebecca A 02/03/2017, 12:32 PM

## 2017-02-04 ENCOUNTER — Inpatient Hospital Stay (HOSPITAL_COMMUNITY): Payer: Medicaid Other | Admitting: Occupational Therapy

## 2017-02-04 ENCOUNTER — Inpatient Hospital Stay (HOSPITAL_COMMUNITY): Payer: Medicaid Other | Admitting: Physical Therapy

## 2017-02-04 LAB — CBC
HCT: 36.7 % (ref 36.0–46.0)
Hemoglobin: 11.6 g/dL — ABNORMAL LOW (ref 12.0–15.0)
MCH: 28.5 pg (ref 26.0–34.0)
MCHC: 31.6 g/dL (ref 30.0–36.0)
MCV: 90.2 fL (ref 78.0–100.0)
Platelets: 296 K/uL (ref 150–400)
RBC: 4.07 MIL/uL (ref 3.87–5.11)
RDW: 14.9 % (ref 11.5–15.5)
WBC: 10.1 K/uL (ref 4.0–10.5)

## 2017-02-04 LAB — BASIC METABOLIC PANEL WITH GFR
Anion gap: 8 (ref 5–15)
BUN: 11 mg/dL (ref 6–20)
CO2: 29 mmol/L (ref 22–32)
Calcium: 9.5 mg/dL (ref 8.9–10.3)
Chloride: 102 mmol/L (ref 101–111)
Creatinine, Ser: 0.66 mg/dL (ref 0.44–1.00)
GFR calc Af Amer: >60 mL/min (ref >60)
GFR calc non Af Amer: >60 mL/min (ref >60)
Glucose, Bld: 87 mg/dL (ref 65–99)
Potassium: 3.9 mmol/L (ref 3.5–5.1)
Sodium: 139 mmol/L (ref 135–145)

## 2017-02-04 NOTE — Progress Notes (Signed)
Occupational Therapy Session Note  Patient Details  Name: Hannah Kennedy MRN: [id number] Date of Birth: May 01, 1986  Today's Date: 02/04/2017  Session 1 OT Individual Time: 0803-0900 OT Individual Time Calculation (min): 57 min   Session 2 OT Individual Time: 0802-2336 OT Individual Time Calculation (min): 32 min    Short Term Goals: Week 2:  OT Short Term Goal 1 (Week 2): LTG=STG 2/2 estimated LOS  Skilled Therapeutic Interventions/Progress Updates:    1:1 OT session focused on shower transfers and modified bathing/dressing. Stand-pivot transfer wc>transfer bench in shower + grab bars with overall min guard A. Set-up A for bathing tasks w close supervision for standing balance when reaching to wash buttocks. Increased time required for each task 2/2 lethargy. Sit<>stand from wc with Close supervision, able to pull pants over hips with min guard A for balance. Verbal cues and reaching to facilitate anterior and R lateral weight shift with ADL tasks in standing Pt left seated in wc at end of session.  Session 2 1:1 OT session focused on LB dressing techniques. Utilized reacher, shoe-horn, and sock-aid. Worked on strategies to don sock onto sock-aid with spastic R UE. Pt able to complete task with increased time and VC for technique. Demonstration for use of shoe horn to don shoes. Demonstrated increased tolerance of hip flexion to reach down and pull tongue of B shoes. Pt returned to room at end of session and left with needs met seated in wc.  Therapy Documentation Precautions:  Precautions Precautions: Fall Precaution Comments: CP with R spastic hemiplegia, glaucoma Restrictions Weight Bearing Restrictions: Yes RUE Weight Bearing: Weight bearing as tolerated RLE Weight Bearing: Weight bearing as tolerated Other Position/Activity Restrictions: no hip precautions Pain: Pain Assessment Pain Assessment: 0-10 Pain Score: 5  Pain Type: Surgical pain Pain Location: Hip Pain  Orientation: Right Pain Descriptors / Indicators: Aching Pain Onset: On-going Pain Intervention(s): Repositioned ADL: ADL ADL Comments: Please see functional navigator for ADL status Exercises:   Other Treatments:    See Function Navigator for Current Functional Status.   Therapy/Group: Individual Therapy  Valma Cava 02/04/2017, 4:32 PM

## 2017-02-04 NOTE — Progress Notes (Signed)
Physical Therapy Note  Patient Details  Name: Hannah Kennedy MRN: [id number] Date of Birth: 1986/01/06 Today's Date: 02/04/2017    Time: 1400-1455 55 minutes  1:1 Pt c/o Rt and Lt hip pain with activity, eased with rest. Pt requires motivation and encouragement to participate due to c/o fatigue.  Multiple prolonged rest breaks during session.  Sit to stands with PFRW with close supervision/min A with cues for safety and increased time. Pt requires assist to strap Rt UE into PFRW.  Pt performed gait 10', 15' with close supervision.  Blocked practice of transfer training with squat/stand pivot transfers mat <> w/c with min A, min cuing for UE and LE placement.  Stair negotiation x 5 3'' steps with min A with bilat rails with pt ascending fwd and descending bkwd, pt with good safety awareness on stairs.  Pt limited by fatigue this session.   Hannah Kennedy 02/04/2017, 2:53 PM

## 2017-02-04 NOTE — Progress Notes (Signed)
Physical Therapy Session Note  Patient Details  Name: Hannah Kennedy MRN: [id number] Date of Birth: [birth year]  Today's Date: 02/03/2017 PT Individual Time: 1615-1700   PT Individual Time Calculation: 45 min    Short Term Goals: Week 2:  PT Short Term Goal 1 (Week 2): = LTGs due to anticipated LOS  Skilled Therapeutic Interventions/Progress Updates:   Pt received sitting in WC and agreeable to PT  PT transported pt to day room in [facility] for energy conservation. PT instructed patient in blocked practice squat pivot transfers x 8 each direction with min assist progressing to supervision assist. Moderate cues required to proper UE and LE placement as well as for lateral pelvic mobility. Patient demonstrated significantly improved technique by the session with noted decrease in pain and increased safety.   PT also instructed patient in gait training with PFRW x 64f with supervision assist and min cues for AD management in improved step length on the R LE. Pain in RLE, limited increased distance.   [facility] and all needs met.        Therapy Documentation Precautions:  Precautions Precautions: Fall Precaution Comments: CP with R spastic hemiplegia, glaucoma Restrictions Weight Bearing Restrictions: Yes RUE Weight Bearing: Weight bearing as tolerated RLE Weight Bearing: Weight bearing as tolerated Other Position/Activity Restrictions: no hip precautions  Vital Signs: Therapy Vitals Temp: 97.9 F (36.6 C) Temp Source: Axillary Pulse Rate: 89 Resp: 16 BP: 110/67 Patient Position (if appropriate): Lying Oxygen Therapy SpO2: 98 % O2 Device: Not Delivered Pain:   4/10 with movement. In R hip.   See Function Navigator for Current Functional Status.   Therapy/Group: Individual Therapy  ALorie Phenix3/16/2018, 6:18 AM

## 2017-02-04 NOTE — Progress Notes (Signed)
Occupational Therapy Session Note  Patient Details  Name: Hannah DowseLatoya D Dalia MRN: [id number] Date of Birth: Nov 03, 1986  Today's Date: 02/04/2017 OT Individual Time: 1100-1200 OT Individual Time Calculation (min): 60 min    Short Term Goals: Week 2:  OT Short Term Goal 1 (Week 2): LTG=STG 2/2 estimated LOS  Skilled Therapeutic Interventions/Progress Updates:    Pt seen for OT session focusing on functional activity tolerance and balance. Pt sitting up in w/c upon arrival, agreeable to tx session and declining pain. She donned shoes with min A for R shoe. In therapy dance room, pt stood to dance to [facility]Cha slide, tolerated standing duration of song, shifting weight and removing UE from RW- all completed with min A. Pt participated in card game at table top level, requiring to reach to obtain cards for core strengthening/ stability. She stood ~5 minutes while participating in game before requiring seated rest break. Pt returned to room at end of session, left seated in w/c with all needs in reach.   Therapy Documentation Precautions:  Precautions Precautions: Fall Precaution Comments: CP with R spastic hemiplegia, glaucoma Restrictions Weight Bearing Restrictions: Yes RUE Weight Bearing: Weight bearing as tolerated RLE Weight Bearing: Weight bearing as tolerated Other Position/Activity Restrictions: no hip precautions Pain:   No/ denies pain ADL: ADL ADL Comments: Please see functional navigator for ADL status  See Function Navigator for Current Functional Status.   Therapy/Group: Individual Therapy  Lewis, Rhianne Soman C 02/04/2017, 7:04 AM

## 2017-02-04 NOTE — Progress Notes (Signed)
[place] PHYSICAL MEDICINE & REHABILITATION     PROGRESS NOTE  Subjective/Complaints:  Pt seen laying in bed this AM.  She slept well overnight.  She denies complaints.  ROS:  Denies CP, SOB, N/V/D.  Objective: Vital Signs: Blood pressure 101/69, pulse 89, temperature 97.9 F (36.6 C), temperature source Axillary, resp. rate 16, SpO2 98 %. No results found.  Recent Labs  02/02/17 0441 02/04/17 0553  WBC 12.1* 10.1  HGB 11.1* 11.6*  HCT 35.3* 36.7  PLT 322 296    Recent Labs  02/02/17 0441 02/04/17 0553  NA 136 139  K 4.0 3.9  CL 101 102  GLUCOSE 92 87  BUN 11 11  CREATININE 0.71 0.66  CALCIUM 9.2 9.5   CBG (last 3)  No results for input(s): GLUCAP in the last 72 hours.  Wt Readings from Last 3 Encounters:  01/21/17 61.2 kg (135 lb)  01/14/17 61.2 kg (135 lb)  01/13/17 59 kg (130 lb)    Physical Exam:  BP 101/69   Pulse 89   Temp 97.9 F (36.6 C) (Axillary)   Resp 16   SpO2 98%  Constitutional: She appears well-developed and well-nourished.  HENT: Normocephalic and atraumatic.  Eyes: EOMI. No discharge.  Cardiovascular: RRR. No JVD. Respiratory: Effort normal and breath sounds normal.  GI: Soft. Bowel sounds are normal.  Musculoskeletal: She exhibits no chest tenderness  RLE Genuvalgum Neurological: She is alert and oriented Able to follow basic commands without difficulty.  Motor:  RLE: HF 3+/5, KE 4-/5, 2/5 ADF/PF (within available ROM, unchanged).  RUE 4/5, mAS: 1+/4 elbow flexors, 3/4 wrist flexors, 1+/4 finger flexors  LUE: 4/5 proximal to distal LLE: HF, KE 4/5, ADF/PF 4/5  Skin: Skin is warm and dry.  Right hip incision c/d/i.  Assessment/Plan: 1. 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 Physiatrist is providing close team supervision and 24 hour management of  active medical problems listed below. Physiatrist and rehab team continue to assess barriers to discharge/monitor patient progress toward functional and medical goals.  Function:  Bathing Bathing position   Position: Wheelchair/chair at sink  Bathing parts Body parts bathed by patient: Right arm, Left arm, Chest, Abdomen, Front perineal area, Buttocks Body parts bathed by helper: Right lower leg, Back, Left lower leg  Bathing assist Assist Level: Touching or steadying assistance(Pt > 75%) Assistive Device Comment: long-handled sponge    Upper Body Dressing/Undressing Upper body dressing   What is the patient wearing?: Bra, Pull over shirt/dress Bra - Perfomed by patient: Thread/unthread right bra strap, Thread/unthread left bra strap, Hook/unhook bra (pull down sports bra) Bra - Perfomed by helper: Thread/unthread right bra strap, Thread/unthread left bra strap, Hook/unhook bra (pull down sports bra) Pull over shirt/dress - Perfomed by patient: Thread/unthread left sleeve, Put head through opening, Thread/unthread right sleeve, Pull shirt over trunk Pull over shirt/dress - Perfomed by helper: Thread/unthread right sleeve, Put head through opening, Pull shirt over trunk        Upper body assist Assist Level: Set up   Set up : To obtain clothing/put away  Lower Body Dressing/Undressing Lower body dressing   What is the patient wearing?: Pants Underwear - Performed by patient: Thread/unthread left underwear leg Underwear - Performed by helper: Thread/unthread right underwear leg, Thread/unthread left underwear leg, Pull underwear up/down Pants- Performed by patient: Thread/unthread left pants  leg, Pull pants up/down Pants- Performed by helper: Thread/unthread right pants leg   Non-skid slipper socks- Performed by helper: Don/doff left sock, Don/doff right sock                  Lower body assist Assist for lower body dressing: Touching or steadying assistance (Pt >  75%) Assistive Device Comment: Chief of Staff   Toileting steps completed by patient: Performs perineal hygiene Toileting steps completed by helper: Adjust clothing prior to toileting, Adjust clothing after toileting Toileting Assistive Devices: Grab bar or rail  Toileting assist Assist level: Touching or steadying assistance (Pt.75%)   Transfers Chair/bed transfer   Chair/bed transfer method: Squat pivot Chair/bed transfer assist level: Touching or steadying assistance (Pt > 75%) Chair/bed transfer assistive device: Armrests     Locomotion Ambulation     Max distance: 20 ft Assist level: Supervision or verbal cues   Wheelchair   Type: Manual Max wheelchair distance: 75 ft Assist Level: Supervision or verbal cues  Cognition Comprehension Comprehension assist level: Understands basic 90% of the time/cues < 10% of the time  Expression Expression assist level: Expresses basic 90% of the time/requires cueing < 10% of the time.  Social Interaction Social Interaction assist level: Interacts appropriately 50 - 74% of the time - May be physically or verbally inappropriate.  Problem Solving Problem solving assist level: Solves basic 75 - 89% of the time/requires cueing 10 - 24% of the time  Memory Memory assist level: Recognizes or recalls 75 - 89% of the time/requires cueing 10 - 24% of the time    Medical Problem List and Plan: 1.  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  Cont CIR  2.  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 Pain Management: Scheduled tramadol qid to decrease use of oxycodone.   Scheduled baclofen at bedtime to help with spasticity, increased on 3/6  Lidoderm started 3/4.  Apprehension and behavioral component as well  Improving 3/16 4. Mood: LCSW to follow for evaluation and support.  5. Neuropsych:  This patient is not fully capable of making decisions on her own behalf. 6. Skin/Wound Care: VAC d/ced 3/8, d/ced staples  7. Fluids/Electrolytes/Nutrition: Monitor I/Os 8. Leukocytosis: Resolved  WBCs 10.1 on 3/16  Afebrile  Cont to monitor  Urge incontinence at baseline.  Repeat UA +, Ucx multiple species, recollection pending 9. ABLA:   Hb 11.6 on 3/16  Cont to monitor 10. Glaucoma: Vision stable on current regimen--Timoptic, Xalatan   11. Schizophrenia: Managed on [place] and thorazine daily  12. Uveitis: Home meds resumed 13. Hypoalbuminemia  Supplement initiated 3/3   Cont to monitor 14. Chest pain, MSK  Resolved  LOS (Days) 14 A FACE TO FACE EVALUATION WAS PERFORMED  Aashir Umholtz [name] 02/04/2017 11:23 AM

## 2017-02-05 ENCOUNTER — Inpatient Hospital Stay (HOSPITAL_COMMUNITY): Payer: Medicaid Other | Admitting: Physical Therapy

## 2017-02-05 NOTE — Progress Notes (Signed)
Physical Therapy Session Note  Patient Details  Name: Hannah Kennedy MRN: [id number] Date of Birth: 1986-10-27  Today's Date: 02/05/2017 PT Individual Time: 1335-1405 PT Individual Time Calculation (min): 30 min   Short Term Goals: Week 2:  PT Short Term Goal 1 (Week 2): = LTGs due to anticipated LOS  Skilled Therapeutic Interventions/Progress Updates:  Pt presented asleep but quickly aroused. Pt stating does not "do therapy on Saturdays" and had not eaten lunch. Encouraged pt to eat lunch and once eaten to attempt participation in therapy. PTA later returning and pt agreeable to participate once PTA showed schedule to pt. Pt performed supine to sit with supervision and additional time. Pt refused ambulation, performed stand pivot transfers with PFRW with minA. Pt then stating urgency to use toilet. Performed SB transfer to toilet with supervision and performed squat pivot transfer return to w/c with minA. Pt requesting to wear pants as staying in chair after session and was able to perform additional sit to stand with PFRW for pulling up pants. Pt left in w/c with chair alarm on and nsg in room.      Therapy Documentation Precautions:  Precautions Precautions: Fall Precaution Comments: CP with R spastic hemiplegia, glaucoma Restrictions Weight Bearing Restrictions: Yes RUE Weight Bearing: Weight bearing as tolerated RLE Weight Bearing: Weight bearing as tolerated Other Position/Activity Restrictions: no hip precautions General: PT Amount of Missed Time (min): 30 Minutes PT Missed Treatment Reason: Other (Comment) (pt eating) Vital Signs: Therapy Vitals Temp:  (pt reufse) Pulse Rate: 95 Resp: 16 BP: 99/64 Patient Position (if appropriate): Lying Oxygen Therapy SpO2: 97 % O2 Device: Not Delivered   See Function Navigator for Current Functional Status.   Therapy/Group: Individual Therapy  Romar Woodrick  Giulio Bertino, PTA  02/05/2017, 3:46 PM

## 2017-02-05 NOTE — Progress Notes (Signed)
Hannah Kennedy is a 31 y.o. female 11/30/1985 [id number]  Subjective: No new complaints.Reports pain controlled. Slept well. Feeling OK.  Objective: Vital signs in last 24 hours: Temp:  [97.7 F (36.5 C)] 97.7 F (36.5 C) (03/17 0435) Pulse Rate:  [101] 101 (03/17 0435) Resp:  [17] 17 (03/17 0435) BP: (111)/(70) 111/70 (03/17 0435) SpO2:  [97 %] 97 % (03/17 0435) Weight change:  Last BM Date: 02/05/17  Intake/Output from previous day: 03/16 0701 - 03/17 0700 In: 720 [P.O.:720] Out: -   Physical Exam General: No apparent distress   Friendly but simple affect and interactions; imaginative ("that stomach shot gave me a massive heart attack this morning!") Lungs: Normal effort. Lungs clear to auscultation, no crackles or wheezes. Cardiovascular: Regular rate and rhythm, no edema Wounds: Clean, dry, intact. No signs of infection.  Lab Results: BMET    Component Value Date/Time   NA 139 02/04/2017 0553   NA 135 (L) 07/12/2013 0542   K 3.9 02/04/2017 0553   K 3.6 07/12/2013 0542   CL 102 02/04/2017 0553   CL 102 07/12/2013 0542   CO2 29 02/04/2017 0553   CO2 27 07/12/2013 0542   GLUCOSE 87 02/04/2017 0553   GLUCOSE 94 07/12/2013 0542   BUN 11 02/04/2017 0553   BUN 3 (L) 07/12/2013 0542   CREATININE 0.66 02/04/2017 0553   CREATININE 0.60 07/12/2013 0542   CALCIUM 9.5 02/04/2017 0553   CALCIUM 9.8 07/12/2013 0542   GFRNONAA >60 02/04/2017 0553   GFRNONAA >60 07/12/2013 0542   GFRAA >60 02/04/2017 0553   GFRAA >60 07/12/2013 0542   CBC    Component Value Date/Time   WBC 10.1 02/04/2017 0553   RBC 4.07 02/04/2017 0553   HGB 11.6 (L) 02/04/2017 0553   HGB 12.7 07/13/2013 0410   HCT 36.7 02/04/2017 0553   HCT 36.1 07/13/2013 0410   PLT 296 02/04/2017 0553   PLT 279 07/13/2013 0410   MCV 90.2 02/04/2017 0553   MCV 83 07/13/2013 0410   MCH 28.5 02/04/2017 0553   MCHC 31.6 02/04/2017 0553   RDW 14.9 02/04/2017 0553   RDW 13.3 07/13/2013 0410   LYMPHSABS 4.2 (H)  02/02/2017 0441   LYMPHSABS 2.6 07/13/2013 0410   MONOABS 1.0 02/02/2017 0441   MONOABS 1.1 (H) 07/13/2013 0410   EOSABS 0.1 02/02/2017 0441   EOSABS 0.1 07/13/2013 0410   BASOSABS 0.1 02/02/2017 0441   BASOSABS 0.1 07/13/2013 0410   CBG's (last 3):  No results for input(s): GLUCAP in the last 72 hours. LFT's Lab Results  Component Value Date   ALT 35 01/22/2017   AST 30 01/22/2017   ALKPHOS 57 01/22/2017   BILITOT 0.4 01/22/2017    Studies/Results: No results found.  Medications:  I have reviewed the patient's current medications. Scheduled Medications: . baclofen  10 mg Oral QHS  . brimonidine  1 drop Both Eyes TID  . chlorproMAZINE  100 mg Oral BH-q7a  . enoxaparin (LOVENOX) injection  40 mg Subcutaneous Q24H  . feeding supplement (PRO-STAT SUGAR FREE 64)  30 mL Oral BID  . fluticasone  1 spray Each Nare Daily  . latanoprost  1 drop Both Eyes QHS  . lidocaine  1 patch Transdermal Q24H  . metoprolol tartrate  12.5 mg Oral BID  . pantoprazole  40 mg Oral Daily  . polyethylene glycol  17 g Oral Daily  . predniSONE  10 mg Oral Q breakfast  . senna-docusate  2 tablet Oral QHS  .  sertraline  50 mg Oral QHS  . timolol  1 drop Both Eyes Daily  . traMADol  50 mg Oral TID WC & HS  . vitamin B-12  1,000 mcg Oral Daily   PRN Medications: acetaminophen, alum & mag hydroxide-simeth, bisacodyl, clonazePAM, diphenhydrAMINE, guaiFENesin-dextromethorphan, menthol-cetylpyridinium **OR** phenol, methocarbamol, oxyCODONE, prochlorperazine **OR** prochlorperazine **OR** prochlorperazine, sodium phosphate, traZODone  Assessment/Plan: Active Problems:   Avascular necrosis of bone of hip, right (HCC)   Abnormality of gait   History of DVT (deep vein thrombosis)   Post-operative pain   Spastic hemiplegia affecting nondominant side (HCC)   Leukocytosis   Acute blood loss anemia   Glaucoma   Uveitis   Status post total replacement of right hip   Chest pain   Spastic hemiplegic  cerebral palsy (HCC)   Abnormal urinalysis   1. Debility s/p R THR for AVN; complicated by chronic spastic R HP due to CP - continue CIR and PT/OT as ongoing 2. Pain control - reports managed at this time - continue same 3. Schizophrenia, stable and controlled at present - continue same rx  Length of stay, days: 15   Sereniti Wan A. Felicity CoyerLeschber, MD 02/05/2017, 1:54 PM

## 2017-02-06 ENCOUNTER — Inpatient Hospital Stay (HOSPITAL_COMMUNITY): Payer: Medicaid Other | Admitting: Physical Therapy

## 2017-02-06 NOTE — Progress Notes (Signed)
Hannah Kennedy is a 31 y.o. female [birth year] [id number]  Subjective: No new complaints. Slept well. Denies pain and reports she feels fine.  Objective: Vital signs in last 24 hours: Temp:  [98.2 F (36.8 C)] 98.2 F (36.8 C) (03/18 0602) Pulse Rate:  [93-98] 93 (03/18 0602) Resp:  [16-18] 18 (03/18 0602) BP: (99-110)/(54-64) 110/54 (03/18 0602) SpO2:  [96 %-97 %] 96 % (03/18 0602) Weight change:  Last BM Date: 02/05/17  Intake/Output from previous day: 03/17 0701 - 03/18 0700 In: 480 [P.O.:480] Out: -   Physical Exam General: No apparent distress    Lungs: Normal effort. Lungs clear to auscultation, no crackles or wheezes. Cardiovascular: Regular rate and rhythm, no edema   Lab Results: BMET    Component Value Date/Time   NA 139 02/04/2017 0553   NA 135 (L) 07/12/2013 0542   K 3.9 02/04/2017 0553   K 3.6 07/12/2013 0542   CL 102 02/04/2017 0553   CL 102 07/12/2013 0542   CO2 29 02/04/2017 0553   CO2 27 07/12/2013 0542   GLUCOSE 87 02/04/2017 0553   GLUCOSE 94 07/12/2013 0542   BUN 11 02/04/2017 0553   BUN 3 (L) 07/12/2013 0542   CREATININE 0.66 02/04/2017 0553   CREATININE 0.60 07/12/2013 0542   CALCIUM 9.5 02/04/2017 0553   CALCIUM 9.8 07/12/2013 0542   GFRNONAA >60 02/04/2017 0553   GFRNONAA >60 07/12/2013 0542   GFRAA >60 02/04/2017 0553   GFRAA >60 07/12/2013 0542   CBC    Component Value Date/Time   WBC 10.1 02/04/2017 0553   RBC 4.07 02/04/2017 0553   HGB 11.6 (L) 02/04/2017 0553   HGB 12.7 07/13/2013 0410   HCT 36.7 02/04/2017 0553   HCT 36.1 07/13/2013 0410   PLT 296 02/04/2017 0553   PLT 279 07/13/2013 0410   MCV 90.2 02/04/2017 0553   MCV 83 07/13/2013 0410   MCH 28.5 02/04/2017 0553   MCHC 31.6 02/04/2017 0553   RDW 14.9 02/04/2017 0553   RDW 13.3 07/13/2013 0410   LYMPHSABS 4.2 (H) 02/02/2017 0441   LYMPHSABS 2.6 07/13/2013 0410   MONOABS 1.0 02/02/2017 0441   MONOABS 1.1 (H) 07/13/2013 0410   EOSABS 0.1 02/02/2017 0441   EOSABS  0.1 07/13/2013 0410   BASOSABS 0.1 02/02/2017 0441   BASOSABS 0.1 07/13/2013 0410   CBG's (last 3):  No results for input(s): GLUCAP in the last 72 hours. LFT's Lab Results  Component Value Date   ALT 35 01/22/2017   AST 30 01/22/2017   ALKPHOS 57 01/22/2017   BILITOT 0.4 01/22/2017    Studies/Results: No results found.  Medications:  I have reviewed the patient's current medications. Scheduled Medications: . baclofen  10 mg Oral QHS  . brimonidine  1 drop Both Eyes TID  . chlorproMAZINE  100 mg Oral BH-q7a  . enoxaparin (LOVENOX) injection  40 mg Subcutaneous Q24H  . feeding supplement (PRO-STAT SUGAR FREE 64)  30 mL Oral BID  . fluticasone  1 spray Each Nare Daily  . latanoprost  1 drop Both Eyes QHS  . lidocaine  1 patch Transdermal Q24H  . metoprolol tartrate  12.5 mg Oral BID  . pantoprazole  40 mg Oral Daily  . polyethylene glycol  17 g Oral Daily  . predniSONE  10 mg Oral Q breakfast  . senna-docusate  2 tablet Oral QHS  . sertraline  50 mg Oral QHS  . timolol  1 drop Both Eyes Daily  . traMADol  50  mg Oral TID WC & HS  . vitamin B-12  1,000 mcg Oral Daily   PRN Medications: acetaminophen, alum & mag hydroxide-simeth, bisacodyl, clonazePAM, diphenhydrAMINE, guaiFENesin-dextromethorphan, menthol-cetylpyridinium **OR** phenol, methocarbamol, oxyCODONE, prochlorperazine **OR** prochlorperazine **OR** prochlorperazine, sodium phosphate, traZODone  Assessment/Plan: Active Problems:   Avascular necrosis of bone of hip, right (HCC)   Abnormality of gait   History of DVT (deep vein thrombosis)   Post-operative pain   Spastic hemiplegia affecting nondominant side (HCC)   Leukocytosis   Acute blood loss anemia   Glaucoma   Uveitis   Status post total replacement of right hip   Chest pain   Spastic hemiplegic cerebral palsy (HCC)   Abnormal urinalysis   Length of stay, days: 16  1. Debility s/p R THR for AVN; complicated by chronic spastic R HP due to CP -  continue CIR and PT/OT as ongoing 2. Pain control - reports managed at this time - continue same 3. Schizophrenia, stable and controlled at present - continue same rx  Graciella Arment A. Felicity CoyerLeschber, MD 02/06/2017, 11:13 AM

## 2017-02-06 NOTE — Progress Notes (Signed)
Physical Therapy Note  Patient Details  Name: Hannah Kennedy MRN: [id number] Date of Birth: March 11, 1986 Today's Date: 02/06/2017    Time: 745-830 45 minutes  1:1 Pt with c/o Rt hip pain, RN made aware and meds given during session. Pt initially refusing to participate because "It's Sunday and we don't have therapy on Sunday".  Pt eventually agreeable to out of bed activity with encouragement.  Bed mobility rolling and supine to sit with supervision and increased time.  Sit to stand transfers multiple attempts with RW for lower body dressing with min A.  Sitting balance for upper body dressing with supervision.  Gait with PFRW x 15' with min A, improved safety with turning and backing up to sit.  Pt missed initial 15 minutes of session due to breakfast.   Jesseka Drinkard 02/06/2017, 8:31 AM

## 2017-02-07 ENCOUNTER — Inpatient Hospital Stay (HOSPITAL_COMMUNITY): Payer: Medicaid Other | Admitting: Physical Therapy

## 2017-02-07 ENCOUNTER — Inpatient Hospital Stay (HOSPITAL_COMMUNITY): Payer: Medicaid Other | Admitting: Occupational Therapy

## 2017-02-07 MED ORDER — SERTRALINE HCL 100 MG PO TABS: 50.0000 mg | ORAL_TABLET | Freq: Every day | ORAL | Status: AC

## 2017-02-07 MED ORDER — PREDNISONE 10 MG PO TABS: 10.0000 mg | ORAL_TABLET | Freq: Every day | ORAL | Status: DC

## 2017-02-07 MED ORDER — TRAMADOL HCL 50 MG PO TABS: 50.0000 mg | ORAL_TABLET | Freq: Four times a day (QID) | ORAL | Status: DC | PRN

## 2017-02-07 MED ORDER — RIVAROXABAN 10 MG PO TABS
10.0000 mg | ORAL_TABLET | Freq: Every day | ORAL | Status: DC
  Administered 2017-02-08: 10 mg via ORAL
  Filled 2017-02-07: qty 1

## 2017-02-07 MED ORDER — SENNOSIDES-DOCUSATE SODIUM 8.6-50 MG PO TABS: 2.0000 | ORAL_TABLET | Freq: Every day | ORAL | 1 refills | Status: DC

## 2017-02-07 MED ORDER — METOPROLOL TARTRATE 25 MG PO TABS: 12.5000 mg | ORAL_TABLET | Freq: Two times a day (BID) | ORAL | 0 refills | Status: DC

## 2017-02-07 MED ORDER — ACETAMINOPHEN 325 MG PO TABS: 325.0000 mg | ORAL_TABLET | ORAL | Status: DC | PRN

## 2017-02-07 MED ORDER — TRAMADOL HCL 50 MG PO TABS
50.0000 mg | ORAL_TABLET | Freq: Two times a day (BID) | ORAL | Status: DC
  Administered 2017-02-08: 50 mg via ORAL
  Filled 2017-02-07: qty 1

## 2017-02-07 MED ORDER — POLYETHYLENE GLYCOL 3350 17 G PO PACK: 17.0000 g | PACK | Freq: Every day | ORAL | 0 refills | Status: DC

## 2017-02-07 NOTE — Progress Notes (Signed)
Physical Therapy Session Note  Patient Details  Name: Hannah Kennedy MRN: [id number] Date of Birth: 1986-04-20  Today's Date: 02/07/2017 PT Individual Time: 0802-0827 and 842-920 PT Individual Time Calculation (min): 25 min and 38 min  Short Term Goals: Week 2:  PT Short Term Goal 1 (Week 2): = LTGs due to anticipated LOS  Skilled Therapeutic Interventions/Progress Updates:  Treatment 1: Pt received in bed & agreeable to treatment. Pt required max encouragement and significantly extra time to transfer supine>sitting EOB. Pt unable to tolerate HOB flat, reporting "I don't want my side to hurt". Pt unable to recall if she transferred bed>w/c via stand pivot or slide board. Encouraged pt to complete stand pivot but pt reaching for therapist for support therefore pt transferred bed>w/c via slide board. After pt transitioned to w/c pt's L thumb became caught between w/c wheel and slide board with pt reporting pain and slight bruising noted afterwards; RN made aware. Pt agreeable to continue participating in therapy when pt's correct breakfast tray arrived. Pt requesting to eat breakfast and missed 12 minutes of skilled PT treatment. Therapist did don pt's shoes total assist as pt reports "it hurts when I bend over".   Treatment 2: Therapist returned after allowing pt time to eat breakfast & pt agreeable to tx even though she had not consumed any of her meal. Transported pt to rehab gym via w/c total assist for time management. Pt ambulated 25 ft with PFRW and close supervision. Pt demonstrates decreased step length and impaired toe clearance BLE (R>L). Pt then negotiated 4 steps (6") with B rails and min assist with cuing for compensatory gait pattern. Pt with impaired eccentric control when descending steps. Pt propelled w/c 100 ft with L hemi technique and max encouragement. Throughout session pt required max encouragement for participation, as well as multiple seated rest breaks 2/2 fatigue. At end of  session pt left sitting in w/c in room with chair alarm donned and all needs within reach.   Therapy Documentation Precautions:  Precautions Precautions: Fall Precaution Comments: CP with R spastic hemiplegia, glaucoma Restrictions Weight Bearing Restrictions: Yes RUE Weight Bearing: Weight bearing as tolerated RLE Weight Bearing: Weight bearing as tolerated Other Position/Activity Restrictions: no hip precautions  General: PT Amount of Missed Time (min): 12 Minutes PT Missed Treatment Reason:  (pt requesting to eat breakfast)  Pain: c/o back & BLE pain - RN premedicated pt.  See Function Navigator for Current Functional Status.   Therapy/Group: Individual Therapy  Sandi MariscalVictoria M Trevone Prestwood 02/07/2017, 12:14 PM

## 2017-02-07 NOTE — Progress Notes (Signed)
[facility]YSICAL [facility]ICINE & REHABILITATION     PROGRESS NOTE  Subjective/Complaints:  Pt seen laying in bed this AM.  She slept well overnight.  She had a good weekend.  She is still sleepy this AM.    ROS:  Denies CP, SOB, N/V/D.  Objective: Vital Signs: Blood pressure 112/73, pulse 72, temperature 98.6 F (37 C), temperature source Oral, resp. rate 17, weight 61.1 kg (134 lb 9.6 oz), SpO2 97 %. No results found. No results for input(s): WBC, HGB, HCT, PLT in the last 72 hours. No results for input(s): NA, K, CL, GLUCOSE, BUN, CREATININE, CALCIUM in the last 72 hours.  Invalid input(s): CO CBG (last 3)  No results for input(s): GLUCAP in the last 72 hours.  Wt Readings from Last 3 Encounters:  02/04/17 61.1 kg (134 lb 9.6 oz)  01/21/17 61.2 kg (135 lb)  01/14/17 61.2 kg (135 lb)    Physical Exam:  BP 112/73 (BP Location: Left Arm)   Pulse 72   Temp 98.6 F (37 C) (Oral)   Resp 17   Wt 61.1 kg (134 lb 9.6 oz)   SpO2 97%   BMI 24.62 kg/m  Constitutional: She appears well-developed and well-nourished.  HENT: Normocephalic and atraumatic.  Eyes: EOMI. No discharge.  Cardiovascular: RRR. No JVD. Respiratory: Effort normal and breath sounds normal.  GI: Soft. Bowel sounds are normal.  Musculoskeletal: She exhibits no chest tenderness  RLE Genuvalgum Neurological: She is alert and oriented Able to follow basic commands without difficulty.  Motor:  RLE: HF 3+/5, KE 4-/5, 2/5 ADF/PF (within available ROM, stable).  RUE 4/5, mAS: 1+/4 elbow flexors, 3/4 wrist flexors, 1+/4 finger flexors  LUE: 4/5 proximal to distal LLE: HF, KE 4/5, ADF/PF 4/5  Skin: Skin is warm and dry.  Right hip incision c/d/i.  Assessment/Plan: 1. 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 Physiatrist is providing close team  supervision and 24 hour management of active medical problems listed below. Physiatrist and rehab team continue to assess barriers to discharge/monitor patient progress toward functional and medical goals.  Function:  Bathing Bathing position   Position: Shower  Bathing parts Body parts bathed by patient: Right arm, Left arm, Chest, Abdomen, Front perineal area, Buttocks, Right upper leg, Left upper leg, Right lower leg, Left lower leg Body parts bathed by helper: Right lower leg, Back, Left lower leg  Bathing assist Assist Level: Touching or steadying assistance(Pt > 75%), Supervision or verbal cues (intermittent CGA for balance) Assistive Device Comment: long handled sponge    Upper Body Dressing/Undressing Upper body dressing   What is the patient wearing?: Bra, Pull over shirt/dress Bra - Perfomed by patient: Thread/unthread right bra strap, Thread/unthread left bra strap, Hook/unhook bra (pull down sports bra) Bra - Perfomed by helper: Thread/unthread right bra strap, Thread/unthread left bra strap, Hook/unhook bra (pull down sports bra) Pull over shirt/dress - Perfomed by patient: Thread/unthread left sleeve, Put head through opening, Thread/unthread right sleeve, Pull shirt over trunk Pull over shirt/dress - Perfomed by helper: Thread/unthread right sleeve, Put head through opening, Pull shirt over trunk        Upper body assist Assist Level: Set up   Set up : To obtain clothing/put away  Lower Body Dressing/Undressing Lower body dressing   What is the patient wearing?: Pants Underwear - Performed by patient: Thread/unthread  left underwear leg Underwear - Performed by helper: Thread/unthread right underwear leg, Thread/unthread left underwear leg, Pull underwear up/down Pants- Performed by patient: Thread/unthread left pants leg, Pull pants up/down Pants- Performed by helper: Thread/unthread right pants leg Non-skid slipper socks- Performed by patient: Don/doff right sock,  Don/doff left sock Non-skid slipper socks- Performed by helper: Don/doff left sock, Don/doff right sock                  Lower body assist Assist for lower body dressing: Touching or steadying assistance (Pt > 75%) Assistive Device Comment: reacher, sock-aid    Toileting Toileting   Toileting steps completed by patient: Performs perineal hygiene Toileting steps completed by helper: Adjust clothing prior to toileting, Adjust clothing after toileting Toileting Assistive Devices: Grab bar or rail  Toileting assist Assist level: Touching or steadying assistance (Pt.75%)   Transfers Chair/bed transfer   Chair/bed transfer method: Squat pivot Chair/bed transfer assist level: Touching or steadying assistance (Pt > 75%) Chair/bed transfer assistive device: Armrests     Locomotion Ambulation     Max distance: 20 ft Assist level: Supervision or verbal cues   Wheelchair   Type: Manual Max wheelchair distance: 75 ft Assist Level: Supervision or verbal cues  Cognition Comprehension Comprehension assist level: Understands basic 90% of the time/cues < 10% of the time  Expression Expression assist level: Expresses basic 90% of the time/requires cueing < 10% of the time.  Social Interaction Social Interaction assist level: Interacts appropriately 90% of the time - Needs monitoring or encouragement for participation or interaction.  Problem Solving Problem solving assist level: Solves basic 90% of the time/requires cueing < 10% of the time  Memory Memory assist level: Recognizes or recalls 75 - 89% of the time/requires cueing 10 - 24% of the time    Medical Problem List and Plan: 1.  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  Cont CIR, plan for d/c tomorrow  Discussed possibility of Botox injections as outpt 2.  DVT Prophylaxis (with hx of DVT/PE)/Anticoagulation: Pharmaceutical:  Lovenox-  3. Pain Management: Scheduled tramadol qid to decrease use of oxycodone.   Scheduled baclofen at bedtime to help with spasticity, increased on 3/6  Lidoderm started 3/4.  Apprehension and behavioral component as well  Improving 3/16 4. Mood: LCSW to follow for evaluation and support.  5. Neuropsych: This patient is not fully capable of making decisions on her own behalf. 6. Skin/Wound Care: VAC d/ced 3/8, d/ced staples  7. Fluids/Electrolytes/Nutrition: Monitor I/Os 8. Leukocytosis: Resolved  WBCs 10.1 on 3/16  Afebrile  Cont to monitor  Urge incontinence at baseline.  Repeat UA +, Ucx multiple species, recollection remains pending 9. ABLA:   Hb 11.6 on 3/16  Cont to monitor 10. Glaucoma: Vision stable on current regimen--Timoptic, Xalatan   11. Schizophrenia: Managed on [place] and thorazine daily  12. Uveitis: Home meds resumed 13. Hypoalbuminemia  Supplement initiated 3/3   Cont to monitor 14. Chest pain, MSK  Resolved  LOS (Days) 17 A FACE TO FACE EVALUATION WAS PERFORMED  Yevonne Yokum Karis Jubanil Rayshad Riviello 02/07/2017 9:01 AM

## 2017-02-07 NOTE — [place] FL2 (Signed)
[place] MEDICAID FL2 LEVEL OF CARE SCREENING TOOL     IDENTIFICATION  Patient Name: Hannah Kennedy Birthdate: 02-01-1986 Sex: female Admission Date (Current Location): 01/21/2017  [place] and [place] Number:  [address] [id number] K Facility and Address:  [facility], [address] [place], [place] [address]      Provider Number: [id number]  Attending Physician Name and Address:  Ankit [name], MD  Relative Name and Phone Number:  Odis Luster [phone]    Current Level of Care: Other (Comment) (Rehab) Recommended Level of Care: [facility] Prior Approval Number:    Date Approved/Denied:   PASRR Number: [id number]  Discharge Plan: Domiciliary (Rest home)    Current Diagnoses: Patient Active Problem List   Diagnosis Date Noted  . Abnormal urinalysis   . Spastic hemiplegic cerebral palsy (HCC)   . Chest pain   . Abnormality of gait   . History of DVT (deep vein thrombosis)   . Post-operative pain   . Spastic hemiplegia affecting nondominant side (HCC)   . Leukocytosis   . Acute blood loss anemia   . Glaucoma   . Uveitis   . Status post total replacement of right hip   . 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 Cerebral palsy (HCC) 01/04/2017  . Schizophrenia (HCC) 01/04/2017  . Avascular necrosis of bone of hip, right (HCC) 01/04/2017  . Hx of pulmonary embolus 01/04/2017    Orientation RESPIRATION BLADDER Height & Weight     Self, Time, Situation, Place  Normal Continent Weight: 134 lb 9.6 oz (61.1 kg) Height:     BEHAVIORAL SYMPTOMS/MOOD NEUROLOGICAL BOWEL NUTRITION STATUS      Continent Diet (regular)  AMBULATORY STATUS COMMUNICATION OF NEEDS Skin   Supervision Verbally Surgical wounds (Sutures out 3/14)                       Personal Care Assistance Level of Assistance  Bathing, Dressing Bathing Assistance: Limited assistance Feeding assistance: Independent Dressing  Assistance: Limited assistance     Functional Limitations Info  Sight, Speech Sight Info: Impaired Hearing Info: Adequate Speech Info: Adequate    SPECIAL CARE FACTORS FREQUENCY  PT (By licensed PT), OT (By licensed OT)     PT Frequency: 3x week OT Frequency: 3x week            Contractures Contractures Info: Not present    Additional Factors Info  Code Status Code Status Info: Full Code Allergies Info: NKDA           Current Medications (02/07/2017):  This is the current hospital active medication list Current Facility-Administered Medications  Medication Dose Route Frequency Provider Last Rate Last Dose  . acetaminophen (TYLENOL) tablet 325-650 mg  325-650 mg Oral Q4H PRN Jacquelynn Cree, PA-C   650 mg at 02/02/17 1853  . alum & mag hydroxide-simeth (MAALOX/MYLANTA) 200-200-20 MG/5ML suspension 30 mL  30 mL Oral Q4H PRN Evlyn Kanner Love, PA-C   30 mL at 01/29/17 1345  . baclofen (LIORESAL) tablet 10 mg  10 mg Oral QHS Ankit [name], MD   10 mg at 02/06/17 2014  . bisacodyl (DULCOLAX) suppository 10 mg  10 mg Rectal Daily PRN Evlyn Kanner Love, PA-C      . brimonidine (ALPHAGAN) 0.2 % ophthalmic solution 1 drop  1 drop Both Eyes TID Jacquelynn Cree, PA-C   1 drop at 02/07/17 0806  . chlorproMAZINE (THORAZINE) tablet 100 mg  100  mg Oral BH-q7a Pamela S Love, PA-C   100 mg at 02/07/17 0815  . clonazePAM (KLONOPIN) tablet 0.5 mg  0.5 mg Oral BID PRN Jacquelynn Creeamela S Love, PA-C   0.5 mg at 02/01/17 1105  . diphenhydrAMINE (BENADRYL) 12.5 MG/5ML elixir 12.5-25 mg  12.5-25 mg Oral Q6H PRN Evlyn KannerPamela S Love, PA-C      . enoxaparin (LOVENOX) injection 40 mg  40 mg Subcutaneous Q24H Pamela S Love, PA-C   40 mg at 02/07/17 0805  . feeding supplement (PRO-STAT SUGAR FREE 64) liquid 30 mL  30 mL Oral BID Ankit Karis JubaAnil Patel, MD   30 mL at 02/07/17 0805  . fluticasone (FLONASE) 50 MCG/ACT nasal spray 1 spray  1 spray Each Nare Daily Jacquelynn Creeamela S Love, PA-C   1 spray at 02/07/17 0805  .  guaiFENesin-dextromethorphan (ROBITUSSIN DM) 100-10 MG/5ML syrup 5-10 mL  5-10 mL Oral Q6H PRN Evlyn KannerPamela S Love, PA-C      . latanoprost (XALATAN) 0.005 % ophthalmic solution 1 drop  1 drop Both Eyes QHS Jacquelynn Creeamela S Love, PA-C   1 drop at 02/03/17 2054  . lidocaine (LIDODERM) 5 % 1 patch  1 patch Transdermal Q24H Ankit Karis JubaAnil Patel, MD   1 patch at 02/06/17 1617  . menthol-cetylpyridinium (CEPACOL) lozenge 3 mg  1 lozenge Oral PRN Evlyn KannerPamela S Love, PA-C       Or  . phenol (CHLORASEPTIC) mouth spray 1 spray  1 spray Mouth/Throat PRN Jacquelynn CreePamela S Love, PA-C      . methocarbamol (ROBAXIN) tablet 500 mg  500 mg Oral Q6H PRN Jacquelynn CreePamela S Love, PA-C   500 mg at 02/01/17 2325  . metoprolol tartrate (LOPRESSOR) tablet 12.5 mg  12.5 mg Oral BID Jacquelynn Creeamela S Love, PA-C   12.5 mg at 02/07/17 0804  . oxyCODONE (Oxy IR/ROXICODONE) immediate release tablet 5-10 mg  5-10 mg Oral Q4H PRN Jacquelynn CreePamela S Love, PA-C   10 mg at 02/03/17 [id number]  . pantoprazole (PROTONIX) EC tablet 40 mg  40 mg Oral Daily Jacquelynn Creeamela S Love, PA-C   40 mg at 02/07/17 0804  . polyethylene glycol (MIRALAX / GLYCOLAX) packet 17 g  17 g Oral Daily Ankit Karis JubaAnil Patel, MD   17 g at 02/07/17 0804  . predniSONE (DELTASONE) tablet 10 mg  10 mg Oral Q breakfast Jacquelynn Creeamela S Love, PA-C   10 mg at 02/07/17 0804  . prochlorperazine (COMPAZINE) tablet 5-10 mg  5-10 mg Oral Q6H PRN Jacquelynn CreePamela S Love, PA-C       Or  . prochlorperazine (COMPAZINE) injection 5-10 mg  5-10 mg Intramuscular Q6H PRN Jacquelynn CreePamela S Love, PA-C       Or  . prochlorperazine (COMPAZINE) suppository 12.5 mg  12.5 mg Rectal Q6H PRN Jacquelynn CreePamela S Love, PA-C      . senna-docusate (Senokot-S) tablet 2 tablet  2 tablet Oral QHS Jacquelynn CreePamela S Love, PA-C   2 tablet at 02/06/17 2013  . sertraline (ZOLOFT) tablet 50 mg  50 mg Oral QHS Evlyn KannerPamela S Love, PA-C   50 mg at 02/06/17 2014  . sodium phosphate (FLEET) 7-19 GM/118ML enema 1 enema  1 enema Rectal Once PRN Jacquelynn CreePamela S Love, PA-C      . timolol (TIMOPTIC) 0.25 % ophthalmic solution 1 drop  1 drop  Both Eyes Daily Jacquelynn Creeamela S Love, PA-C   1 drop at 02/07/17 0810  . traMADol (ULTRAM) tablet 50 mg  50 mg Oral QID PRN Jacquelynn CreePamela S Love, PA-C      . traZODone (DESYREL) tablet  25-50 mg  25-50 mg Oral QHS PRN Jacquelynn Cree, PA-C   50 mg at 02/06/17 2014  . vitamin B-12 (CYANOCOBALAMIN) tablet 1,000 mcg  1,000 mcg Oral Daily Jacquelynn Cree, PA-C   1,000 mcg at 02/07/17 1610     Discharge Medications: Please see discharge summary for a list of discharge medications.  Relevant Imaging Results:  Relevant Lab Results:   Additional Information Returning to group [address]  Rowene Suto, [place], [place]

## 2017-02-07 NOTE — Progress Notes (Signed)
Occupational Therapy Discharge Summary  Patient Details  Name: Hannah Kennedy MRN: [id number] Date of Birth: 09/08/1986  Today's Date: 02/07/2017 OT Individual Time: 1000-1100 OT Individual Time Calculation (min): 60 min    Patient is able to perform  LB bathing/dressing tasks using long-handled adaptive equipment with increased time, verbal cues, and supervision/set-up A.  She can perform stand-pivot transfers with overall supervision to raised toilet and tub bench.  She has demonstrated improved sit<>stand, dynamic standing balance and activity tolerance needed to perform daily self-care tasks.   Patient has met 9 of 9 long term goals due to improved activity tolerance, improved balance and ability to compensate for deficits.  Patient to discharge at overall Supervision/Min A level.  Patient's care partner is independent to provide the necessary physical assistance at discharge.    Reasons goals not met: n/a   Recommendation:  Patient will benefit from ongoing skilled OT services in home health setting to continue to advance functional skills in the area of BADL.  Equipment: R Quarry manager, 18x16 hemi-height w/c  Reasons for discharge: treatment goals met and discharge from hospital  Patient/family agrees with progress made and goals achieved: Yes  OT Discharge Precautions/Restrictions  Precautions Precautions: Fall Precaution Comments: CP with R spastic hemiplegia, glaucoma Restrictions Weight Bearing Restrictions: Yes RUE Weight Bearing: Weight bearing as tolerated RLE Weight Bearing: Weight bearing as tolerated Other Position/Activity Restrictions: no hip precautions Pain Pain Assessment Pain Assessment: 0-10 Faces Pain Scale: Hurts a little bit Pain Type: Surgical pain Pain Location: Hip Pain Orientation: Right Pain Descriptors / Indicators: Aching Pain Onset: On-going Pain Intervention(s): Repositioned Multiple Pain Sites: No ADL ADL Equipment  Provided: Reacher, Sock aid, Long-handled shoe horn, Long-handled sponge Grooming: Setup Where Assessed-Grooming: Sitting at sink Upper Body Bathing: Setup Where Assessed-Upper Body Bathing: Sitting at sink Lower Body Bathing: Setup Where Assessed-Lower Body Bathing: Sitting at sink, Standing at sink Upper Body Dressing: Setup Where Assessed-Upper Body Dressing: Wheelchair Lower Body Dressing: Minimal assistance Where Assessed-Lower Body Dressing: Wheelchair, Standing at sink, Sitting at sink Toileting: Setup Where Assessed-Toileting: Glass blower/designer: Close supervision Toilet Transfer Method: Arts development officer: Raised toilet Print production planner: Close supervision Social research officer, government Method: Radiographer, therapeutic: Shower seat with back, Grab bars ADL Comments: Please see functional navigator Cognition Overall Cognitive Status: History of cognitive impairments - at baseline Arousal/Alertness: Awake/alert Orientation Level: Oriented X4 Sensation Sensation Light Touch: Appears Intact Motor  Motor Motor: Hemiplegia;Abnormal tone;Abnormal postural alignment and control Motor - Skilled Clinical Observations: historty of CP, Hemiplegia  Mobility  Bed Mobility Bed Mobility: Supine to Sit Supine to Sit: 5: Supervision Transfers Sit to Stand: 5: Supervision Stand to Sit: 5: Supervision  Balance Static Sitting Balance Static Sitting - Balance Support: No upper extremity supported Static Sitting - Level of Assistance: 6: Modified independent (Device/Increase time) Dynamic Sitting Balance Dynamic Sitting - Balance Support: No upper extremity supported Dynamic Sitting - Level of Assistance: 6: Modified independent (Device/Increase time) Dynamic Standing Balance Dynamic Standing - Balance Support: During functional activity Dynamic Standing - Level of Assistance: 5: Stand by assistance Dynamic Standing - Balance Activities:  Reaching for objects Extremity/Trunk Assessment RUE Assessment RUE Assessment: Exceptions to [facility] (hypertonicity at elbow, wrist, and finger joints; swan neck ) LUE Assessment LUE Assessment: Within Functional Limits   See Function Navigator for Current Functional Status.  Daneen Schick Amman Bartel 02/07/2017, 12:57 PM

## 2017-02-07 NOTE — Progress Notes (Signed)
Physical Therapy Discharge Summary  Patient Details  Name: Hannah Kennedy MRN: [id number] Date of Birth: 02/29/1986  Today's Date: 02/07/2017 PT Individual Time: 1430-1530 PT Individual Time Calculation (min): 60 min    Patient has met 10 of 10 long term goals due to improved activity tolerance, improved balance, improved postural control, increased strength, increased range of motion, decreased pain, ability to compensate for deficits, functional use of  right upper extremity and right lower extremity, improved attention, improved awareness and improved coordination.  Patient to discharge at w/c level for community, platform RW for transfers level Supervision.   Patient to d/c back to group home.   Recommendation:  Patient will benefit from ongoing skilled PT services in home health setting to continue to advance safe functional mobility, address ongoing impairments in balance, coordination, and strength, and minimize fall risk.  Equipment: platform RW and w/c  Reasons for discharge: treatment goals met and discharge from hospital  Patient/family agrees with progress made and goals achieved: Yes   Skilled Therapeutic Intervention: No c/o pain at rest but occasional grimacing with R hip movement.  Session focus on d/c assessment, pt education, transfers, ambulation, and activity tolerance.   Pt propels w/c x100' with L hemi-technique and encouragement to increase distance.  Car transfer with platform RW and supervision.  Pt able to lift BLEs in/out of car without assist.  Gait training x25' with platform RW and supervision with increased time.  Attempted to adjust pt's new platform RW, however it is too short (pediatric size?); will inform SW.  Pt returned to room at end of session and positioned in w/c with call bell in reach and needs met.   PT Discharge Precautions/Restrictions Precautions Precautions: Fall Precaution Comments: CP with R spastic hemiplegia,  glaucoma Restrictions Weight Bearing Restrictions: Yes RUE Weight Bearing: Weight bearing as tolerated RLE Weight Bearing: Weight bearing as tolerated Other Position/Activity Restrictions: no hip precautions  Cognition Overall Cognitive Status: History of cognitive impairments - at baseline Arousal/Alertness: Awake/alert Orientation Level: Oriented X4 Sustained Attention: Appears intact Safety/Judgment: Appears intact Sensation Sensation Light Touch: Appears Intact Coordination Gross Motor Movements are Fluid and Coordinated: No Fine Motor Movements are Fluid and Coordinated: No Coordination and Movement Description: spastic hemiplegia at baseline (R) Motor  Motor Motor: Hemiplegia;Abnormal tone;Abnormal postural alignment and control Motor - Discharge Observations: spastic hemiplegia (R) at baseline  Mobility Bed Mobility Bed Mobility: Supine to Sit Supine to Sit: 5: Supervision Transfers Transfers: Yes Sit to Stand: 5: Supervision Stand to Sit: 5: Supervision Stand Pivot Transfers: 5: Supervision Stand Pivot Transfer Details (indicate cue type and reason): with platform RW Lateral/Scoot Transfers: With slide board;5: Supervision (total assist for slide board placement) Locomotion  Ambulation Ambulation: Yes Ambulation/Gait Assistance: 5: Supervision Assistive device: Right platform walker Gait Gait Pattern: Decreased step length - left;Decreased step length - right;Decreased dorsiflexion - right;Decreased dorsiflexion - left (decreased gait speed) Stairs / Additional Locomotion Stairs: Yes Stairs Assistance: 4: Min assist Stairs Assistance Details:  (verbal cuing for compensatory pattern) Stair Management Technique: Two rails Number of Stairs: 4 Height of Stairs:  (6 inches) Wheelchair Mobility Wheelchair Mobility: Yes Wheelchair Assistance: 5: Supervision (L hemi technique) Wheelchair Propulsion: Left upper extremity;Left lower extremity Distance: 125   Trunk/Postural Assessment  Cervical Assessment Cervical Assessment: Within Functional Limits Thoracic Assessment Thoracic Assessment: Exceptions to [facility] (mild kyphosis) Lumbar Assessment Lumbar Assessment: Exceptions to [facility] (posterior pelvic tilt) Postural Control Postural Control: Within Functional Limits  Balance Balance Balance Assessed: Yes Static Sitting Balance Static Sitting -  Balance Support: No upper extremity supported;Feet supported Static Sitting - Level of Assistance: 6: Modified independent (Device/Increase time) Dynamic Sitting Balance Dynamic Sitting - Balance Support: No upper extremity supported Dynamic Sitting - Level of Assistance: 6: Modified independent (Device/Increase time) Static Standing Balance Static Standing - Balance Support: Bilateral upper extremity supported Static Standing - Level of Assistance: 5: Stand by assistance Dynamic Standing Balance Dynamic Standing - Balance Support: During functional activity Dynamic Standing - Level of Assistance: 5: Stand by assistance Extremity Assessment      RLE Assessment RLE Assessment: Exceptions to [facility] RLE AROM (degrees) RLE Overall AROM Comments: full, painful ROM RLE Strength RLE Overall Strength Comments: at least 3/5 LLE Assessment LLE Assessment: Within Functional Limits   See Function Navigator for Current Functional Status.  Rue Tinnel E Penven-Crew 02/07/2017, 3:08 PM

## 2017-02-07 NOTE — Discharge Summary (Signed)
Physician Discharge Summary  Patient ID: Hannah Kennedy MRN: [id number] DOB/AGE: [birth year] 33 y.o.  Admit date: 01/21/2017 Discharge date: 02/08/2017  Discharge Diagnoses:  Principal Problem:   Avascular necrosis of bone of hip, right (HCC) Active Problems:   Abnormality of gait   History of DVT (deep vein thrombosis)   Post-operative pain   Leukocytosis   Acute blood loss anemia   Glaucoma   Uveitis   Status post total replacement of right hip   Spastic hemiplegic cerebral palsy (HCC)   Discharged Condition: stable   Significant Diagnostic Studies: Dg Hip Operative Unilat W Or W/o Pelvis Right  Result Date: 01/18/2017 CLINICAL DATA:  Right hip replacement EXAM: OPERATIVE right HIP (WITH PELVIS IF PERFORMED) 7 VIEWS TECHNIQUE: Fluoroscopic spot image(s) were submitted for interpretation post-operatively. COMPARISON:  MR pelvis of 12/20/2016 FINDINGS: C-arm spot films were obtained showing changes of avascular necrosis involving the right femoral head. The components of the right hip replacement appear to be in good position with no complicating features noted. IMPRESSION: Right hip replacement performed without complicating features. The entire femoral stem is not included on the images obtained. Electronically Signed   By: Hannah Kennedy M.D.   On: 01/18/2017 14:32   Dg Hip Unilat W Or W/o Pelvis 2-3 Views Right  Result Date: 01/18/2017 CLINICAL DATA:  Right hip replacement . EXAM: DG HIP (WITH OR WITHOUT PELVIS) 2-3V RIGHT COMPARISON:  MRI 01/29/ 2018 . FINDINGS: Total right hip replacement. Hardware intact. Anatomic alignment. No acute bony abnormality. IMPRESSION: Total right hip replacement.  Anatomic alignment . Electronically Signed   By: Hannah Kennedy  Kennedy   On: 01/18/2017 15:27    Labs:  Basic Metabolic Panel:  Recent Labs Lab 02/02/17 0441 02/04/17 0553  NA 136 139  K 4.0 3.9  CL 101 102  CO2 27 29  GLUCOSE 92 87  BUN 11 11  CREATININE 0.71 0.66  CALCIUM 9.2 9.5     CBC:  Recent Labs Lab 02/02/17 0441 02/04/17 0553  WBC 12.1* 10.1  NEUTROABS 6.7  --   HGB 11.1* 11.6*  HCT 35.3* 36.7  MCV 88.9 90.2  PLT 322 296    CBG: No results for input(s): GLUCAP in the last 168 hours.  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 History taken from chart review. Patient/family elected on surgical intervention and temporary IVC filter placed prior to admission. She was admitted to [facility]18[facility]17 for anterior R-THR by Dr. Rosita Kennedy. Post op limited by increase in tone as well as pain. Noted to have hypotension post procedure as well as leucocytosis. Therapy ongoing and patient showing improvement in activity tolerance. CIR recommended for follow up therapy.    Hospital Course: Hannah Kennedy was admitted to rehab 01/21/2017 for inpatient therapies to consist of PT and OT at least three hours five days a week. Past admission physiatrist, therapy team and rehab RN have worked together to provide customized collaborative inpatient rehab.  Blood pressures have been stable and she has been afebrile during her stay. Follow up CBC shows that ABLA is improving and leucocytosis has resolved. UA/UCS done past admission for workup and has been negative.  Right hip incision is healing well without signs or symptoms of infection. Pain has been managed with use of ultram qid and baclofen was added at bedtime to help manage right  spasticity.  Pain right hip has improved and oxycodone was discontinued and ultram has been weaned to bid. Mood has been reasonably stable and she has required occasional encouragement to work through pain and discomfort. She has made good progress and is currently at min assist to supervision level. She will continue to receive follow up HHPT and HHOT by  [facility] after discharge.    Rehab  course: During patient's stay in rehab weekly team conferences were held to monitor patient's progress, set goals and discuss barriers to discharge. At admission, patient required max assist with basic self care tasks and mobility. She has had improvement in activity tolerance, balance, postural control, as well as ability to compensate for deficits. She is able to complete bathing and UB dressing with use of AE, increased time with verbal cues after set up assist and with supervision. She requires min assist for LB dressing.  She is able to perform transfers with supervision and is ambulating  7025' with R- PFRW with supervision.  She requires min assist to climb 4 stairs.     Disposition: Home  Diet: Regular.   Wound Care: Wash incision with soap and water. Pat dry. Contact MD if you develop any problems with your incision/wound--redness, swelling, increase in pain, drainage or if you develop fever or chills.    Special Instructions: 1. Use walker when ambulating. 2. Use tramadol bid for a week then taper to bid prn for pain. May use tylenol prn for mild pain.    Discharge Instructions    Ambulatory referral to Physical Medicine Rehab    Complete by:  As directed    3-4 weeks follow up post THR     Allergies as of 02/08/2017      Reactions   Sulfamethoxazole-trimethoprim Other (See Comments)   Other reaction(s): GI upset      Medication List    STOP taking these medications   DYNA-HEX 2 EX   ibuprofen 600 MG tablet Commonly known as:  ADVIL,MOTRIN   oxyCODONE 5 MG immediate release tablet Commonly known as:  Oxy IR/ROXICODONE   STOOL SOFTENER PO     TAKE these medications   acetaminophen 325 MG tablet Commonly known as:  TYLENOL Take 1-2 tablets (325-650 mg total) by mouth every 4 (four) hours as needed for mild pain.   baclofen 10 MG tablet Commonly known as:  LIORESAL Take 1 tablet (10 mg total) by mouth at bedtime.   brimonidine 0.2 % ophthalmic solution Commonly  known as:  ALPHAGAN Place 1 drop into both eyes 3 (three) times daily.   chlorproMAZINE 50 MG tablet Commonly known as:  THORAZINE Take 100 mg by mouth every morning.   fluticasone 50 MCG/ACT nasal spray Commonly known as:  FLONASE Place 2 sprays into both nostrils daily.   folic acid 1 MG tablet Commonly known as:  FOLVITE Take 1 mg by mouth daily.   HUMIRA 40 MG/0.8ML Pskt Generic drug:  Adalimumab Inject 40 mg into the skin every 14 (fourteen) days.   INVEGA TRINZA IM Inject 490 mg into the muscle every 3 (three) months.   latanoprost 0.005 % ophthalmic solution Commonly known as:  XALATAN Place 1 drop into both eyes at bedtime.   LOTRIMIN AF 2 % Aero Generic drug:  Miconazole Nitrate Apply 1 application topically daily   metoprolol tartrate 25 MG tablet Commonly known as:  LOPRESSOR Take 0.5 tablets (12.5 mg total) by mouth 2 (two) times daily.   MIRENA (52 MG) IU  1 application by Intrauterine route once.   omeprazole 20 MG capsule Commonly known as:  PRILOSEC Take 20 mg by mouth daily.   PANOXYL WASH EX Apply 1 Dose topically daily as needed (facial).   polyethylene glycol packet Commonly known as:  MIRALAX / GLYCOLAX Take 17 g by mouth daily.   predniSONE 10 MG tablet Commonly known as:  DELTASONE Take 1 tablet (10 mg total) by mouth daily with breakfast. What changed:  how much to take  when to take this   rivaroxaban 10 MG Tabs tablet Commonly known as:  XARELTO Take 1 tablet (10 mg total) by mouth daily.   senna-docusate 8.6-50 MG tablet Commonly known as:  Senokot-S Take 2 tablets by mouth at bedtime.   sertraline 100 MG tablet Commonly known as:  ZOLOFT Take 0.5 tablets (50 mg total) by mouth daily. What changed:  how much to take   timolol 0.25 % ophthalmic solution Commonly known as:  TIMOPTIC Place 1 drop into both eyes.   traMADol 50 MG tablet--Rx # 30 pills.  Commonly known as:  ULTRAM Take 1 tablet (50 mg total) by mouth 2  (two) times daily. Notes to patient:  Take one pill twice a day for a week. Then decrease to 1/2 to one pill twice a day as needed for hip pain. Can use tylenol instead if pain is mild.    vitamin B-12 1000 MCG tablet Commonly known as:  CYANOCOBALAMIN Take 1,000 mcg by mouth daily.      Follow-up Information    Ankit [name], MD Follow up.   Specialty:  Physical Medicine and Rehabilitation Why:  office will call you with follow up appointment Contact information: [address] [place] [address] [phone]        MENZ,MICHAEL, MD Follow up on 03/02/2017.   Specialty:  Orthopedic Surgery Why:  Appointment at 9:45 am Contact information: [address] [facility] [place] [address] [phone]        Jerl Mina, MD Follow up.   Specialty:  Family Medicine Why:  Will contact [facility] to set up follow up appointment Contact information: [address] [facility] [place] [address] [phone]           Signed: Jacquelynn Cree 02/08/2017, 10:24 AM

## 2017-02-08 MED ORDER — RIVAROXABAN 10 MG PO TABS
10.0000 mg | ORAL_TABLET | Freq: Every day | ORAL | 0 refills | Status: DC
Start: 2017-02-08 — End: 2017-05-31

## 2017-02-08 MED ORDER — TRAMADOL HCL 50 MG PO TABS: 50.0000 mg | ORAL_TABLET | Freq: Two times a day (BID) | ORAL | 0 refills | Status: DC

## 2017-02-08 MED ORDER — BACLOFEN 10 MG PO TABS: 10.0000 mg | ORAL_TABLET | Freq: Every day | ORAL | 1 refills | Status: DC

## 2017-02-08 NOTE — Progress Notes (Signed)
Patient and caregiver received discharge instructions from Marissa NestlePam Love, PA-C with verbal understanding. Patient discharged with caregiver and patient belongings. Patient also discharged with patient equipment and all questions answered.

## 2017-02-08 NOTE — Progress Notes (Signed)
PHYSICAL MEDICINE & REHABILITATION     PROGRESS NOTE  Subjective/Complaints:  Pt seen laying in bed this AM.  She is eager to return to the group home.  He has questions about spasticity.   ROS:  Denies CP, SOB, N/V/D.  Objective: Vital Signs: Blood pressure (!) 103/56, pulse 80, temperature 98.2 F (36.8 C), temperature source Oral, resp. rate 16, weight 61.1 kg (134 lb 9.6 oz), SpO2 96 %. No results found. No results for input(s): WBC, HGB, HCT, PLT in the last 72 hours. No results for input(s): NA, K, CL, GLUCOSE, BUN, CREATININE, CALCIUM in the last 72 hours.  Invalid input(s): CO CBG (last 3)  No results for input(s): GLUCAP in the last 72 hours.  Wt Readings from Last 3 Encounters:  02/04/17 61.1 kg (134 lb 9.6 oz)  01/21/17 61.2 kg (135 lb)  01/14/17 61.2 kg (135 lb)    Physical Exam:  BP (!) 103/56 (BP Location: Left Arm)   Pulse 80   Temp 98.2 F (36.8 C) (Oral)   Resp 16   Wt 61.1 kg (134 lb 9.6 oz)   SpO2 96%   BMI 24.62 kg/m  Constitutional: She appears well-developed and well-nourished.  HENT: Normocephalic and atraumatic.  Eyes: EOMI. No discharge.  Cardiovascular: RRR. No JVD. Respiratory: Effort normal and breath sounds normal.  GI: Soft. Bowel sounds are normal.  Musculoskeletal: She exhibits no chest tenderness  RLE Genuvalgum Neurological: She is alert and oriented Able to follow basic commands without difficulty.  Motor:  RLE: HF 3+/5, KE 4-/5, 2/5 ADF/PF (within available ROM, improved).  RUE 4/5, mAS: 1+/4 elbow flexors, 3/4 wrist flexors, 1+/4 finger flexors  LUE: 4/5 proximal to distal LLE: HF, KE 4/5, ADF/PF 4/5  Skin: Skin is warm and dry.  Right hip incision c/d/i.  Assessment/Plan: 1. 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 Physiatrist is providing  close team supervision and 24 hour management of active medical problems listed below. Physiatrist and rehab team continue to assess barriers to discharge/monitor patient progress toward functional and medical goals.  Function:  Bathing Bathing position   Position: Wheelchair/chair at sink  Bathing parts Body parts bathed by patient: Right arm, Left arm, Chest, Abdomen, Front perineal area, Buttocks, Right upper leg, Left upper leg, Right lower leg, Left lower leg Body parts bathed by helper: Right lower leg, Back, Left lower leg  Bathing assist Assist Level: Supervision or verbal cues Assistive Device Comment: long handled sponge    Upper Body Dressing/Undressing Upper body dressing   What is the patient wearing?: Bra, Pull over shirt/dress Bra - Perfomed by patient: Thread/unthread right bra strap, Thread/unthread left bra strap, Hook/unhook bra (pull down sports bra) Bra - Perfomed by helper: Thread/unthread right bra strap, Thread/unthread left bra strap, Hook/unhook bra (pull down sports bra) Pull over shirt/dress - Perfomed by patient: Thread/unthread right sleeve, Thread/unthread left sleeve, Put head through opening, Pull shirt over trunk Pull over shirt/dress - Perfomed by helper: Thread/unthread right sleeve, Put head through opening, Pull shirt over trunk        Upper body assist Assist Level: Set up   Set up : To obtain clothing/put away  Lower Body Dressing/Undressing Lower body dressing   What is the patient wearing?: Pants, Shoes, Underwear Underwear - Performed by patient: Thread/unthread right underwear leg, Thread/unthread left underwear leg,  Pull underwear up/down Underwear - Performed by helper: Thread/unthread right underwear leg, Thread/unthread left underwear leg, Pull underwear up/down Pants- Performed by patient: Thread/unthread right pants leg, Thread/unthread left pants leg, Pull pants up/down Pants- Performed by helper: Thread/unthread right pants  leg Non-skid slipper socks- Performed by patient: Don/doff right sock, Don/doff left sock Non-skid slipper socks- Performed by helper: Don/doff left sock, Don/doff right sock     Shoes - Performed by patient: Fasten left, Don/doff left shoe, Don/doff right shoe, Fasten right            Lower body assist Assist for lower body dressing: Supervision or verbal cues Assistive Device Comment: Reacher, shoe-horn, sock-aid    Interior and spatial designer   Toileting steps completed by patient: Performs perineal hygiene, Adjust clothing prior to toileting, Adjust clothing after toileting Toileting steps completed by helper: Adjust clothing prior to toileting, Adjust clothing after toileting Toileting Assistive Devices: Grab bar or rail  Toileting assist Assist level: Touching or steadying assistance (Pt.75%)   Transfers Chair/bed transfer   Chair/bed transfer method: Stand pivot Chair/bed transfer assist level: Supervision or verbal cues Chair/bed transfer assistive device: Armrests, Patent attorney     Max distance: 25 ft Assist level: Supervision or verbal cues   Wheelchair   Type: Manual Max wheelchair distance: 100 ft Assist Level: Supervision or verbal cues  Cognition Comprehension Comprehension assist level: Follows basic conversation/direction with no assist  Expression Expression assist level: Expresses basic needs/ideas: With no assist  Social Interaction Social Interaction assist level: Interacts appropriately 90% of the time - Needs monitoring or encouragement for participation or interaction.  Problem Solving Problem solving assist level: Solves basic problems with no assist  Memory Memory assist level: Recognizes or recalls 90% of the time/requires cueing < 10% of the time    Medical Problem List and Plan: 1.  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  D/c  today.  Will see patient in 1 month for hospital follow up.  Discussed possibility of Botox injections as outpt 2.  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 Pain Management: Scheduled tramadol qid to decrease use of oxycodone.   Scheduled baclofen at bedtime to help with spasticity, increased on 3/6  Lidoderm started 3/4.  Apprehension and behavioral component as well  Improved 4. Mood: LCSW to follow for evaluation and support.  5. Neuropsych: This patient is not fully capable of making decisions on her own behalf. 6. Skin/Wound Care: VAC d/ced 3/8, d/ced staples  7. Fluids/Electrolytes/Nutrition: Monitor I/Os 8. Leukocytosis: Resolved  WBCs 10.1 on 3/16  Afebrile  Cont to monitor  Urge incontinence at baseline.  Repeat UA +, Ucx multiple species, recollection remains pending 9. ABLA:   Hb 11.6 on 3/16  Cont to monitor 10. Glaucoma: Vision stable on current regimen--Timoptic, Xalatan   11. Schizophrenia: Managed on [place] and thorazine daily  12. Uveitis: Home meds resumed 13. Hypoalbuminemia  Supplement initiated 3/3   Cont to monitor 14. Chest pain, MSK  Resolved  LOS (Days) 18 A FACE TO FACE EVALUATION WAS PERFORMED  Cutberto Winfree [name] 02/08/2017 9:48 AM

## 2017-02-08 NOTE — Discharge Instructions (Addendum)
Inpatient Rehab Discharge Instructions  Hannah Kennedy Discharge date and time:  02/08/17  Activities/Precautions/ Functional Status: Activity: no lifting, driving, or strenuous exercise for till cleared by MD Diet: regular diet Wound Care: Wash incision with soap and water. Pat dry. Contact MD if you develop any problems with your incision/wound--redness, swelling, increase in pain, drainage or if you develop fever or chills.    Functional status:  ___ No restrictions     ___ Walk up steps independently _X__ 24/7 supervision/assistance   ___ Walk up steps with assistance ___ Intermittent supervision/assistance  ___ Bathe/dress independently _X__ Walk with walker    _X__ Bathe/dress with assistance ___ Walk Independently    ___ Shower independently ___ Walk with assistance    ___ Shower with assistance _X__ No alcohol     ___ Return to work/school ________   Special Instructions: 1. Continue to use tramadol twice a day for a week. Then decrease to twice a day as needed. Can use tylenol 650 mg 3-4 times a day for pain control. Can also use ice on hip on and off for pain control. Hip will continue to feel better slowly over next few weeks.    COMMUNITY REFERRALS UPON DISCHARGE:    Home Health:   PT & OT  Agency:[facility] Phone:[phone]   Date of last service:02/07/2017  Medical Equipment/Items Ordered:RIGHT Shelle Iron  Agency/Supplier:[facility]   [id number]   My questions have been answered and I understand these instructions. I will adhere to these goals and the provided educational materials after my discharge from the hospital.  Patient/Caregiver Signature _______________________________ Date __________  Clinician Signature _______________________________________ Date __________  Please bring this form and your medication list with you to all your follow-up doctor's appointments.   Information on my medicine - XARELTO (Rivaroxaban)  This  medication education was reviewed with me or my healthcare representative as part of my discharge preparation.   Why was Xarelto prescribed for you? Xarelto was prescribed for you to reduce the risk of blood clots forming after orthopedic surgery. The medical term for these abnormal blood clots is venous thromboembolism (VTE).  What do you need to know about xarelto ? Take your Xarelto 10 mg ONCE DAILY at the same time every day. You may take it either with or without food.  If you have difficulty swallowing the tablet whole, you may crush it and mix in applesauce just prior to taking your dose.  Take Xarelto exactly as prescribed by your doctor and DO NOT stop taking Xarelto without talking to the doctor who prescribed the medication.  Stopping without other VTE prevention medication to take the place of Xarelto may increase your risk of developing a clot.  After discharge, you should have regular check-up appointments with your healthcare provider that is prescribing your Xarelto.    What do you do if you miss a dose? If you miss a dose, take it as soon as you remember on the same day then continue your regularly scheduled once daily regimen the next day. Do not take two doses of Xarelto on the same day.   Important Safety Information A possible side effect of Xarelto is bleeding. You should call your healthcare provider right away if you experience any of the following: ? Bleeding from an injury or your nose that does not stop. ? Unusual colored urine (red or dark brown) or unusual colored stools (red or black). ? Unusual bruising for unknown reasons. ? A serious fall or  if you hit your head (even if there is no bleeding).  Some medicines may interact with Xarelto and might increase your risk of bleeding while on Xarelto. To help avoid this, consult your healthcare provider or pharmacist prior to using any new prescription or non-prescription medications, including herbals,  vitamins, non-steroidal anti-inflammatory drugs (NSAIDs) and supplements.  This website has more information on Xarelto: [email or web address].xarelto.[email or web address].

## 2017-02-08 NOTE — Progress Notes (Signed)
Social Work  Discharge Note  The overall goal for the admission was met for:   Discharge location: [place]  Length of Stay: Yes-18 DAYS  Discharge activity level: Yes-SUPERVISION-SOME MIN WITH BATHING AND DRESSING  Home/community participation: Yes  Services provided included: MD, RD, PT, OT, RN, CM, TR, Pharmacy and SW  Financial Services: Medicaid  Follow-up services arranged: Home Health: [facility]-PT & OT, DME: [place] and Patient/Family has no preference for HH/DME agencies  Comments (or additional information):GRANDMOTHER WAS HERE TO OBSERVE AND PARTICIPATE IN THERAPIES. RON FEELS READY TO TAKE BACK TO GROUP HOME. THEY CAN PROVIDE ASSIST TO PT.  Patient/Family verbalized understanding of follow-up arrangements: Yes  Individual responsible for coordination of the follow-up plan: CHRISTINE-GRANDMOTHER & RON-GROUP HOME ADMINSTRATOR  Confirmed correct DME delivered: Elease Hashimoto 02/08/2017    Elease Hashimoto

## 2017-02-28 ENCOUNTER — Other Ambulatory Visit: Payer: Self-pay | Admitting: Physical Medicine and Rehabilitation

## 2017-03-04 ENCOUNTER — Other Ambulatory Visit: Payer: Self-pay

## 2017-03-04 NOTE — Telephone Encounter (Signed)
Amada Jupiter from [facility] is requesting medication refill for patient Lopressor, Xarelto, and also Baclofen. Patients has an upcoming appointment schedule for 03/18/2017. Is it okay to refill mediations?  Please advise

## 2017-03-04 NOTE — Telephone Encounter (Signed)
She needs to receive refills, except for Baclofen, from her PCP. She should have seen him/her at this point.  We may refill the Baclofen.  Thanks.

## 2017-03-07 ENCOUNTER — Other Ambulatory Visit: Payer: Self-pay

## 2017-03-07 MED ORDER — BACLOFEN 10 MG PO TABS: 10.0000 mg | ORAL_TABLET | Freq: Every day | ORAL | 1 refills | Status: DC

## 2017-03-07 NOTE — Telephone Encounter (Signed)
Prescription refill for baclofen printed by mistake, called into pharmacy.

## 2017-03-08 ENCOUNTER — Telehealth (INDEPENDENT_AMBULATORY_CARE_PROVIDER_SITE_OTHER): Payer: Self-pay | Admitting: Vascular Surgery

## 2017-03-08 ENCOUNTER — Other Ambulatory Visit: Payer: Self-pay | Admitting: Physical Medicine and Rehabilitation

## 2017-03-08 NOTE — Telephone Encounter (Signed)
Inocencio Homes called and said that patient is down to her last 3 Xarelto. She wants to know if patient is supposed to continue and if so order a refill. Her number is [phone]

## 2017-03-08 NOTE — Telephone Encounter (Signed)
Should a refill be called into her pharmacy. See below

## 2017-03-08 NOTE — Telephone Encounter (Signed)
Hannah Kennedy per Dr. Wyn Quaker that the patient should stay on the Xarelto until her IVC filter is removed and to call her pharmacy for a refill.

## 2017-03-10 ENCOUNTER — Inpatient Hospital Stay: Payer: Medicaid Other | Admitting: Physical Medicine & Rehabilitation

## 2017-03-18 ENCOUNTER — Encounter: Payer: Self-pay | Admitting: Physical Medicine & Rehabilitation

## 2017-03-18 ENCOUNTER — Encounter: Payer: Medicaid Other | Attending: Physical Medicine & Rehabilitation | Admitting: Physical Medicine & Rehabilitation

## 2017-03-18 VITALS — BP 105/72 | HR 86 | Resp 14

## 2017-03-18 DIAGNOSIS — F209 Schizophrenia, unspecified: Secondary | ICD-10-CM | POA: Insufficient documentation

## 2017-03-18 DIAGNOSIS — H409 Unspecified glaucoma: Secondary | ICD-10-CM | POA: Insufficient documentation

## 2017-03-18 DIAGNOSIS — M87051 Idiopathic aseptic necrosis of right femur: Secondary | ICD-10-CM | POA: Insufficient documentation

## 2017-03-18 DIAGNOSIS — G8918 Other acute postprocedural pain: Secondary | ICD-10-CM | POA: Diagnosis not present

## 2017-03-18 DIAGNOSIS — F329 Major depressive disorder, single episode, unspecified: Secondary | ICD-10-CM | POA: Diagnosis not present

## 2017-03-18 DIAGNOSIS — Z9889 Other specified postprocedural states: Secondary | ICD-10-CM | POA: Insufficient documentation

## 2017-03-18 DIAGNOSIS — G809 Cerebral palsy, unspecified: Secondary | ICD-10-CM | POA: Diagnosis not present

## 2017-03-18 DIAGNOSIS — Z96641 Presence of right artificial hip joint: Secondary | ICD-10-CM | POA: Insufficient documentation

## 2017-03-18 DIAGNOSIS — G8110 Spastic hemiplegia affecting unspecified side: Secondary | ICD-10-CM | POA: Diagnosis not present

## 2017-03-18 DIAGNOSIS — K219 Gastro-esophageal reflux disease without esophagitis: Secondary | ICD-10-CM | POA: Insufficient documentation

## 2017-03-18 DIAGNOSIS — G2119 Other drug induced secondary parkinsonism: Secondary | ICD-10-CM | POA: Insufficient documentation

## 2017-03-18 DIAGNOSIS — R269 Unspecified abnormalities of gait and mobility: Secondary | ICD-10-CM

## 2017-03-18 DIAGNOSIS — G802 Spastic hemiplegic cerebral palsy: Secondary | ICD-10-CM | POA: Diagnosis not present

## 2017-03-18 NOTE — Progress Notes (Signed)
Subjective:    Patient ID: Hannah Kennedy, female    DOB: 04-26-1986, 31 y.o.   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  Caregiver provides majority of history. She is staying at the group home during week and grand mother's today.  She presents for hospital follow up.  Since last visit, she saw Ortho and was discharged. She saw PCP as well. She is minimal pain. She completed PT and is no longer using assistive device.  Denies falls.   Pain Inventory Average Pain 0 Pain Right Now 0 My pain is no pain  In the last 24 hours, has pain interfered with the following? General activity 0 Relation with others 0 Enjoyment of life 0 What TIME of day is your pain at its worst? no pain Sleep (in general) Good  Pain is worse with: no pain Pain improves with: no pain Relief from Meds: no pain  Mobility walk without assistance ability to climb steps?  yes  Function disabled: date disabled .  Neuro/Psych trouble walking confusion  Prior Studies hospital follow up  Physicians involved in your care hospital follow up   Family History  Problem Relation Age of Onset  . Family history unknown: Yes   Social History   Social History  . Marital status: Single    Spouse name: N/A  . Number of children: N/A  . Years of education: N/A   Social History Main Topics  . Smoking status: Never Smoker  . Smokeless tobacco: Never Used  . Alcohol use No  . Drug use: No  . Sexual activity: Not Asked   Other Topics Concern  . None   Social History Narrative  . None   Past Surgical History:  Procedure Laterality Date  . CSF SHUNT    . IVC FILTER INSERTION    . IVC FILTER INSERTION N/A 01/13/2017   Procedure: IVC Filter Insertion;  Surgeon: Annice Needy, MD;  Location: [facility];  Service: Cardiovascular;  Laterality: N/A;  . TOTAL HIP ARTHROPLASTY Right  01/18/2017   Procedure: TOTAL HIP ARTHROPLASTY ANTERIOR APPROACH;  Surgeon: Kennedy Bucker, MD;  Location: [facility] ORS;  Service: Orthopedics;  Laterality: Right;   Past Medical History:  Diagnosis Date  . Arthritis   . Cerebral palsy (HCC)    with hydrocephalus  . Chronic anterior uveitis   . Depression   . GERD (gastroesophageal reflux disease)   . Glaucoma   . Hemiparesis (HCC)    right side  . 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 Pulmonary emboli (HCC)   . Schizophrenia (HCC)   . Vitamin B 12 deficiency    BP 105/72   Pulse 86   Resp 14   SpO2 95%   Opioid Risk Score:   Fall Risk Score:  `1  Depression screen PHQ 2/9  No flowsheet data found.  Review of Systems  Constitutional: Negative.   HENT: Negative.   Eyes: Negative.   Respiratory: Negative.   Gastrointestinal: Negative.   Endocrine: Negative.   Genitourinary: Positive for urgency.  Musculoskeletal: Positive for gait problem.  Skin: Negative.   Allergic/Immunologic: Negative.   Neurological: Positive for speech difficulty.  Hematological: Negative.   Psychiatric/Behavioral: Positive for confusion.      Objective:   Physical Exam Constitutional: She appears well-developed and well-nourished.  HENT: Normocephalic and atraumatic.  Eyes: EOMI. No discharge.  Cardiovascular: RRR.  No JVD. Respiratory: Effort normal and breath sounds normal.  GI: Soft. Bowel sounds are normal.  Musculoskeletal: No edema. No tenderness. Scissoring gait.  RLE Genuvalgum Neurological: She is alert and oriented Motor:  RLE: HF 4/5, KE 4/5, 2/5 ADF/PF (within available ROM, improved).  RUE 4/5 within available range mAS: 1+/4 elbow flexors, 3/4 wrist flexors, 1+/4 finger flexors  LUE: 4+/5 proximal to distal LLE: HF, KE 4/5, ADF/PF 4+/5 Skin: Skin is warm and dry. Intact.     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    1. 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  Cont HEP  Will schedule for Botox to right elbow flexors, wrist flexors and ankle flexors  Family not interested in assistive device for gait at present   2. Post-op pain Management  Minimal at present  3. Glaucoma  Cont Timoptic, Xalatan   4. Schizophrenia  Cont [place] and thorazine daily    Meds reviewed Referrals reviewed All questions answered

## 2017-03-22 ENCOUNTER — Other Ambulatory Visit: Payer: Self-pay | Admitting: Physical Medicine and Rehabilitation

## 2017-03-23 ENCOUNTER — Telehealth (INDEPENDENT_AMBULATORY_CARE_PROVIDER_SITE_OTHER): Payer: Self-pay | Admitting: Vascular Surgery

## 2017-03-23 NOTE — Telephone Encounter (Signed)
Patient was prescribed this medication but was not given any additional refills. Should she continue this medication and if so, how many refills should be given?

## 2017-03-23 NOTE — Telephone Encounter (Signed)
Sure. Please call the Xarelto in. Please document in the chart it was called in. Thanks.

## 2017-03-23 NOTE — Telephone Encounter (Signed)
Called the patient to let her know to continue the Xarelto until the filter is decided on to be taken out. She was also made aware that the pharmacy has been called to refill the medication this time plus two additional refills.

## 2017-03-23 NOTE — Telephone Encounter (Signed)
Facility was wondering if Dr. Wyn Quaker wanted the patient to continue taking Xarelto. If so, then she needs a refill sent to [facility] in [place]. Please advise.

## 2017-04-07 ENCOUNTER — Encounter: Payer: Self-pay | Admitting: Physical Medicine & Rehabilitation

## 2017-04-07 ENCOUNTER — Encounter: Payer: Medicaid Other | Attending: Physical Medicine & Rehabilitation | Admitting: Physical Medicine & Rehabilitation

## 2017-04-07 VITALS — BP 106/72 | HR 82

## 2017-04-07 DIAGNOSIS — H409 Unspecified glaucoma: Secondary | ICD-10-CM | POA: Insufficient documentation

## 2017-04-07 DIAGNOSIS — F329 Major depressive disorder, single episode, unspecified: Secondary | ICD-10-CM | POA: Insufficient documentation

## 2017-04-07 DIAGNOSIS — G8113 Spastic hemiplegia affecting right nondominant side: Secondary | ICD-10-CM

## 2017-04-07 DIAGNOSIS — G2119 Other drug induced secondary parkinsonism: Secondary | ICD-10-CM | POA: Insufficient documentation

## 2017-04-07 DIAGNOSIS — G802 Spastic hemiplegic cerebral palsy: Secondary | ICD-10-CM

## 2017-04-07 DIAGNOSIS — M87051 Idiopathic aseptic necrosis of right femur: Secondary | ICD-10-CM | POA: Diagnosis not present

## 2017-04-07 DIAGNOSIS — Z9889 Other specified postprocedural states: Secondary | ICD-10-CM | POA: Insufficient documentation

## 2017-04-07 DIAGNOSIS — K219 Gastro-esophageal reflux disease without esophagitis: Secondary | ICD-10-CM | POA: Diagnosis not present

## 2017-04-07 DIAGNOSIS — Z96641 Presence of right artificial hip joint: Secondary | ICD-10-CM | POA: Insufficient documentation

## 2017-04-07 DIAGNOSIS — G809 Cerebral palsy, unspecified: Secondary | ICD-10-CM | POA: Diagnosis not present

## 2017-04-07 DIAGNOSIS — F209 Schizophrenia, unspecified: Secondary | ICD-10-CM | POA: Diagnosis not present

## 2017-04-07 DIAGNOSIS — G8110 Spastic hemiplegia affecting unspecified side: Secondary | ICD-10-CM

## 2017-04-07 NOTE — Progress Notes (Signed)
Botox: Procedure Note Patient Name: Hannah DowseLatoya D Kennedy DOB: June 06, 1986 MRN: [id number]   Procedure: Botulinum toxin administration Guidance: EMG Diagnosis: Spatic right nondominant hemiparesis Date: 04/07/17 Attending: Maryla MorrowAnkit Patel, MD    Informed consent: Risks, benefits & options of the procedure are explained to the patient (and/or family). The patient elects to proceed with procedure. Risks include but are not limited to weakness, respiratory distress, dry mouth, ptosis, antibody formation, worsening of some areas of function. Benefits include decreased abnormal muscle tone, improved hygiene and positioning, decreased skin breakdown and, in some cases, decreased pain. Options include conservative management with oral antispasticity agents, phenol chemodenervation of nerve or at motor nerve branches. More invasive options include intrathecal balcofen adminstration for appropriate candidates. Surgical options may include tendon lengthening or transposition or, rarely, dorsal rhizotomy.   History/Physical Examination: 31 y.o. 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   mAS  Right elbow flexors: 3/4   Right wrist flexors 3/4   Right finger flexors 2/4   Right plantar flexors 2/4 Previous Treatments: Therapy/Range of motion/Medications Indication for guidance: Target active muscules  Procedure: Botulinum toxin was mixed with preservative free saline with a dilution of 1cc to 100 units. Targeted limb and muscles were identified. The skin was prepped with alcohol swabs and placement of needle tip in targeted muscle was confirmed using appropriate guidance. Prior to injection, positioning of needle tip outside of blood vessel was determined by pulling back on syringe plunger.  MUSCLE UNITS Right Med biceps: 50U Right FCR: 25U Right FCU: 25U Right Med Gastroc: 50U Right Lat  Gastroc: 50U  Total units used: 200U Complications: None Plan: Follow up in 6 weeks  Will revisit nbracing in future  Ankit Karis JubaAnil Patel 8:53 AM

## 2017-05-19 ENCOUNTER — Encounter: Payer: Self-pay | Admitting: Physical Medicine & Rehabilitation

## 2017-05-19 ENCOUNTER — Encounter: Payer: Medicaid Other | Attending: Physical Medicine & Rehabilitation | Admitting: Physical Medicine & Rehabilitation

## 2017-05-19 VITALS — BP 112/78 | HR 95 | Resp 14

## 2017-05-19 DIAGNOSIS — M87051 Idiopathic aseptic necrosis of right femur: Secondary | ICD-10-CM | POA: Diagnosis present

## 2017-05-19 DIAGNOSIS — G2119 Other drug induced secondary parkinsonism: Secondary | ICD-10-CM | POA: Insufficient documentation

## 2017-05-19 DIAGNOSIS — Z96641 Presence of right artificial hip joint: Secondary | ICD-10-CM | POA: Diagnosis not present

## 2017-05-19 DIAGNOSIS — G802 Spastic hemiplegic cerebral palsy: Secondary | ICD-10-CM

## 2017-05-19 DIAGNOSIS — F209 Schizophrenia, unspecified: Secondary | ICD-10-CM | POA: Insufficient documentation

## 2017-05-19 DIAGNOSIS — F329 Major depressive disorder, single episode, unspecified: Secondary | ICD-10-CM | POA: Diagnosis not present

## 2017-05-19 DIAGNOSIS — K219 Gastro-esophageal reflux disease without esophagitis: Secondary | ICD-10-CM | POA: Insufficient documentation

## 2017-05-19 DIAGNOSIS — Z9889 Other specified postprocedural states: Secondary | ICD-10-CM | POA: Insufficient documentation

## 2017-05-19 DIAGNOSIS — H409 Unspecified glaucoma: Secondary | ICD-10-CM | POA: Insufficient documentation

## 2017-05-19 DIAGNOSIS — G809 Cerebral palsy, unspecified: Secondary | ICD-10-CM | POA: Diagnosis not present

## 2017-05-19 NOTE — Progress Notes (Signed)
Subjective:    Patient ID: Hannah DowseLatoya D Kennedy, female    DOB: [birth year], 31 y.o.   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    Initially stated: Caregiver provides majority of history. She is staying at the group home during week and grand mother's today.  She presents for hospital follow up.  Since last visit, she saw Ortho and was discharged. She saw PCP as well. She is minimal pain. She completed PT and is no longer using assistive device.  Denies falls.   Last clinic visit 04/07/17.  At that time she had Botox injections, which she believes provided good benefit.    Pain Inventory Average Pain 0 Pain Right Now 0 My pain is no pain  In the last 24 hours, has pain interfered with the following? General activity 5 Relation with others 10 Enjoyment of life 10 What TIME of day is your pain at its worst? no pain Sleep (in general) Good  Pain is worse with: no pain Pain improves with: no pain Relief from Meds: no pain  Mobility walk without assistance ability to climb steps?  yes do you drive?  no  Function disabled: date disabled .  Neuro/Psych weakness  Prior Studies Any changes since last visit?  no  Physicians involved in your care Any changes since last visit?  no   Family History  Problem Relation Age of Onset  . Family history unknown: Yes   Social History   Social History  . Marital status: Single    Spouse name: N/A  . Number of children: N/A  . Years of education: N/A   Social History Main Topics  . Smoking status: Never Smoker  . Smokeless tobacco: Never Used  . Alcohol use No  . Drug use: No  . Sexual activity: Not Asked   Other Topics Concern  . None   Social History Narrative  . None   Past Surgical History:  Procedure Laterality Date  . CSF SHUNT    . IVC FILTER INSERTION    . IVC  FILTER INSERTION N/A 01/13/2017   Procedure: IVC Filter Insertion;  Surgeon: Annice NeedyJason S Dew, MD;  Location: [facility];  Service: Cardiovascular;  Laterality: N/A;  . TOTAL HIP ARTHROPLASTY Right 01/18/2017   Procedure: TOTAL HIP ARTHROPLASTY ANTERIOR APPROACH;  Surgeon: Kennedy BuckerMichael Menz, MD;  Location: [facility] ORS;  Service: Orthopedics;  Laterality: Right;   Past Medical History:  Diagnosis Date  . Arthritis   . Cerebral palsy (HCC)    with hydrocephalus  . Chronic anterior uveitis   . Depression   . GERD (gastroesophageal reflux disease)   . Glaucoma   . Hemiparesis (HCC)    right side  . 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 Pulmonary emboli (HCC)   . Schizophrenia (HCC)   . Vitamin B 12 deficiency    BP 112/78   Pulse 95   Resp 14   SpO2 95%   Opioid Risk Score:   Fall Risk Score:  `1  Depression screen PHQ 2/9  No flowsheet data found.  Review of Systems  HENT: Negative.   Eyes: Negative.   Respiratory: Negative.   Gastrointestinal: Negative.   Endocrine: Negative.   Genitourinary: Positive for urgency.  Musculoskeletal: Positive for gait problem.  Skin: Negative.   Allergic/Immunologic: Negative.   Neurological: Positive for speech difficulty and  weakness.  Hematological: Negative.   Psychiatric/Behavioral: Positive for confusion.      Objective:   Physical Exam Constitutional: She appears well-developed and well-nourished.  HENT: Normocephalic and atraumatic.  Eyes: EOMI. No discharge.  Cardiovascular: RRR. No JVD. Respiratory: Effort normal and breath sounds normal.  GI: Soft. Bowel sounds are normal.  Musculoskeletal: No edema. No tenderness. Scissoring gait.  RLE Genuvalgum Neurological: She is alert and oriented Motor:  RLE: HF 4/5, KE 4/5, 2/5 ADF/PF.  RUE 4/5 within available range mAS: 2/4 elbow flexors, 3/4 wrist flexors, 2/4 finger flexors, 2/4 plantar flexors LUE: 4+/5 proximal to distal LLE: HF, KE 4/5, ADF/PF 4+/5 Skin: Skin is warm  and dry. Intact.     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    1. 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  Cont HEP  On last visit, Botox injected:   Right Med biceps: 50U (will increase to 75)   Right FCR: 25U (will increase to 75)   Right FCU: 25U (will increase to 75)   Right Med Gastroc: 50U (will increase to 100)   Right Lat Gastroc: 50U (will increase to 100)  Repeat injection in 6 weeks

## 2017-05-31 ENCOUNTER — Other Ambulatory Visit: Payer: Self-pay | Admitting: Physical Medicine & Rehabilitation

## 2017-05-31 ENCOUNTER — Other Ambulatory Visit (INDEPENDENT_AMBULATORY_CARE_PROVIDER_SITE_OTHER): Payer: Self-pay | Admitting: Vascular Surgery

## 2017-06-08 ENCOUNTER — Encounter (INDEPENDENT_AMBULATORY_CARE_PROVIDER_SITE_OTHER): Payer: Self-pay

## 2017-06-09 ENCOUNTER — Other Ambulatory Visit (INDEPENDENT_AMBULATORY_CARE_PROVIDER_SITE_OTHER): Payer: Self-pay

## 2017-06-15 MED ORDER — CEFAZOLIN SODIUM-DEXTROSE 1-4 GM/50ML-% IV SOLN: 1.0000 g | Freq: Once | INTRAVENOUS | Status: DC

## 2017-06-16 ENCOUNTER — Encounter
Admission: RE | Disposition: A | Discharge status: Home / Self Care | Payer: Self-pay | Source: Ambulatory Visit | Attending: Vascular Surgery

## 2017-06-16 ENCOUNTER — Encounter: Payer: Self-pay | Admitting: *Deleted

## 2017-06-16 ENCOUNTER — Ambulatory Visit
Admission: RE | Admit: 2017-06-16 | Discharge: 2017-06-16 | Disposition: A | Discharge status: Home / Self Care | Payer: Medicaid Other | Source: Ambulatory Visit | Attending: Vascular Surgery | Admitting: Vascular Surgery

## 2017-06-16 DIAGNOSIS — Z4589 Encounter for adjustment and management of other implanted devices: Secondary | ICD-10-CM | POA: Insufficient documentation

## 2017-06-16 DIAGNOSIS — H409 Unspecified glaucoma: Secondary | ICD-10-CM | POA: Insufficient documentation

## 2017-06-16 DIAGNOSIS — Z96641 Presence of right artificial hip joint: Secondary | ICD-10-CM | POA: Diagnosis not present

## 2017-06-16 DIAGNOSIS — Z452 Encounter for adjustment and management of vascular access device: Secondary | ICD-10-CM | POA: Diagnosis not present

## 2017-06-16 DIAGNOSIS — Z882 Allergy status to sulfonamides status: Secondary | ICD-10-CM | POA: Insufficient documentation

## 2017-06-16 DIAGNOSIS — Z86711 Personal history of pulmonary embolism: Secondary | ICD-10-CM | POA: Diagnosis not present

## 2017-06-16 DIAGNOSIS — G803 Athetoid cerebral palsy: Secondary | ICD-10-CM | POA: Diagnosis not present

## 2017-06-16 DIAGNOSIS — M1990 Unspecified osteoarthritis, unspecified site: Secondary | ICD-10-CM | POA: Diagnosis not present

## 2017-06-16 DIAGNOSIS — Z86718 Personal history of other venous thrombosis and embolism: Secondary | ICD-10-CM

## 2017-06-16 DIAGNOSIS — G809 Cerebral palsy, unspecified: Secondary | ICD-10-CM | POA: Insufficient documentation

## 2017-06-16 DIAGNOSIS — F209 Schizophrenia, unspecified: Secondary | ICD-10-CM | POA: Diagnosis not present

## 2017-06-16 DIAGNOSIS — K219 Gastro-esophageal reflux disease without esophagitis: Secondary | ICD-10-CM | POA: Insufficient documentation

## 2017-06-16 DIAGNOSIS — Z9889 Other specified postprocedural states: Secondary | ICD-10-CM | POA: Diagnosis not present

## 2017-06-16 HISTORY — PX: IVC FILTER REMOVAL: CATH118246

## 2017-06-16 SURGERY — IVC FILTER REMOVAL
Anesthesia: Moderate Sedation

## 2017-06-16 MED ORDER — SODIUM CHLORIDE 0.9 % IV SOLN
INTRAVENOUS | Status: DC
  Administered 2017-06-16: 09:00:00 via INTRAVENOUS

## 2017-06-16 MED ORDER — MIDAZOLAM HCL 2 MG/2ML IJ SOLN
INTRAMUSCULAR | Status: DC | PRN
  Administered 2017-06-16: 10:00:00
  Administered 2017-06-16: 2 mg via INTRAVENOUS

## 2017-06-16 MED ORDER — ALUM & MAG HYDROXIDE-SIMETH 200-200-20 MG/5ML PO SUSP: 15.0000 mL | ORAL | Status: DC | PRN

## 2017-06-16 MED ORDER — SODIUM CHLORIDE 0.9 % IV SOLN: 500.0000 mL | Freq: Once | INTRAVENOUS | Status: DC | PRN

## 2017-06-16 MED ORDER — FENTANYL CITRATE (PF) 100 MCG/2ML IJ SOLN
INTRAMUSCULAR | Status: AC
  Filled 2017-06-16: qty 2

## 2017-06-16 MED ORDER — OXYCODONE-ACETAMINOPHEN 5-325 MG PO TABS: 1.0000 | ORAL_TABLET | ORAL | Status: DC | PRN

## 2017-06-16 MED ORDER — ONDANSETRON HCL 4 MG/2ML IJ SOLN: 4.0000 mg | Freq: Four times a day (QID) | INTRAMUSCULAR | Status: DC | PRN

## 2017-06-16 MED ORDER — HYDRALAZINE HCL 20 MG/ML IJ SOLN: 5.0000 mg | INTRAMUSCULAR | Status: DC | PRN

## 2017-06-16 MED ORDER — ACETAMINOPHEN 325 MG RE SUPP
325.0000 mg | RECTAL | Status: DC | PRN
  Filled 2017-06-16: qty 2

## 2017-06-16 MED ORDER — HEPARIN (PORCINE) IN NACL 2-0.9 UNIT/ML-% IJ SOLN
INTRAMUSCULAR | Status: AC
  Filled 2017-06-16: qty 1000

## 2017-06-16 MED ORDER — FENTANYL CITRATE (PF) 100 MCG/2ML IJ SOLN
INTRAMUSCULAR | Status: DC | PRN
  Administered 2017-06-16: 50 ug via INTRAVENOUS
  Administered 2017-06-16: 25 ug via INTRAVENOUS

## 2017-06-16 MED ORDER — ACETAMINOPHEN 325 MG PO TABS: 325.0000 mg | ORAL_TABLET | ORAL | Status: DC | PRN

## 2017-06-16 MED ORDER — LABETALOL HCL 5 MG/ML IV SOLN: 10.0000 mg | INTRAVENOUS | Status: DC | PRN

## 2017-06-16 MED ORDER — MORPHINE SULFATE (PF) 4 MG/ML IV SOLN: 2.0000 mg | INTRAVENOUS | Status: DC | PRN

## 2017-06-16 MED ORDER — GUAIFENESIN-DM 100-10 MG/5ML PO SYRP: 15.0000 mL | ORAL_SOLUTION | ORAL | Status: DC | PRN

## 2017-06-16 MED ORDER — MIDAZOLAM HCL 5 MG/5ML IJ SOLN
INTRAMUSCULAR | Status: AC
  Filled 2017-06-16: qty 5

## 2017-06-16 MED ORDER — LIDOCAINE-EPINEPHRINE (PF) 2 %-1:200000 IJ SOLN
INTRAMUSCULAR | Status: AC
  Filled 2017-06-16: qty 20

## 2017-06-16 MED ORDER — HEPARIN SODIUM (PORCINE) 1000 UNIT/ML IJ SOLN
INTRAMUSCULAR | Status: AC
  Filled 2017-06-16: qty 1

## 2017-06-16 MED ORDER — PHENOL 1.4 % MT LIQD: 1.0000 | OROMUCOSAL | Status: DC | PRN

## 2017-06-16 MED ORDER — METOPROLOL TARTRATE 5 MG/5ML IV SOLN: 2.0000 mg | INTRAVENOUS | Status: DC | PRN

## 2017-06-16 SURGICAL SUPPLY — 3 items
PACK ANGIOGRAPHY (CUSTOM PROCEDURE TRAY) ×3 IMPLANT
SET VENACAVA FILTER RETRIEVAL (MISCELLANEOUS) ×3 IMPLANT
WIRE J 3MM .035X145CM (WIRE) ×3 IMPLANT

## 2017-06-16 NOTE — Op Note (Signed)
[facility]   OPERATIVE NOTE    PRE-OPERATIVE DIAGNOSIS:  1. DVT 2. status post IVC filter placement  POST-OPERATIVE DIAGNOSIS: Same as above  PROCEDURE: 1. Ultrasound guidance for vascular access right jugular vein 2. Catheter placement into inferior vena cava from right jugular vein 3. Inferior venacavogram 4. Retrieval of Cook Celect IVC filter  SURGEON: Festus BarrenJason Natesha Hassey, MD  ASSISTANT(S): None  ANESTHESIA: Local with moderate conscious sedation for approximately 15 minutes using 2 mg of Versed and 75 mcg of Fentanyl  ESTIMATED BLOOD LOSS: Minimal  CONTRAST:  15 cc  FLUORO TIME:  1.3 minutes  FINDING(S): 1. Patent IVC  SPECIMEN(S): IVC filter  INDICATIONS:  Patient is a 31 y.o. female who presents with a previous history of IVC filter placement. Patient has recovered well from her hip surgery and does not have an acute DVT and no longer needs this filter. The patient remains on anticoagulation. Risks and benefits were discussed, and informed consent was obtained.  DESCRIPTION: After obtaining full informed written consent, the patient was brought back to the vascular suite and placed supine upon the table.Moderate conscious sedation was administered during a face to face encounter with the patient throughout the procedure with my supervision of the RN administering medicines and monitoring the patient's vital signs, pulse oximetry, telemetry and mental status throughout from the start of the procedure until the patient was taken to the recovery room.  After obtaining adequate anesthesia, the patient was prepped and draped in the standard fashion. The right jugular vein was visualized with ultrasound and found to be widely patent. It was then accessed under direct ultrasound guidance without difficulty with the Seldinger needle and a permanent image was recorded. A J-wire was placed. After skin nick and dilatation, the retrieval sheath was  placed over the wire and advanced into the inferior vena cava. Inferior vena cava was imaged and found to be widely patent on inferior venacavogram. The filter was mildly tilted in its orientation. The retrieval snare was then placed through the sheath and the hook of the filter was snared without difficulty. The sheath was then advanced, and the filter was collapsed and brought into the sheath in its entirety. It was then removed from the body in its entirety. The retrieval sheath was then removed. Pressure was held at the access site and sterile dressing was placed. The patient was taken to the recovery room in stable condition having tolerated the procedure well.  COMPLICATIONS: None  CONDITION: Stable   Festus BarrenJason Raney Koeppen 06/16/2017 9:58 AM  This note was created with Dragon Medical transcription system. Any errors in dictation are purely unintentional.

## 2017-06-16 NOTE — H&P (Signed)
[facility] History & Physical Update  The patient was interviewed and re-examined.  The patient's previous History and Physical has been reviewed and is unchanged.  There is no change in the plan of care. We plan to proceed with the scheduled procedure.  Festus BarrenJason Eboni Coval, MD  06/16/2017, 8:14 AM

## 2017-06-16 NOTE — H&P (Signed)
[facility] Admission History & Physical  MRN : [id number]  Hannah Kennedy is a 31 y.o. (Feb 06, 1986) female who presents with chief complaint of No chief complaint on file. Marland Kitchen.  History of Present Illness: Patient returns today for removal of her IVC filter. This was placed about 4-5 months ago. This was placed prior to her hip replacement and she had a previous history of DVT and pulmonary emboli. She did not have any thrombotic complications after her hip replacement. She is here to have her filter retrieved today. She is in her usual state of health without complaints.  Current Facility-Administered Medications  Medication Dose Route Frequency Provider Last Rate Last Dose  . 0.9 %  sodium chloride infusion   Intravenous Continuous Dew, Marlow BaarsJason S, MD        Past Medical History:  Diagnosis Date  . Arthritis   . Cerebral palsy (HCC)    with hydrocephalus  . Chronic anterior uveitis   . Depression   . GERD (gastroesophageal reflux disease)   . Glaucoma   . Hemiparesis (HCC)    right side  . 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 Pulmonary emboli (HCC)   . Schizophrenia (HCC)   . Vitamin B 12 deficiency     Past Surgical History:  Procedure Laterality Date  . CSF SHUNT    . IVC FILTER INSERTION    . IVC FILTER INSERTION N/A 01/13/2017   Procedure: IVC Filter Insertion;  Surgeon: Annice NeedyJason S Dew, MD;  Location: [facility];  Service: Cardiovascular;  Laterality: N/A;  . TOTAL HIP ARTHROPLASTY Right 01/18/2017   Procedure: TOTAL HIP ARTHROPLASTY ANTERIOR APPROACH;  Surgeon: Kennedy BuckerMichael Menz, MD;  Location: [facility];  Service: Orthopedics;  Laterality: Right;    Social History Social History  Substance Use Topics  . Smoking status: Never Smoker  . Smokeless tobacco: Never Used  . Alcohol use No  Lives in a group home  Family History Family History  Problem Relation Age of Onset  . Family history unknown: Yes    Allergies  Allergen Reactions  .  Sulfamethoxazole-Trimethoprim Other (See Comments)    Other reaction(s): GI upset     REVIEW OF SYSTEMS (Negative unless checked)  Constitutional: [] Weight loss  [] Fever  [] Chills Cardiac: [] Chest pain   [] Chest pressure   [] Palpitations   [] Shortness of breath when laying flat   [] Shortness of breath at rest   [] Shortness of breath with exertion. Vascular:  [] Pain in legs with walking   [] Pain in legs at rest   [] Pain in legs when laying flat   [] Claudication   [] Pain in feet when walking  [] Pain in feet at rest  [] Pain in feet when laying flat   [x] History of DVT   [] Phlebitis   [] Swelling in legs   [] Varicose veins   [] Non-healing ulcers Pulmonary:   [] Uses home oxygen   [] Productive cough   [] Hemoptysis   [] Wheeze  [] COPD   [] Asthma Neurologic:  [] Dizziness  [] Blackouts   [x] Seizures   [] History of stroke   [] History of TIA  [] Aphasia   [] Temporary blindness   [] Dysphagia   [x] Weakness or numbness in arms   [x] Weakness or numbness in legs Musculoskeletal:  [x] Arthritis   [] Joint swelling   [] Joint pain   [] Low back pain Hematologic:  [] Easy bruising  [] Easy bleeding   [] Hypercoagulable state   [] Anemic  [] Hepatitis Gastrointestinal:  [] Blood in stool   [] Vomiting blood  [] Gastroesophageal reflux/heartburn   [] Difficulty swallowing.  Genitourinary:  [] Chronic kidney disease   [] Difficult urination  [] Frequent urination  [] Burning with urination   [] Blood in urine Skin:  [] Rashes   [] Ulcers   [] Wounds Psychological:  [] History of anxiety   []  History of major depression.  Physical Examination  Vitals:   06/16/17 0738  BP: 120/76  Pulse: 72  Resp: 16  Temp: (!) 97.5 F (36.4 C)  TempSrc: Oral  SpO2: 98%  Weight: 60.8 kg (134 lb)  Height: 5\' 2"  (1.575 m)   Body mass index is 24.51 kg/m. Gen: WD/WN, NAD Head: [place]/AT, No temporalis wasting.  Ear/Nose/Throat: Hearing grossly intact, nares w/o erythema or drainage, oropharynx w/o Erythema/Exudate,  Eyes: Conjunctiva clear, sclera  non-icteric Neck: Trachea midline.  No JVD.  Pulmonary:  Good air movement, respirations not labored, no use of accessory muscles.  Cardiac: RRR, normal S1, S2. Vascular:  Vessel Right Left  Radial Palpable Palpable                          PT Palpable Palpable  DP Palpable Palpable    Musculoskeletal: Right-sided hemi-[place]. Rigidity and spasticity throughout.  Extremities without ischemic changes.   Neurologic: Sensation grossly intact in extremities. Right-sided weakness Speech is difficult to discern. Motor exam as listed above. Psychiatric: Judgment and insight are poor. Dermatologic: No rashes or ulcers noted.  No cellulitis or open wounds.     CBC Lab Results  Component Value Date   WBC 10.1 02/04/2017   HGB 11.6 (L) 02/04/2017   HCT 36.7 02/04/2017   MCV 90.2 02/04/2017   PLT 296 02/04/2017    BMET    Component Value Date/Time   NA 139 02/04/2017 0553   NA 135 (L) 07/12/2013 0542   K 3.9 02/04/2017 0553   K 3.6 07/12/2013 0542   CL 102 02/04/2017 0553   CL 102 07/12/2013 0542   CO2 29 02/04/2017 0553   CO2 27 07/12/2013 0542   GLUCOSE 87 02/04/2017 0553   GLUCOSE 94 07/12/2013 0542   BUN 11 02/04/2017 0553   BUN 3 (L) 07/12/2013 0542   CREATININE 0.66 02/04/2017 0553   CREATININE 0.60 07/12/2013 0542   CALCIUM 9.5 02/04/2017 0553   CALCIUM 9.8 07/12/2013 0542   GFRNONAA >60 02/04/2017 0553   GFRNONAA >60 07/12/2013 0542   GFRAA >60 02/04/2017 0553   GFRAA >60 07/12/2013 0542   CrCl cannot be calculated (Patient's most recent lab result is older than the maximum 21 days allowed.).  COAG Lab Results  Component Value Date   INR 1.07 01/14/2017   INR 1.6 12/31/2013   INR 1.7 12/17/2013    Radiology No results found.   Assessment/Plan 1. Status post IVC filter placement. No current acute DVT. We'll retrieve the filter today. Had a long history of previous DVT and PEs and this was placed prior to her hip replacement. 2. Cerebral palsy.  Chronic. Debilitated state. 3. Schizophrenia. Poor functional status.   Festus BarrenJason Dew, MD  06/16/2017 9:10 AM

## 2017-06-17 ENCOUNTER — Encounter: Payer: Self-pay | Admitting: Vascular Surgery

## 2017-06-27 ENCOUNTER — Telehealth (INDEPENDENT_AMBULATORY_CARE_PROVIDER_SITE_OTHER): Payer: Self-pay | Admitting: Vascular Surgery

## 2017-06-27 NOTE — Telephone Encounter (Signed)
IVC FILTER FOR PATIENT WAS TAKEN OUT ABOUT 2WKS AGO WAS PLACED ON XARELTO HAS 12 DAYS SUPPLY LEFT. hOW LONG SHOULD SHE CONTINUE MEDICATION. I ADVISED HER JD WILL NOT BE IN OFFICE UNTIL TOMORROW AND TO EXPECT A CALL BACK THEN.

## 2017-06-29 NOTE — Telephone Encounter (Signed)
I have called the patient number multiple times but the line has been busy each time

## 2017-06-30 ENCOUNTER — Encounter: Payer: Self-pay | Admitting: Physical Medicine & Rehabilitation

## 2017-06-30 ENCOUNTER — Encounter: Payer: Medicaid Other | Attending: Physical Medicine & Rehabilitation | Admitting: Physical Medicine & Rehabilitation

## 2017-06-30 VITALS — BP 120/80 | HR 95

## 2017-06-30 DIAGNOSIS — M87051 Idiopathic aseptic necrosis of right femur: Secondary | ICD-10-CM | POA: Insufficient documentation

## 2017-06-30 DIAGNOSIS — G2119 Other drug induced secondary parkinsonism: Secondary | ICD-10-CM | POA: Diagnosis not present

## 2017-06-30 DIAGNOSIS — G802 Spastic hemiplegic cerebral palsy: Secondary | ICD-10-CM | POA: Diagnosis not present

## 2017-06-30 DIAGNOSIS — F329 Major depressive disorder, single episode, unspecified: Secondary | ICD-10-CM | POA: Insufficient documentation

## 2017-06-30 DIAGNOSIS — Z9889 Other specified postprocedural states: Secondary | ICD-10-CM | POA: Insufficient documentation

## 2017-06-30 DIAGNOSIS — K219 Gastro-esophageal reflux disease without esophagitis: Secondary | ICD-10-CM | POA: Insufficient documentation

## 2017-06-30 DIAGNOSIS — F209 Schizophrenia, unspecified: Secondary | ICD-10-CM | POA: Diagnosis not present

## 2017-06-30 DIAGNOSIS — G8113 Spastic hemiplegia affecting right nondominant side: Secondary | ICD-10-CM

## 2017-06-30 DIAGNOSIS — G809 Cerebral palsy, unspecified: Secondary | ICD-10-CM | POA: Insufficient documentation

## 2017-06-30 DIAGNOSIS — Z96641 Presence of right artificial hip joint: Secondary | ICD-10-CM | POA: Insufficient documentation

## 2017-06-30 DIAGNOSIS — H409 Unspecified glaucoma: Secondary | ICD-10-CM | POA: Diagnosis not present

## 2017-06-30 NOTE — Progress Notes (Addendum)
Botox: Procedure Note Patient Name: Hannah Kennedy DOB: 06-21-1986 MRN: [id number]  Date: 06/30/17  Procedure: Botulinum toxin administration Guidance: EMG Diagnosis: Spatic right nondominant hemiparesis Attending: Maryla MorrowAnkit Patel, MD   Informed consent: Risks, benefits & options of the procedure are explained to the patient (and/or family). The patient elects to proceed with procedure. Risks include but are not limited to weakness, respiratory distress, dry mouth, ptosis, antibody formation, worsening of some areas of function. Benefits include decreased abnormal muscle tone, improved hygiene and positioning, decreased skin breakdown and, in some cases, decreased pain. Options include conservative management with oral antispasticity agents, phenol chemodenervation of nerve or at motor nerve branches. More invasive options include intrathecal balcofen adminstration for appropriate candidates. Surgical options may include tendon lengthening or transposition or, rarely, dorsal rhizotomy.   History/Physical Examination: 31 y.o. 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   mAS  Right elbow flexors: 1+/4   Right wrist flexors 3/4   Right finger flexors 3/4   Right plantar flexors 2/4 Previous Treatments: Therapy/Range of motion/Medications Indication for guidance: Target active muscules  Procedure: Botulinum toxin was mixed with preservative free saline with a dilution of 1cc to 100 units. Targeted limb and muscles were identified. The skin was prepped with alcohol swabs and placement of needle tip in targeted muscle was confirmed using appropriate guidance. Prior to injection, positioning of needle tip outside of blood vessel was determined by pulling back on syringe plunger.  MUSCLE UNITS Right Med biceps: 75U Right FCR: 75U Right FCU: 75U Right Med Gastroc: 100U Right Lat  Gastroc: 100U  Total units used: 425 Units Total unavoidable waste: 75 Units Complications: None  Plan: Follow up in 6 weeks  WHO ordered  Hannah Kennedy 9:23 AM

## 2017-07-06 NOTE — Telephone Encounter (Signed)
DALE FROM [place] CANTS TO KNOW HOW LONG SHOU;D [place] STAY ON XARELTO 3 PILLS LEFT. DALES # IS WHERE [phone]

## 2017-07-06 NOTE — Telephone Encounter (Signed)
[facility] with the information from prior notes from an older telephone call message

## 2017-08-11 ENCOUNTER — Telehealth: Payer: Self-pay | Admitting: Physical Medicine & Rehabilitation

## 2017-08-11 ENCOUNTER — Encounter: Payer: Medicaid Other | Attending: Physical Medicine & Rehabilitation | Admitting: Physical Medicine & Rehabilitation

## 2017-08-11 ENCOUNTER — Encounter: Payer: Self-pay | Admitting: Physical Medicine & Rehabilitation

## 2017-08-11 VITALS — BP 105/66 | HR 123 | Resp 14

## 2017-08-11 DIAGNOSIS — G802 Spastic hemiplegic cerebral palsy: Secondary | ICD-10-CM

## 2017-08-11 DIAGNOSIS — F329 Major depressive disorder, single episode, unspecified: Secondary | ICD-10-CM | POA: Insufficient documentation

## 2017-08-11 DIAGNOSIS — K219 Gastro-esophageal reflux disease without esophagitis: Secondary | ICD-10-CM | POA: Diagnosis not present

## 2017-08-11 DIAGNOSIS — H409 Unspecified glaucoma: Secondary | ICD-10-CM | POA: Diagnosis not present

## 2017-08-11 DIAGNOSIS — G2119 Other drug induced secondary parkinsonism: Secondary | ICD-10-CM | POA: Insufficient documentation

## 2017-08-11 DIAGNOSIS — F209 Schizophrenia, unspecified: Secondary | ICD-10-CM | POA: Diagnosis not present

## 2017-08-11 DIAGNOSIS — M87051 Idiopathic aseptic necrosis of right femur: Secondary | ICD-10-CM | POA: Insufficient documentation

## 2017-08-11 DIAGNOSIS — G809 Cerebral palsy, unspecified: Secondary | ICD-10-CM | POA: Insufficient documentation

## 2017-08-11 DIAGNOSIS — Z9889 Other specified postprocedural states: Secondary | ICD-10-CM | POA: Diagnosis not present

## 2017-08-11 DIAGNOSIS — Z96641 Presence of right artificial hip joint: Secondary | ICD-10-CM | POA: Diagnosis not present

## 2017-08-11 DIAGNOSIS — G8113 Spastic hemiplegia affecting right nondominant side: Secondary | ICD-10-CM

## 2017-08-11 MED ORDER — BACLOFEN 10 MG PO TABS: 10.0000 mg | ORAL_TABLET | Freq: Every day | ORAL | 1 refills | Status: DC

## 2017-08-11 MED ORDER — BACLOFEN 10 MG PO TABS: 20.0000 mg | ORAL_TABLET | Freq: Three times a day (TID) | ORAL | 1 refills | Status: AC

## 2017-08-11 NOTE — Telephone Encounter (Signed)
Contacted pharmacy, they wanted to clarify baclofen order.  Last clinic note indicated increase to 20 mg TID.  Conveyed Dr. Eliane Decree note to pharmacist. Previous order disregarded

## 2017-08-11 NOTE — Progress Notes (Signed)
Subjective:    Patient ID: Hannah Kennedy, female    DOB: 04-09-1986, 31 y.o.   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    Initially stated: Caregiver provides majority of history. She is staying at the group home during week and grand mother's today.  She presents for hospital follow up.  Since last visit, she saw Ortho and was discharged. She saw PCP as well. She is minimal pain. She completed PT and is no longer using assistive device.  Denies falls.   Last clinic visit 06/30/17.  At that time, she had Botox injection.  She is somnolent today.  She believes she had benefit from the injection.    Pain Inventory Average Pain 0 Pain Right Now 0 My pain is no pain  In the last 24 hours, has pain interfered with the following? General activity 5 Relation with others 10 Enjoyment of life 10 What TIME of day is your pain at its worst? no pain Sleep (in general) Good  Pain is worse with: no pain Pain improves with: no pain Relief from Meds: no pain  Mobility walk without assistance ability to climb steps?  yes do you drive?  no  Function disabled: date disabled .  Neuro/Psych weakness trouble walking  Prior Studies Any changes since last visit?  no  Physicians involved in your care Any changes since last visit?  no   Family History  Problem Relation Age of Onset  . Family history unknown: Yes   Social History   Social History  . Marital status: Single    Spouse name: N/A  . Number of children: N/A  . Years of education: N/A   Social History Main Topics  . Smoking status: Never Smoker  . Smokeless tobacco: Never Used  . Alcohol use No  . Drug use: No  . Sexual activity: Not Asked   Other Topics Concern  . None   Social History Narrative  . None   Past Surgical History:  Procedure Laterality Date  .  CSF SHUNT    . IVC FILTER INSERTION    . IVC FILTER INSERTION N/A 01/13/2017   Procedure: IVC Filter Insertion;  Surgeon: Annice Needy, MD;  Location: [facility];  Service: Cardiovascular;  Laterality: N/A;  . IVC FILTER REMOVAL N/A 06/16/2017   Procedure: IVC Filter Removal;  Surgeon: Annice Needy, MD;  Location: [facility];  Service: Cardiovascular;  Laterality: N/A;  . TOTAL HIP ARTHROPLASTY Right 01/18/2017   Procedure: TOTAL HIP ARTHROPLASTY ANTERIOR APPROACH;  Surgeon: Kennedy Bucker, MD;  Location: [facility];  Service: Orthopedics;  Laterality: Right;   Past Medical History:  Diagnosis Date  . Arthritis   . Cerebral palsy (HCC)    with hydrocephalus  . Chronic anterior uveitis   . Depression   . GERD (gastroesophageal reflux disease)   . Glaucoma   . Hemiparesis (HCC)    right side  . 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 Pulmonary emboli (HCC)   . Schizophrenia (HCC)   . Vitamin B 12 deficiency    BP 105/66 (BP Location: Left Arm, Patient Position: Sitting, Cuff Size: Normal)   Pulse (!) 123   Resp 14   SpO2 93%   Opioid Risk Score:   Fall Risk Score:  `1  Depression screen PHQ 2/9  No flowsheet  data found.  Review of Systems  HENT: Negative.   Eyes: Negative.   Respiratory: Negative.   Gastrointestinal: Negative.   Endocrine: Negative.   Genitourinary: Positive for urgency.  Musculoskeletal: Positive for gait problem.       Spasms   Skin: Negative.   Allergic/Immunologic: Negative.   Neurological: Positive for speech difficulty and weakness.  Hematological: Negative.   Psychiatric/Behavioral: Positive for confusion.  All other systems reviewed and are negative.     Objective:   Physical Exam Constitutional: She appears well-developed and well-nourished.  HENT: Normocephalic and atraumatic.  Eyes: EOMI. No discharge.  Cardiovascular: RRR. No JVD. Respiratory: Effort normal and breath sounds normal.  GI: Soft. Bowel sounds are normal.    Musculoskeletal: No edema. No tenderness. Scissoring gait.  RLE Genuvalgum Neurological: She is somnolent. Motor:  RLE: HF 4/5, KE 4/5, 2/5 ADF/PF.  RUE 4/5 within available range mAS: 1/4 elbow flexors, 3/4 wrist flexors, 2/4 finger flexors, 2/4 plantar flexors LUE: 4+/5 proximal to distal LLE: HF, KE 4/5, ADF/PF 4+/5 Skin: Skin is warm and dry. Intact.     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    1. 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  Cont HEP  On last visit, Botox injected, will change to dysport:   Right Med biceps: 150U   Right FCR: 200U   Right FCU: 200U   Will add right FDS 100U   Right Med Gastroc: Will increase to 300U   Right Lat Gastroc: Will increase to 300U  Will increase Baclofen to  TID  Repeat injection in 6 weeks  >25 minutes spent with >20 minutes with grandmother and caregiver over the phone in counseling regarding injections, meds, prognosis

## 2017-08-11 NOTE — Telephone Encounter (Signed)
Hannah Kennedy with pharmacy needs clarification about a medication that was refilled today. Please call her at [phone].

## 2017-10-05 ENCOUNTER — Encounter: Payer: Medicaid Other | Admitting: Physical Medicine & Rehabilitation

## 2017-11-03 ENCOUNTER — Encounter: Payer: Medicaid Other | Admitting: Physical Medicine & Rehabilitation

## 2017-11-23 ENCOUNTER — Other Ambulatory Visit: Payer: Self-pay | Admitting: Physical Medicine & Rehabilitation

## 2017-12-01 ENCOUNTER — Encounter: Payer: Self-pay | Admitting: Physical Medicine & Rehabilitation

## 2017-12-01 ENCOUNTER — Encounter: Payer: Medicaid Other | Attending: Physical Medicine & Rehabilitation | Admitting: Physical Medicine & Rehabilitation

## 2017-12-01 VITALS — BP 102/68 | HR 98

## 2017-12-01 DIAGNOSIS — G802 Spastic hemiplegic cerebral palsy: Secondary | ICD-10-CM | POA: Diagnosis not present

## 2017-12-01 DIAGNOSIS — G8193 Hemiplegia, unspecified affecting right nondominant side: Secondary | ICD-10-CM | POA: Diagnosis not present

## 2017-12-01 DIAGNOSIS — G2119 Other drug induced secondary parkinsonism: Secondary | ICD-10-CM | POA: Diagnosis not present

## 2017-12-01 DIAGNOSIS — Z96641 Presence of right artificial hip joint: Secondary | ICD-10-CM | POA: Diagnosis not present

## 2017-12-01 DIAGNOSIS — Z86718 Personal history of other venous thrombosis and embolism: Secondary | ICD-10-CM | POA: Insufficient documentation

## 2017-12-01 DIAGNOSIS — F209 Schizophrenia, unspecified: Secondary | ICD-10-CM | POA: Insufficient documentation

## 2017-12-01 NOTE — Progress Notes (Signed)
Dysport: Procedure Note Patient Name: Hannah Kennedy DOB: 1986-08-30 MRN: [id number]  Date: 12/01/17  Procedure: Botulinum toxin administration Guidance: EMG Diagnosis: Spatic right nondominant hemiparesis Attending: Maryla MorrowAnkit Polette Nofsinger, MD   Informed consent: Risks, benefits & options of the procedure are explained to the patient (and/or family). The patient elects to proceed with procedure. Risks include but are not limited to weakness, respiratory distress, dry mouth, ptosis, antibody formation, worsening of some areas of function. Benefits include decreased abnormal muscle tone, improved hygiene and positioning, decreased skin breakdown and, in some cases, decreased pain. Options include conservative management with oral antispasticity agents, phenol chemodenervation of nerve or at motor nerve branches. More invasive options include intrathecal balcofen adminstration for appropriate candidates. Surgical options may include tendon lengthening or transposition or, rarely, dorsal rhizotomy.   History/Physical Examination: 32 y.o. 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   mAS  Right elbow flexors: 1+/4   Right wrist flexors 2/4   Right finger flexors 3/4   Right plantar flexors 2/4 Previous Treatments: Therapy/Range of motion/Medications Indication for guidance: Target active muscules  Procedure: Botulinum toxin was mixed with preservative free saline with a dilution of 0.5cc to 100 units. Targeted limb and muscles were identified. The skin was prepped with alcohol swabs and placement of needle tip in targeted muscle was confirmed using appropriate guidance. Prior to injection, positioning of needle tip outside of blood vessel was determined by pulling back on syringe plunger.  MUSCLE UNITS Right Med biceps: 150U Right FCR: 200U Right FCU: 200U Right FDS: 100U Right Med  Gastroc: 300U Right Lat Gastroc: 300U  Total units used: 1250 Units Total unavoidable waste: 50 Units Complications: None  Plan: Follow up in 6 weeks  Hannah Kennedy 9:49 AM

## 2018-01-12 ENCOUNTER — Encounter: Payer: Medicaid Other | Admitting: Physical Medicine & Rehabilitation

## 2018-01-18 ENCOUNTER — Other Ambulatory Visit: Payer: Self-pay | Admitting: Physical Medicine & Rehabilitation

## 2018-02-02 ENCOUNTER — Encounter: Payer: Self-pay | Admitting: Physical Medicine & Rehabilitation

## 2018-02-02 ENCOUNTER — Encounter: Payer: Medicaid Other | Attending: Physical Medicine & Rehabilitation | Admitting: Physical Medicine & Rehabilitation

## 2018-02-02 ENCOUNTER — Other Ambulatory Visit: Payer: Self-pay

## 2018-02-02 VITALS — BP 110/75 | HR 126 | Resp 14

## 2018-02-02 DIAGNOSIS — F209 Schizophrenia, unspecified: Secondary | ICD-10-CM | POA: Diagnosis not present

## 2018-02-02 DIAGNOSIS — G8193 Hemiplegia, unspecified affecting right nondominant side: Secondary | ICD-10-CM | POA: Insufficient documentation

## 2018-02-02 DIAGNOSIS — Z96641 Presence of right artificial hip joint: Secondary | ICD-10-CM | POA: Diagnosis not present

## 2018-02-02 DIAGNOSIS — G802 Spastic hemiplegic cerebral palsy: Secondary | ICD-10-CM | POA: Diagnosis not present

## 2018-02-02 DIAGNOSIS — Z86718 Personal history of other venous thrombosis and embolism: Secondary | ICD-10-CM | POA: Insufficient documentation

## 2018-02-02 DIAGNOSIS — G2119 Other drug induced secondary parkinsonism: Secondary | ICD-10-CM | POA: Diagnosis not present

## 2018-02-02 MED ORDER — BACLOFEN 20 MG PO TABS: 30.0000 mg | ORAL_TABLET | Freq: Three times a day (TID) | ORAL | 1 refills | Status: DC

## 2018-02-02 NOTE — Addendum Note (Signed)
Addended by: Maryla MorrowPATEL, Vicki Chaffin A on: 02/02/2018 11:09 AM   Modules accepted: Orders

## 2018-02-02 NOTE — Progress Notes (Signed)
Subjective:    Patient ID: Hannah Kennedy, female    DOB: 1986/06/19, 32 y.o.   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    Initially stated: Caregiver provides majority of history. She is staying at the group home during week and grand mother's today.  She presents for hospital follow up.  Since last visit, she saw Ortho and was discharged. She saw PCP as well. She is minimal pain. She completed PT and is no longer using assistive device.  Denies falls.   Last clinic visit 12/01/17. Grandmother present, who provides some of history. She had Dysport injection at that time. Since that time, pt states she tolerated the injection and increase in Baclofen without issues.    Pain Inventory Average Pain 0 Pain Right Now 0 My pain is no pain  In the last 24 hours, has pain interfered with the following? General activity 5 Relation with others 10 Enjoyment of life 10 What TIME of day is your pain at its worst? no pain Sleep (in general) Good  Pain is worse with: no pain Pain improves with: no pain Relief from Meds: no pain  Mobility walk without assistance  Function disabled: date disabled .  Neuro/Psych No problems in this area  Prior Studies Any changes since last visit?  no  Physicians involved in your care Any changes since last visit?  no   Family History  Family history unknown: Yes   Social History   Socioeconomic History  . Marital status: Single    Spouse name: None  . Number of children: None  . Years of education: None  . Highest education level: None  Social Needs  . Financial resource strain: None  . Food insecurity - worry: None  . Food insecurity - inability: None  . Transportation needs - medical: None  . Transportation needs - non-medical: None  Occupational History  . None  Tobacco Use  .  Smoking status: Never Smoker  . Smokeless tobacco: Never Used  Substance and Sexual Activity  . Alcohol use: No  . Drug use: No  . Sexual activity: None  Other Topics Concern  . None  Social History Narrative  . None   Past Surgical History:  Procedure Laterality Date  . CSF SHUNT    . IVC FILTER INSERTION    . IVC FILTER INSERTION N/A 01/13/2017   Procedure: IVC Filter Insertion;  Surgeon: Annice Needy, MD;  Location: [facility];  Service: Cardiovascular;  Laterality: N/A;  . IVC FILTER REMOVAL N/A 06/16/2017   Procedure: IVC Filter Removal;  Surgeon: Annice Needy, MD;  Location: [facility];  Service: Cardiovascular;  Laterality: N/A;  . TOTAL HIP ARTHROPLASTY Right 01/18/2017   Procedure: TOTAL HIP ARTHROPLASTY ANTERIOR APPROACH;  Surgeon: Kennedy Bucker, MD;  Location: [facility];  Service: Orthopedics;  Laterality: Right;   Past Medical History:  Diagnosis Date  . Arthritis   . Cerebral palsy (HCC)    with hydrocephalus  . Chronic anterior uveitis   . Depression   . GERD (gastroesophageal reflux disease)   . Glaucoma   . Hemiparesis (HCC)    right side  . 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 Pulmonary emboli (HCC)   . Schizophrenia (HCC)   . Vitamin B 12 deficiency    BP 110/75   Pulse Marland Kitchen)  126   Resp 14   SpO2 93%   Opioid Risk Score:   Fall Risk Score:  `1  Depression screen PHQ 2/9  No flowsheet data found.  Review of Systems  HENT: Negative.   Eyes: Negative.   Respiratory: Negative.   Gastrointestinal: Negative.   Endocrine: Negative.   Genitourinary: Negative.   Musculoskeletal: Positive for gait problem.       Spasms   Skin: Negative.   Allergic/Immunologic: Negative.   Neurological: Positive for speech difficulty.  Hematological: Negative.   Psychiatric/Behavioral: Positive for confusion.  All other systems reviewed and are negative.     Objective:   Physical Exam Constitutional: She appears well-developed and well-nourished.   HENT: Normocephalic and atraumatic.  Eyes: EOMI. No discharge.  Cardiovascular: RRR. No JVD. Respiratory: Effort normal and breath sounds normal.  GI: Soft. Bowel sounds are normal.  Musculoskeletal: No edema. No tenderness. Scissoring gait.  RLE Genuvalgum Neurological: She is somnolent. Motor:  RLE: HF 4/5, KE 4/5, 2/5 ADF/PF.  RUE 4/5 within available range mAS: 1/4 elbow flexors, 2/4 wrist flexors, 3/4 finger flexors, 2/4 plantar flexors LUE: 4+/5 proximal to distal LLE: HF, KE 4/5, ADF/PF 4+/5 Skin: Skin is warm and dry. Intact.     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    1. 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  Cont HEP  Previously receiving Botox, changed to dysport:   Right Med biceps: 150U   Right FCR: 200U, will increase to 300U   Right FCU: 200U   Right FDS: 100U, will increase to 200U   Right Med Gastroc: 300U   Right Lat Gastroc: 300U   Will increase Baclofen to 30mg  TID  Repeat injection in 6 weeks

## 2018-02-20 ENCOUNTER — Other Ambulatory Visit: Payer: Self-pay | Admitting: Physical Medicine & Rehabilitation

## 2018-03-02 ENCOUNTER — Ambulatory Visit: Payer: Medicaid Other | Admitting: Physical Medicine & Rehabilitation

## 2018-03-17 ENCOUNTER — Encounter: Payer: Self-pay | Admitting: Physical Medicine & Rehabilitation

## 2018-03-17 ENCOUNTER — Encounter: Payer: Medicaid Other | Attending: Physical Medicine & Rehabilitation | Admitting: Physical Medicine & Rehabilitation

## 2018-03-17 ENCOUNTER — Other Ambulatory Visit: Payer: Self-pay

## 2018-03-17 VITALS — BP 105/79 | HR 113 | Ht 61.0 in | Wt 161.0 lb

## 2018-03-17 DIAGNOSIS — Z86718 Personal history of other venous thrombosis and embolism: Secondary | ICD-10-CM | POA: Diagnosis not present

## 2018-03-17 DIAGNOSIS — F209 Schizophrenia, unspecified: Secondary | ICD-10-CM | POA: Diagnosis not present

## 2018-03-17 DIAGNOSIS — G8193 Hemiplegia, unspecified affecting right nondominant side: Secondary | ICD-10-CM | POA: Diagnosis not present

## 2018-03-17 DIAGNOSIS — G8113 Spastic hemiplegia affecting right nondominant side: Secondary | ICD-10-CM | POA: Diagnosis not present

## 2018-03-17 DIAGNOSIS — Z96641 Presence of right artificial hip joint: Secondary | ICD-10-CM | POA: Diagnosis not present

## 2018-03-17 DIAGNOSIS — G802 Spastic hemiplegic cerebral palsy: Secondary | ICD-10-CM

## 2018-03-17 DIAGNOSIS — G2119 Other drug induced secondary parkinsonism: Secondary | ICD-10-CM | POA: Insufficient documentation

## 2018-03-17 NOTE — Progress Notes (Signed)
Dysport: Procedure Note Patient Name: Hannah DowseLatoya D Kennedy DOB: 1986/08/24 MRN: [id number]  Date: 03/17/18  Procedure: Botulinum toxin administration Guidance: EMG Diagnosis: Spatic right nondominant hemiparesis Attending: Maryla MorrowAnkit Patel, MD   Informed consent: Risks, benefits & options of the procedure are explained to the patient (and/or family). The patient elects to proceed with procedure. Risks include but are not limited to weakness, respiratory distress, dry mouth, ptosis, antibody formation, worsening of some areas of function. Benefits include decreased abnormal muscle tone, improved hygiene and positioning, decreased skin breakdown and, in some cases, decreased pain. Options include conservative management with oral antispasticity agents, phenol chemodenervation of nerve or at motor nerve branches. More invasive options include intrathecal balcofen adminstration for appropriate candidates. Surgical options may include tendon lengthening or transposition or, rarely, dorsal rhizotomy.   History/Physical Examination: 32 y.o. 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   mAS  Right elbow flexors: 1+/4   Right wrist flexors 3/4   Right finger flexors 2/4   Right plantar flexors 1+/4 Previous Treatments: Therapy/Range of motion/Medications Indication for guidance: Target active muscules  Procedure: Botulinum toxin was mixed with preservative free saline with a dilution of 0.5cc to 100 units. Targeted limb and muscles were identified. The skin was prepped with alcohol swabs and placement of needle tip in targeted muscle was confirmed using appropriate guidance. Prior to injection, positioning of needle tip outside of blood vessel was determined by pulling back on syringe plunger.  MUSCLE UNITS Right Med biceps: 200U Right FCR: 300U Right FCU: 200U Right FDS: 200U Right FDP:  100U  Right Med Gastroc: 250U Right Lat Gastroc: 250U  Total units used: 1500 Units Complications: None  Plan: Follow up in 6 weeks  Ankit Anil Patel 11:06 AM

## 2018-04-18 ENCOUNTER — Other Ambulatory Visit: Payer: Self-pay | Admitting: Nurse Practitioner

## 2018-04-18 DIAGNOSIS — R51 Headache: Principal | ICD-10-CM

## 2018-04-18 DIAGNOSIS — R519 Headache, unspecified: Secondary | ICD-10-CM

## 2018-04-28 ENCOUNTER — Encounter: Payer: Medicaid Other | Admitting: Physical Medicine & Rehabilitation

## 2018-04-29 ENCOUNTER — Other Ambulatory Visit: Payer: Self-pay | Admitting: Nurse Practitioner

## 2018-04-29 ENCOUNTER — Ambulatory Visit
Admission: RE | Admit: 2018-04-29 | Discharge: 2018-04-29 | Disposition: A | Discharge status: Home / Self Care | Payer: Medicaid Other | Source: Ambulatory Visit | Attending: Nurse Practitioner | Admitting: Nurse Practitioner

## 2018-04-29 DIAGNOSIS — R519 Headache, unspecified: Secondary | ICD-10-CM

## 2018-04-29 DIAGNOSIS — Z982 Presence of cerebrospinal fluid drainage device: Secondary | ICD-10-CM | POA: Insufficient documentation

## 2018-04-29 DIAGNOSIS — R51 Headache: Secondary | ICD-10-CM | POA: Diagnosis not present

## 2018-04-29 DIAGNOSIS — R9089 Other abnormal findings on diagnostic imaging of central nervous system: Secondary | ICD-10-CM | POA: Insufficient documentation

## 2018-04-29 MED ORDER — GADOBENATE DIMEGLUMINE 529 MG/ML IV SOLN: 15.0000 mL | Freq: Once | INTRAVENOUS | Status: DC | PRN

## 2018-05-05 ENCOUNTER — Encounter: Payer: Medicaid Other | Attending: Physical Medicine & Rehabilitation | Admitting: Physical Medicine & Rehabilitation

## 2018-05-05 ENCOUNTER — Encounter: Payer: Self-pay | Admitting: Physical Medicine & Rehabilitation

## 2018-05-05 VITALS — BP 111/79 | HR 119 | Ht 61.0 in | Wt 156.4 lb

## 2018-05-05 DIAGNOSIS — G8110 Spastic hemiplegia affecting unspecified side: Secondary | ICD-10-CM

## 2018-05-05 DIAGNOSIS — G8193 Hemiplegia, unspecified affecting right nondominant side: Secondary | ICD-10-CM | POA: Insufficient documentation

## 2018-05-05 DIAGNOSIS — R269 Unspecified abnormalities of gait and mobility: Secondary | ICD-10-CM

## 2018-05-05 DIAGNOSIS — F209 Schizophrenia, unspecified: Secondary | ICD-10-CM | POA: Diagnosis not present

## 2018-05-05 DIAGNOSIS — Z86718 Personal history of other venous thrombosis and embolism: Secondary | ICD-10-CM | POA: Insufficient documentation

## 2018-05-05 DIAGNOSIS — G2119 Other drug induced secondary parkinsonism: Secondary | ICD-10-CM | POA: Diagnosis not present

## 2018-05-05 DIAGNOSIS — G802 Spastic hemiplegic cerebral palsy: Secondary | ICD-10-CM | POA: Diagnosis not present

## 2018-05-05 DIAGNOSIS — G8113 Spastic hemiplegia affecting right nondominant side: Secondary | ICD-10-CM

## 2018-05-05 DIAGNOSIS — Z96641 Presence of right artificial hip joint: Secondary | ICD-10-CM | POA: Insufficient documentation

## 2018-05-05 NOTE — Progress Notes (Signed)
Subjective:    Patient ID: Hannah Kennedy, female    DOB: 01-14-1986, 32 y.o.   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    Initially stated: Caregiver provides majority of history. She is staying at the group home during week and grand mother's today.  She presents for hospital follow up.  Since last visit, she saw Ortho and was discharged. She saw PCP as well. She is minimal pain. She completed PT and is no longer using assistive device.  Denies falls.   Last clinic visit 03/17/18. At that time, she had Dysport injection. Caregiver present, who provides some of history.  Relatively poor historian. She had benefit with the injection.    Pain Inventory Average Pain 0 Pain Right Now 0 My pain is no pain  In the last 24 hours, has pain interfered with the following? General activity 5 Relation with others 10 Enjoyment of life 10 What TIME of day is your pain at its worst? no pain Sleep (in general) Good  Pain is worse with: no pain Pain improves with: no pain Relief from Meds: no pain  Mobility walk without assistance  Function disabled: date disabled .  Neuro/Psych No problems in this area  Prior Studies Any changes since last visit?  no  Physicians involved in your care Any changes since last visit?  no   Family History  Family history unknown: Yes   Social History   Socioeconomic History  . Marital status: Single    Spouse name: Not on file  . Number of children: Not on file  . Years of education: Not on file  . Highest education level: Not on file  Occupational History  . Not on file  Social Needs  . Financial resource strain: Not on file  . Food insecurity:    Worry: Not on file    Inability: Not on file  . Transportation needs:    Medical: Not on file    Non-medical: Not on file  Tobacco Use  .  Smoking status: Never Smoker  . Smokeless tobacco: Never Used  Substance and Sexual Activity  . Alcohol use: No  . Drug use: No  . Sexual activity: Not on file  Lifestyle  . Physical activity:    Days per week: Not on file    Minutes per session: Not on file  . Stress: Not on file  Relationships  . Social connections:    Talks on phone: Not on file    Gets together: Not on file    Attends religious service: Not on file    Active member of club or organization: Not on file    Attends meetings of clubs or organizations: Not on file    Relationship status: Not on file  Other Topics Concern  . Not on file  Social History Narrative  . Not on file   Past Surgical History:  Procedure Laterality Date  . CSF SHUNT    . IVC FILTER INSERTION    . IVC FILTER INSERTION N/A 01/13/2017   Procedure: IVC Filter Insertion;  Surgeon: Annice NeedyJason S Dew, MD;  Location: [facility];  Service: Cardiovascular;  Laterality: N/A;  . IVC FILTER REMOVAL N/A 06/16/2017   Procedure: IVC Filter Removal;  Surgeon: Annice Needyew, Jason S, MD;  Location: [facility];  Service: Cardiovascular;  Laterality: N/A;  .  TOTAL HIP ARTHROPLASTY Right 01/18/2017   Procedure: TOTAL HIP ARTHROPLASTY ANTERIOR APPROACH;  Surgeon: Kennedy Bucker, MD;  Location: [facility];  Service: Orthopedics;  Laterality: Right;   Past Medical History:  Diagnosis Date  . Arthritis   . Cerebral palsy (HCC)    with hydrocephalus  . Chronic anterior uveitis   . Depression   . GERD (gastroesophageal reflux disease)   . Glaucoma   . Hemiparesis (HCC)    right side  . 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 Pulmonary emboli (HCC)   . Schizophrenia (HCC)   . Vitamin B 12 deficiency    BP 111/79   Pulse (!) 119   Ht 5\' 1"  (1.549 m)   Wt 156 lb 6.4 oz (70.9 kg)   SpO2 94%   BMI 29.55 kg/m   Opioid Risk Score:   Fall Risk Score:  `1  Depression screen PHQ 2/9  Depression screen PHQ 2/9 03/17/2018  Decreased Interest 0  Down,  Depressed, Hopeless 0  PHQ - 2 Score 0    Review of Systems  HENT: Negative.   Eyes: Negative.   Respiratory: Negative.   Gastrointestinal: Negative.   Endocrine: Negative.   Genitourinary: Negative.   Musculoskeletal: Positive for gait problem.       Spasms   Skin: Negative.   Allergic/Immunologic: Negative.   Neurological: Positive for speech difficulty.  Hematological: Negative.   Psychiatric/Behavioral: Positive for confusion.  All other systems reviewed and are negative.     Objective:   Physical Exam Constitutional: She appears well-developed and well-nourished.  HENT: Normocephalic and atraumatic.  Eyes: EOMI. No discharge.  Cardiovascular: RRR. No JVD. Respiratory: Effort normal and breath sounds normal.  GI: Soft. Bowel sounds are normal.  Musculoskeletal: No edema. No tenderness. Scissoring gait.  RLE Genuvalgum Neurological: She is somnolent. Motor:  RLE: HF 4/5, KE 4/5, 2/5 ADF/PF.  RUE 4/5 within available range mAS: 1/4 elbow flexors, 1+/4 wrist flexors, 1/4 finger flexors, 1/4 plantar flexors Skin: Skin is warm and dry. Intact.     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    1. 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  Cont HEP  Previously receiving Botox, changed to dysport:   Right Med biceps: 200U   Right FCR: 300U   Right FCU: 200U   Right FDS: 200U   Right FDU: 100U    Right Med Gastroc: 250U   Right Lat Gastroc: 250U   Will maintain current dose   Cont Baclofen to 30mg  TID  Repeat injection in 6 weeks

## 2018-06-21 ENCOUNTER — Encounter: Payer: Medicaid Other | Admitting: Physical Medicine & Rehabilitation

## 2018-06-30 ENCOUNTER — Encounter: Payer: Self-pay | Admitting: Physical Medicine & Rehabilitation

## 2018-06-30 ENCOUNTER — Encounter: Payer: Medicaid Other | Attending: Physical Medicine & Rehabilitation | Admitting: Physical Medicine & Rehabilitation

## 2018-06-30 VITALS — BP 102/73 | HR 113 | Ht 62.0 in | Wt 156.0 lb

## 2018-06-30 DIAGNOSIS — G802 Spastic hemiplegic cerebral palsy: Secondary | ICD-10-CM

## 2018-06-30 DIAGNOSIS — G2119 Other drug induced secondary parkinsonism: Secondary | ICD-10-CM | POA: Diagnosis not present

## 2018-06-30 DIAGNOSIS — Z86718 Personal history of other venous thrombosis and embolism: Secondary | ICD-10-CM | POA: Insufficient documentation

## 2018-06-30 DIAGNOSIS — F209 Schizophrenia, unspecified: Secondary | ICD-10-CM | POA: Diagnosis not present

## 2018-06-30 DIAGNOSIS — G8193 Hemiplegia, unspecified affecting right nondominant side: Secondary | ICD-10-CM | POA: Insufficient documentation

## 2018-06-30 DIAGNOSIS — Z96641 Presence of right artificial hip joint: Secondary | ICD-10-CM | POA: Insufficient documentation

## 2018-06-30 NOTE — Progress Notes (Addendum)
Dysport: Procedure Note Patient Name: Hannah Kennedy DOB: [birth year] MRN: [id number]  Date: 06/30/18  Procedure: Botulinum toxin administration Guidance: EMG Diagnosis: Spatic right nondominant hemiparesis Attending: Maryla MorrowAnkit Patel, MD   Informed consent: Risks, benefits & options of the procedure are explained to the patient (and/or family). The patient elects to proceed with procedure. Risks include but are not limited to weakness, respiratory distress, dry mouth, ptosis, antibody formation, worsening of some areas of function. Benefits include decreased abnormal muscle tone, improved hygiene and positioning, decreased skin breakdown and, in some cases, decreased pain. Options include conservative management with oral antispasticity agents, phenol chemodenervation of nerve or at motor nerve branches. More invasive options include intrathecal balcofen adminstration for appropriate candidates. Surgical options may include tendon lengthening or transposition or, rarely, dorsal rhizotomy.   History/Physical Examination: 32 y.o. 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 Patient more listless and agitated today.  mAS  Right elbow flexors: 2/4   Right wrist flexors 2/4   Right finger flexors 1/4   Right plantar flexors 1/4 Previous Treatments: Therapy/Range of motion/Medications Indication for guidance: Target active muscules  Procedure: Botulinum toxin was mixed with preservative free saline with a dilution of 0.5cc to 100 units. Targeted limb and muscles were identified. The skin was prepped with alcohol swabs and placement of needle tip in targeted muscle was confirmed using appropriate guidance. Prior to injection, positioning of needle tip outside of blood vessel was determined by pulling back on syringe plunger.  MUSCLE UNITS Right Med biceps: 200U Right FCR: 300U Right  FCU: 200U Right FDS: 200U Right FDP: 100U  Right Med Gastroc: 250U Right Lat Gastroc: 250U  Total units used: 1500 Units Complications: None, although patient was moving more during procedure and required redirection from grandmother.  Plan: Follow up in 6 weeks  Ankit Anil Patel 11:15 AM

## 2018-07-18 IMAGING — XA DG HIP (WITH PELVIS) OPERATIVE*R*
5 series · 13 of 13 positions shown · non-contrast
Comparison: MR pelvis of 12/20/2016

CLINICAL DATA: Right hip replacement

EXAM:
OPERATIVE right HIP (WITH PELVIS IF PERFORMED) 7 VIEWS
TECHNIQUE: Fluoroscopic spot image(s) were submitted for interpretation
post-operatively.

[Series 1: ortho standard · 1 of 1 slices shown (1 of 5)]
[im 1/1]
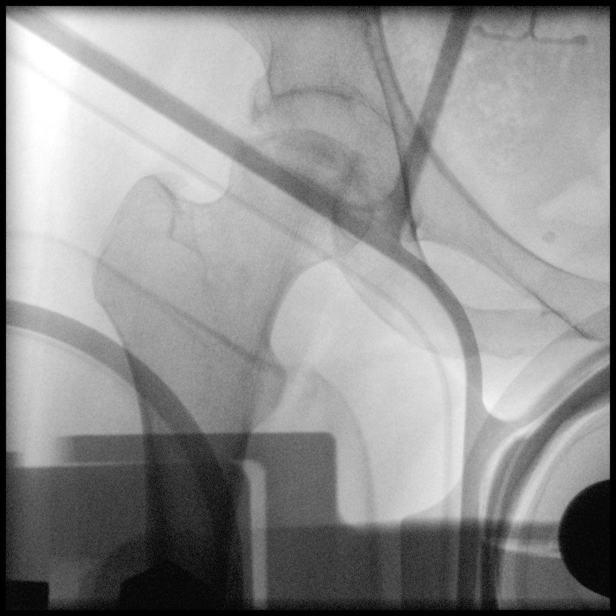

[Series 3: ortho standard · 1 of 1 slices shown (2 of 5)]
[im 1/1]
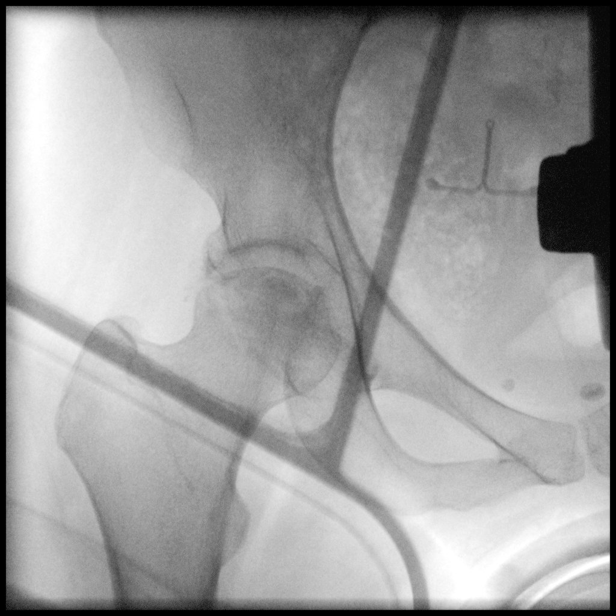

[Series 5: ortho standard · 1 of 1 slices shown (3 of 5)]
[im 1/1]
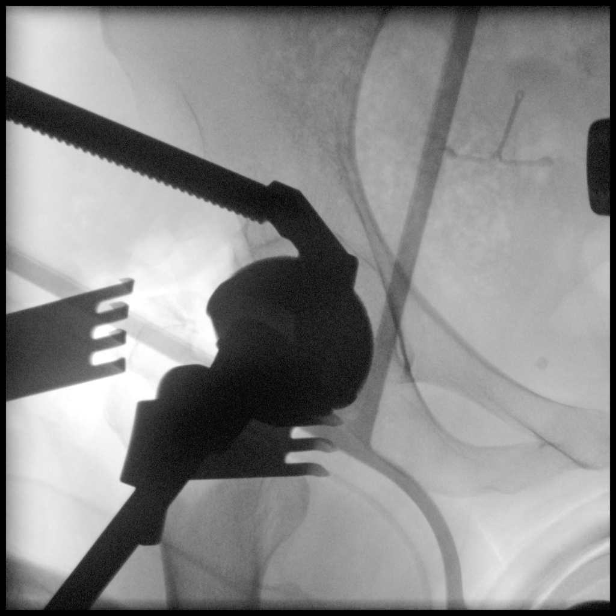

[Series 6: ortho standard · 2 acquisitions, 5 frames shown (4 of 5)]
[im 1/2]
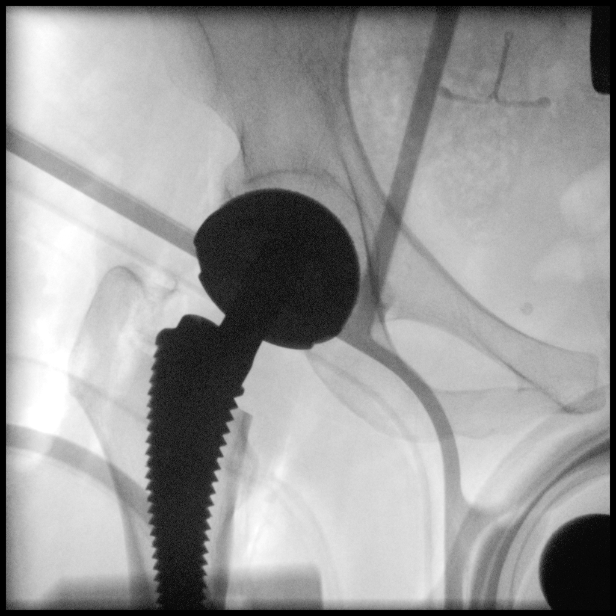
[im 2/2]
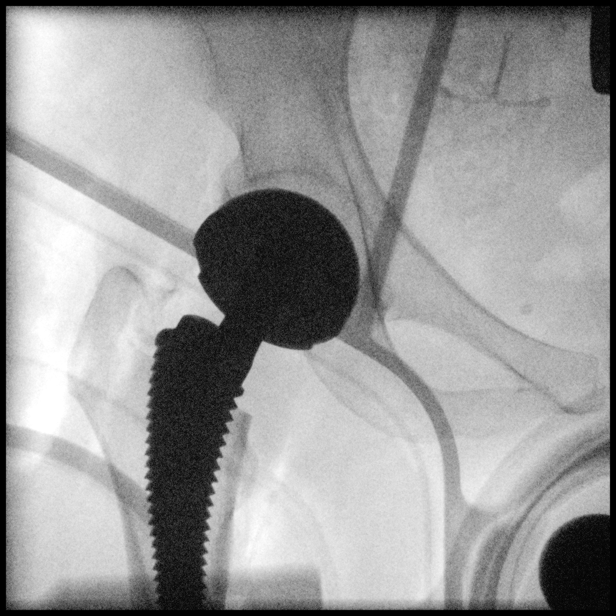
[im 2/2]
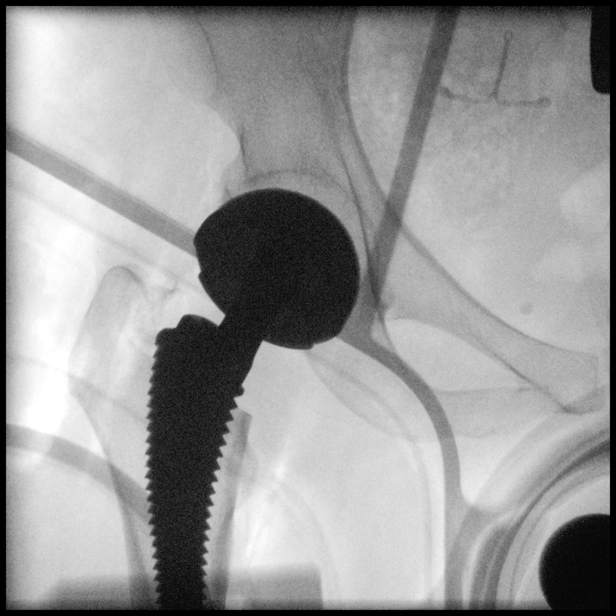
[im 2/2]
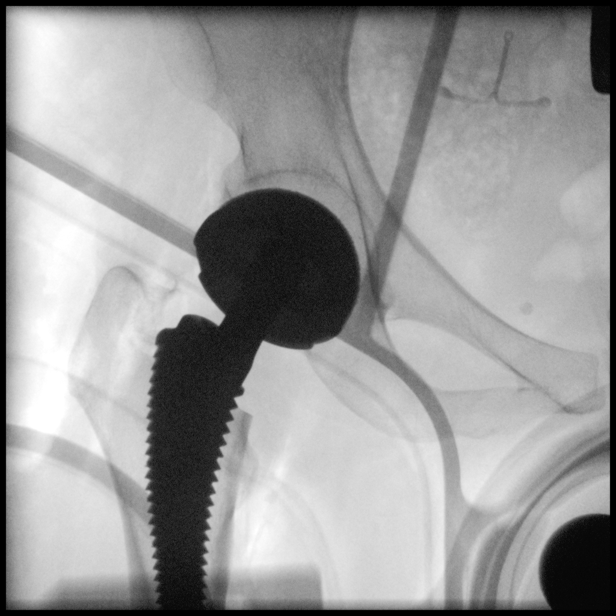
[im 2/2]
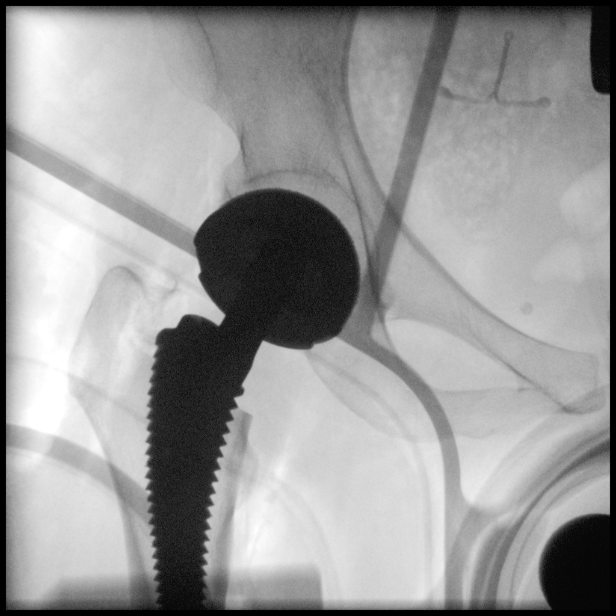

[Series 8: ortho standard · 2 acquisitions, 5 frames shown (5 of 5)]
[im 1/2]
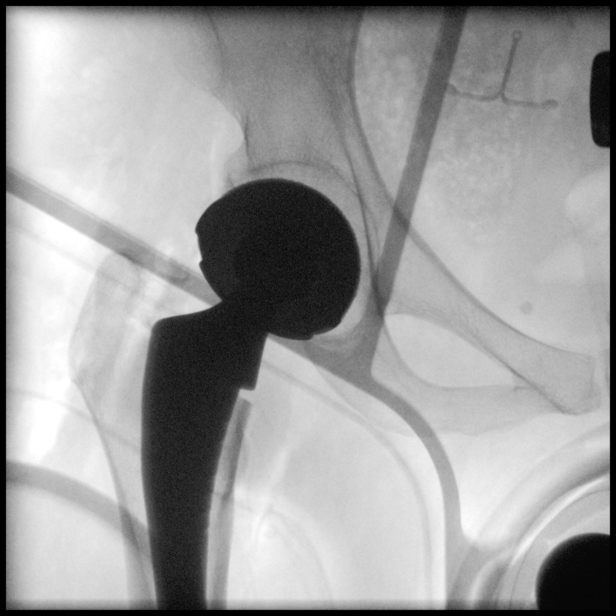
[im 2/2]
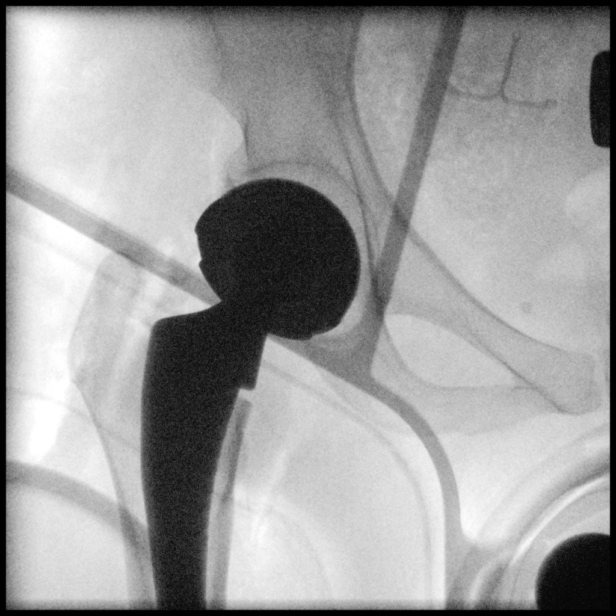
[im 2/2]
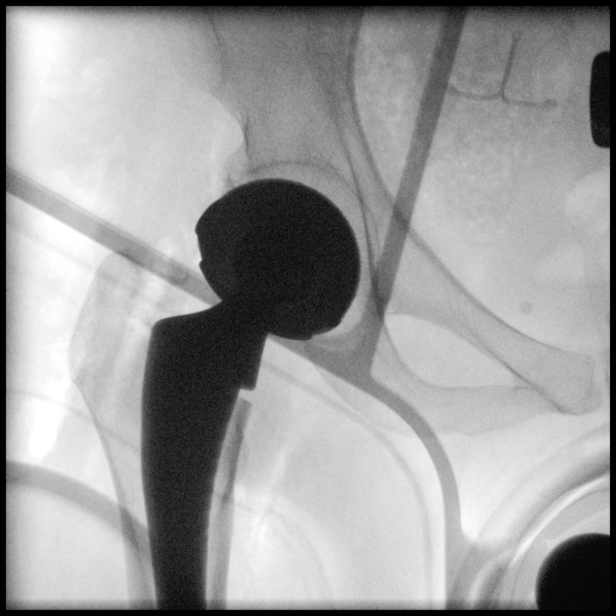
[im 2/2]
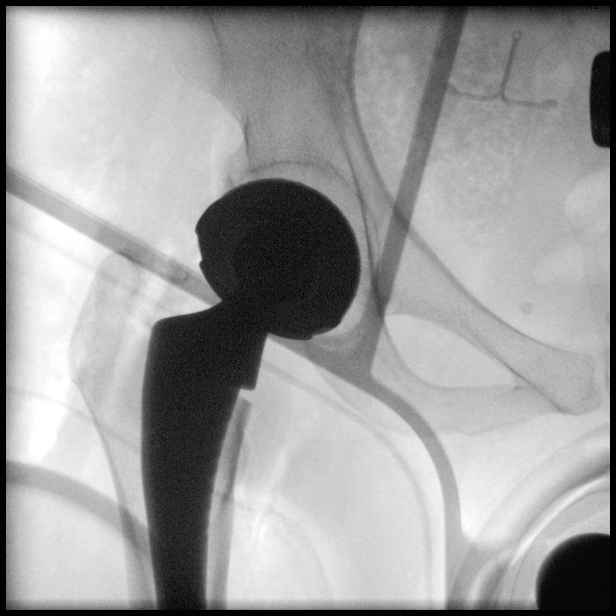
[im 2/2]
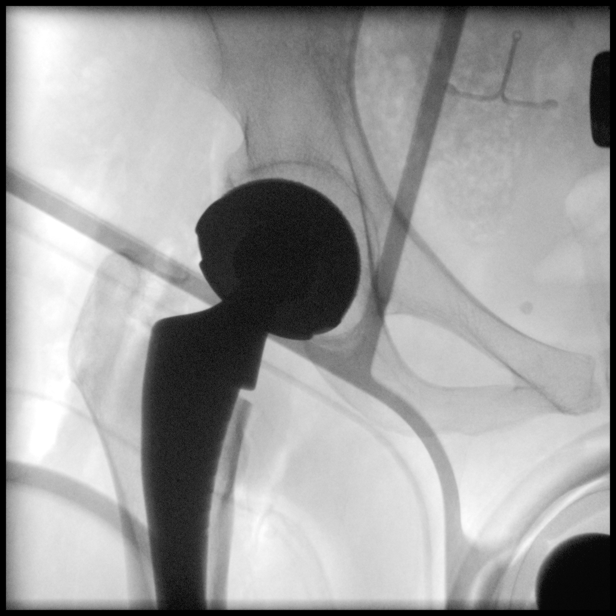

[13 of 13 positions shown; findings below may reference images not displayed]

FINDINGS: C-arm spot films were obtained showing changes of avascular necrosis
involving the right femoral head. The components of the right hip
replacement appear to be in good position with no complicating
features noted.
IMPRESSION: Right hip replacement performed without complicating features. The
entire femoral stem is not included on the images obtained.

## 2018-08-11 ENCOUNTER — Encounter: Payer: Self-pay | Admitting: Physical Medicine & Rehabilitation

## 2018-08-11 ENCOUNTER — Encounter: Payer: Medicaid Other | Attending: Physical Medicine & Rehabilitation | Admitting: Physical Medicine & Rehabilitation

## 2018-08-11 VITALS — BP 109/70 | HR 114 | Ht 62.0 in | Wt 160.0 lb

## 2018-08-11 DIAGNOSIS — G8110 Spastic hemiplegia affecting unspecified side: Secondary | ICD-10-CM

## 2018-08-11 DIAGNOSIS — Z96641 Presence of right artificial hip joint: Secondary | ICD-10-CM | POA: Diagnosis not present

## 2018-08-11 DIAGNOSIS — G802 Spastic hemiplegic cerebral palsy: Secondary | ICD-10-CM

## 2018-08-11 DIAGNOSIS — Z86718 Personal history of other venous thrombosis and embolism: Secondary | ICD-10-CM | POA: Diagnosis not present

## 2018-08-11 DIAGNOSIS — F209 Schizophrenia, unspecified: Secondary | ICD-10-CM | POA: Diagnosis not present

## 2018-08-11 DIAGNOSIS — G8193 Hemiplegia, unspecified affecting right nondominant side: Secondary | ICD-10-CM | POA: Insufficient documentation

## 2018-08-11 DIAGNOSIS — G8113 Spastic hemiplegia affecting right nondominant side: Secondary | ICD-10-CM | POA: Diagnosis not present

## 2018-08-11 DIAGNOSIS — G2119 Other drug induced secondary parkinsonism: Secondary | ICD-10-CM | POA: Insufficient documentation

## 2018-08-11 MED ORDER — BACLOFEN 20 MG PO TABS
30.0000 mg | ORAL_TABLET | Freq: Three times a day (TID) | ORAL | 1 refills | Status: AC
Start: 2018-08-11 — End: 2018-09-10

## 2018-08-11 NOTE — Progress Notes (Addendum)
Subjective:    Patient ID: Hannah Kennedy, female    DOB: Dec 30, 1985, 32 y.o.   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    Initially stated: Caregiver provides majority of history. She is staying at the group home during week and grand mother's today.  She presents for hospital follow up.  Since last visit, she saw Ortho and was discharged. She saw PCP as well. She is minimal pain. She completed PT and is no longer using assistive device.  Denies falls.   Last clinic visit 06/30/18. At that time, she had Dysport injection. Caregiver present, who provides some of history.  Relatively poor historian. She had benefits with the injection.  Caregiver states patient is doing much better.   Pain Inventory Average Pain 6 Pain Right Now 4 My pain is no pain  In the last 24 hours, has pain interfered with the following? General activity 5 Relation with others 6 Enjoyment of life 4 What TIME of day is your pain at its worst? no pain Sleep (in general) Good  Pain is worse with: walking Pain improves with: no pain Relief from Meds: no pain  Mobility walk without assistance  Function disabled: date disabled .  Neuro/Psych No problems in this area  Prior Studies Any changes since last visit?  no  Physicians involved in your care Any changes since last visit?  no   Family History  Family history unknown: Yes   Social History   Socioeconomic History  . Marital status: Single    Spouse name: Not on file  . Number of children: Not on file  . Years of education: Not on file  . Highest education level: Not on file  Occupational History  . Not on file  Social Needs  . Financial resource strain: Not on file  . Food insecurity:    Worry: Not on file    Inability: Not on file  . Transportation needs:    Medical: Not on file   Non-medical: Not on file  Tobacco Use  . Smoking status: Never Smoker  . Smokeless tobacco: Never Used  Substance and Sexual Activity  . Alcohol use: No  . Drug use: No  . Sexual activity: Not on file  Lifestyle  . Physical activity:    Days per week: Not on file    Minutes per session: Not on file  . Stress: Not on file  Relationships  . Social connections:    Talks on phone: Not on file    Gets together: Not on file    Attends religious service: Not on file    Active member of club or organization: Not on file    Attends meetings of clubs or organizations: Not on file    Relationship status: Not on file  Other Topics Concern  . Not on file  Social History Narrative  . Not on file   Past Surgical History:  Procedure Laterality Date  . CSF SHUNT    . IVC FILTER INSERTION    . IVC FILTER INSERTION N/A 01/13/2017   Procedure: IVC Filter Insertion;  Surgeon: Annice Needy, MD;  Location: [facility];  Service: Cardiovascular;  Laterality: N/A;  . IVC FILTER REMOVAL N/A 06/16/2017   Procedure: IVC Filter Removal;  Surgeon: Annice Needy, MD;  Location: [facility];  Service: Cardiovascular;  Laterality: N/A;  . TOTAL HIP ARTHROPLASTY Right 01/18/2017   Procedure: TOTAL HIP ARTHROPLASTY ANTERIOR APPROACH;  Surgeon: Kennedy BuckerMichael Menz, MD;  Location: [facility];  Service: Orthopedics;  Laterality: Right;   Past Medical History:  Diagnosis Date  . Arthritis   . Cerebral palsy (HCC)    with hydrocephalus  . Chronic anterior uveitis   . Depression   . GERD (gastroesophageal reflux disease)   . Glaucoma   . Hemiparesis (HCC)    right side  . 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 Pulmonary emboli (HCC)   . Schizophrenia (HCC)   . Vitamin B 12 deficiency    BP 109/70   Pulse (!) 114   Ht 5\' 2"  (1.575 m)   Wt 160 lb (72.6 kg)   SpO2 94%   BMI 29.26 kg/m   Opioid Risk Score:   Fall Risk Score:  `1  Depression screen PHQ 2/9  Depression screen PHQ 2/9 03/17/2018    Decreased Interest 0  Down, Depressed, Hopeless 0  PHQ - 2 Score 0    Review of Systems  HENT: Negative.   Eyes: Negative.   Respiratory: Negative.   Gastrointestinal: Negative.   Endocrine: Negative.   Genitourinary: Negative.   Musculoskeletal: Positive for gait problem and myalgias.       Spasms   Skin: Negative.   Allergic/Immunologic: Negative.   Neurological: Positive for speech difficulty.  Hematological: Negative.   Psychiatric/Behavioral: Positive for confusion.  All other systems reviewed and are negative.     Objective:   Physical Exam Constitutional: She appears well-developed and well-nourished.  HENT: Normocephalic and atraumatic.  Eyes: EOMI. No discharge.  Cardiovascular: RRR. No JVD. Respiratory: Effort normal and breath sounds normal.  GI: Nondistended. Bowel sounds are normal.  Musculoskeletal: No edema. No tenderness. Scissoring gait.  RLE Genuvalgum Neurological: She is somnolent. Motor:  RLE: HF 4/5, KE 4/5, 2/5 ADF/PF.  RUE 4/5 within available range mAS: 1+/4 elbow flexors, 1/4 wrist flexors, 2/4 finger flexors, 1+/4 plantar flexors Skin: Skin is warm and dry. Intact.     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    1. 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  Cont HEP  Cont dysport:   Right Med biceps: 200U   Right FCR: 300U   Right FCU: 200U   Right FDS: 200U   Right FDU: 100U    Right Med Gastroc: 250U   Right Lat Gastroc: 250U   Cont Baclofen to 30mg  TID  Repeat injection in 6 weeks

## 2018-09-18 ENCOUNTER — Telehealth: Payer: Self-pay | Admitting: Physical Medicine & Rehabilitation

## 2018-09-18 NOTE — Telephone Encounter (Signed)
Rescheduled appt to 11/03/18 due to doc out of office

## 2018-10-25 ENCOUNTER — Ambulatory Visit: Payer: Medicaid Other | Admitting: Physical Medicine & Rehabilitation

## 2018-11-01 ENCOUNTER — Ambulatory Visit: Payer: Medicaid Other | Admitting: Physical Medicine & Rehabilitation

## 2018-11-03 ENCOUNTER — Encounter: Payer: Medicaid Other | Admitting: Physical Medicine & Rehabilitation

## 2018-12-08 ENCOUNTER — Ambulatory Visit: Payer: Medicaid Other | Admitting: Physical Medicine & Rehabilitation

## 2018-12-15 ENCOUNTER — Encounter: Payer: Self-pay | Admitting: Physical Medicine & Rehabilitation

## 2018-12-15 ENCOUNTER — Encounter: Payer: Medicaid Other | Attending: Physical Medicine & Rehabilitation | Admitting: Physical Medicine & Rehabilitation

## 2018-12-15 VITALS — BP 106/70 | HR 117 | Ht 62.0 in | Wt 160.0 lb

## 2018-12-15 DIAGNOSIS — G2119 Other drug induced secondary parkinsonism: Secondary | ICD-10-CM | POA: Diagnosis not present

## 2018-12-15 DIAGNOSIS — G8113 Spastic hemiplegia affecting right nondominant side: Secondary | ICD-10-CM | POA: Diagnosis present

## 2018-12-15 DIAGNOSIS — Z86718 Personal history of other venous thrombosis and embolism: Secondary | ICD-10-CM | POA: Diagnosis not present

## 2018-12-15 DIAGNOSIS — F209 Schizophrenia, unspecified: Secondary | ICD-10-CM | POA: Insufficient documentation

## 2018-12-15 DIAGNOSIS — G802 Spastic hemiplegic cerebral palsy: Secondary | ICD-10-CM

## 2018-12-15 DIAGNOSIS — G809 Cerebral palsy, unspecified: Secondary | ICD-10-CM | POA: Insufficient documentation

## 2018-12-15 DIAGNOSIS — Z86711 Personal history of pulmonary embolism: Secondary | ICD-10-CM | POA: Insufficient documentation

## 2018-12-15 DIAGNOSIS — Z96641 Presence of right artificial hip joint: Secondary | ICD-10-CM | POA: Diagnosis not present

## 2018-12-15 DIAGNOSIS — T50905A Adverse effect of unspecified drugs, medicaments and biological substances, initial encounter: Secondary | ICD-10-CM | POA: Insufficient documentation

## 2018-12-15 NOTE — Progress Notes (Signed)
Dysport: Procedure Note Patient Name: Hannah Kennedy DOB: Sep 23, 1986 MRN: [id number]  Date: 12/15/2018  Procedure: Botulinum toxin administration Guidance: EMG Diagnosis: Spatic right nondominant hemiparesis Attending: [name], MD   Informed consent: Risks, benefits & options of the procedure are explained to the patient (and/or family). The patient elects to proceed with procedure. Risks include but are not limited to weakness, respiratory distress, dry mouth, ptosis, antibody formation, worsening of some areas of function. Benefits include decreased abnormal muscle tone, improved hygiene and positioning, decreased skin breakdown and, in some cases, decreased pain. Options include conservative management with oral antispasticity agents, phenol chemodenervation of nerve or at motor nerve branches. More invasive options include intrathecal balcofen adminstration for appropriate candidates. Surgical options may include tendon lengthening or transposition or, rarely, dorsal rhizotomy.   History/Physical Examination: 33 y.o. 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 Patient more listless and agitated today.  mAS  Right elbow flexors: 2/4   Right wrist flexors 2/4   Right finger flexors 1/4   Right plantar flexors 1/4 Previous Treatments: Therapy/Range of motion/Medications Indication for guidance: Target active muscules  Procedure: Botulinum toxin was mixed with preservative free saline with a dilution of 0.5cc to 100 units. Targeted limb and muscles were identified. The skin was prepped with alcohol swabs and placement of needle tip in targeted muscle was confirmed using appropriate guidance. Prior to injection, positioning of needle tip outside of blood vessel was determined by pulling back on syringe plunger.  MUSCLE UNITS Right Med biceps: 200U Right FCR:  300U Right FCU: 200U Right FDS: 200U Right FDP: 100U  Right Med Gastroc: 250U Right Lat Gastroc: 250U  Total units used: 1500 Units Complications: None.  Plan: Follow up in 6 weeks   Anil  11:05 AM

## 2019-01-26 ENCOUNTER — Encounter: Payer: Medicaid Other | Attending: Physical Medicine & Rehabilitation | Admitting: Physical Medicine & Rehabilitation

## 2019-01-26 ENCOUNTER — Encounter: Payer: Self-pay | Admitting: Physical Medicine & Rehabilitation

## 2019-01-26 VITALS — BP 104/79 | HR 128 | Ht 62.0 in | Wt 160.0 lb

## 2019-01-26 DIAGNOSIS — Z86718 Personal history of other venous thrombosis and embolism: Secondary | ICD-10-CM | POA: Insufficient documentation

## 2019-01-26 DIAGNOSIS — G802 Spastic hemiplegic cerebral palsy: Secondary | ICD-10-CM | POA: Diagnosis not present

## 2019-01-26 DIAGNOSIS — G2119 Other drug induced secondary parkinsonism: Secondary | ICD-10-CM | POA: Diagnosis not present

## 2019-01-26 DIAGNOSIS — R000 Tachycardia, unspecified: Secondary | ICD-10-CM

## 2019-01-26 DIAGNOSIS — R269 Unspecified abnormalities of gait and mobility: Secondary | ICD-10-CM

## 2019-01-26 DIAGNOSIS — F209 Schizophrenia, unspecified: Secondary | ICD-10-CM | POA: Insufficient documentation

## 2019-01-26 DIAGNOSIS — G8113 Spastic hemiplegia affecting right nondominant side: Secondary | ICD-10-CM | POA: Diagnosis present

## 2019-01-26 DIAGNOSIS — G809 Cerebral palsy, unspecified: Secondary | ICD-10-CM | POA: Diagnosis not present

## 2019-01-26 DIAGNOSIS — Z86711 Personal history of pulmonary embolism: Secondary | ICD-10-CM | POA: Diagnosis not present

## 2019-01-26 DIAGNOSIS — Z96641 Presence of right artificial hip joint: Secondary | ICD-10-CM | POA: Insufficient documentation

## 2019-01-26 DIAGNOSIS — T50905A Adverse effect of unspecified drugs, medicaments and biological substances, initial encounter: Secondary | ICD-10-CM | POA: Insufficient documentation

## 2019-01-26 DIAGNOSIS — G8918 Other acute postprocedural pain: Secondary | ICD-10-CM

## 2019-01-26 DIAGNOSIS — G8110 Spastic hemiplegia affecting unspecified side: Secondary | ICD-10-CM

## 2019-01-26 NOTE — Progress Notes (Addendum)
Subjective:    Patient ID: Hannah Kennedy, female    DOB: [birth year], 33 y.o.   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    Initially stated: Caregiver provides majority of history. She is staying at the group home during week and grand mother's today.  She presents for hospital follow up.  Since last visit, she saw Ortho and was discharged. She saw PCP as well. She is minimal pain. She completed PT and is no longer using assistive device.  Denies falls.   Last clinic visit 12/15/2018. At that time, she had Dysport injection. Caregiver present, who provides some of history.  HR elevated today, printed and reviewed trend with patient. Pt states she tolerated injection without issues.  Pain Inventory Average Pain 0 Pain Right Now 0 My pain is no pain  In the last 24 hours, has pain interfered with the following? General activity 0 Relation with others 0 Enjoyment of life 0 What TIME of day is your pain at its worst? no pain Sleep (in general) Good  Pain is worse with: na Pain improves with: no pain Relief from Meds: no pain  Mobility walk without assistance  Function disabled: date disabled .  Neuro/Psych No problems in this area  Prior Studies Any changes since last visit?  no  Physicians involved in your care Any changes since last visit?  no   Family History  Family history unknown: Yes   Social History   Socioeconomic History  . Marital status: Single    Spouse name: Not on file  . Number of children: Not on file  . Years of education: Not on file  . Highest education level: Not on file  Occupational History  . Not on file  Social Needs  . Financial resource strain: Not on file  . Food insecurity:    Worry: Not on file    Inability: Not on file  . Transportation needs:    Medical: Not on file   Non-medical: Not on file  Tobacco Use  . Smoking status: Never Smoker  . Smokeless tobacco: Never Used  Substance and Sexual Activity  . Alcohol use: No  . Drug use: No  . Sexual activity: Not on file  Lifestyle  . Physical activity:    Days per week: Not on file    Minutes per session: Not on file  . Stress: Not on file  Relationships  . Social connections:    Talks on phone: Not on file    Gets together: Not on file    Attends religious service: Not on file    Active member of club or organization: Not on file    Attends meetings of clubs or organizations: Not on file    Relationship status: Not on file  Other Topics Concern  . Not on file  Social History Narrative  . Not on file   Past Surgical History:  Procedure Laterality Date  . CSF SHUNT    . IVC FILTER INSERTION    . IVC FILTER INSERTION N/A 01/13/2017   Procedure: IVC Filter Insertion;  Surgeon: Annice Needy, MD;  Location: [facility];  Service: Cardiovascular;  Laterality: N/A;  . IVC FILTER REMOVAL N/A 06/16/2017   Procedure: IVC Filter Removal;  Surgeon: Annice Needy, MD;  Location: [facility];  Service: Cardiovascular;  Laterality:  N/A;  . TOTAL HIP ARTHROPLASTY Right 01/18/2017   Procedure: TOTAL HIP ARTHROPLASTY ANTERIOR APPROACH;  Surgeon: Kennedy Bucker, MD;  Location: [facility];  Service: Orthopedics;  Laterality: Right;   Past Medical History:  Diagnosis Date  . Arthritis   . Cerebral palsy (HCC)    with hydrocephalus  . Chronic anterior uveitis   . Depression   . GERD (gastroesophageal reflux disease)   . Glaucoma   . Hemiparesis (HCC)    right side  . 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 Pulmonary emboli (HCC)   . Schizophrenia (HCC)   . Vitamin B 12 deficiency    BP 104/79   Pulse (!) 128   Ht 5\' 2"  (1.575 m)   Wt 160 lb (72.6 kg)   SpO2 96%   BMI 29.26 kg/m   Opioid Risk Score:   Fall Risk Score:  `1  Depression screen PHQ 2/9  Depression screen PHQ 2/9 03/17/2018    Decreased Interest 0  Down, Depressed, Hopeless 0  PHQ - 2 Score 0    Review of Systems  HENT: Negative.   Eyes: Negative.   Respiratory: Negative.   Gastrointestinal: Negative.   Endocrine: Negative.   Genitourinary: Negative.   Musculoskeletal: Positive for gait problem.       Spasms   Skin: Negative.   Allergic/Immunologic: Negative.   Neurological: Positive for speech difficulty.  Hematological: Negative.   Psychiatric/Behavioral: Positive for confusion.  All other systems reviewed and are negative.     Objective:   Physical Exam Constitutional: She appears well-developed and well-nourished.  HENT: Normocephalic and atraumatic.  Eyes: EOMI. No discharge.  Cardiovascular: +Tachycardia. Regular rhythm. No JVD. Respiratory: Effort normal and breath sounds normal.  GI: Nondistended. Bowel sounds are normal.  Musculoskeletal: No edema. No tenderness. Scissoring gait.  RLE Genuvalgum Neurological: She is somnolent. Motor:  RLE: HF 4/5, KE 4/5, 2/5 ADF/PF.  RUE 4/5 within available range mAS: 1/4 elbow flexors, 2/4 wrist flexors, 1/4 finger flexors, 1/4 plantar flexors Skin: Skin is warm and dry. Intact.     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    1. 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  Cont HEP  Will hold off on dysport:   Right Med biceps: 200U   Right FCR: 300U   Right FCU: 200U   Right FDS: 200U   Right FDU: 100U    Right Med Gastroc: 250U   Right Lat Gastroc: 250U  Cont Baclofen to 30mg  TID  Grandmother and patient unclear if functional benefit to injections.  Objective benefit, in terms of spasticity, however, unclear if this is enabling/improving ambulation, ADLs, etc.    Will hold off on injection and reevaluate in 3 months.   2. Tachycardia  Reviewed with patient  Follow up with PCP

## 2019-04-30 ENCOUNTER — Ambulatory Visit: Payer: Medicaid Other | Admitting: Physical Medicine & Rehabilitation

## 2019-05-07 ENCOUNTER — Ambulatory Visit: Payer: Medicaid Other | Admitting: Physical Medicine & Rehabilitation

## 2020-09-08 ENCOUNTER — Ambulatory Visit: Payer: Medicaid Other | Attending: Internal Medicine

## 2020-09-08 DIAGNOSIS — Z23 Encounter for immunization: Secondary | ICD-10-CM

## 2020-09-08 NOTE — Progress Notes (Signed)
   Covid-19 Vaccination Clinic  Name:  Hannah Kennedy    MRN: [id number] DOB: 21-Oct-1986  09/08/2020  Ms. [name] was observed post Covid-19 immunization for 30 minutes based on pre-vaccination screening without incident. She was provided with Vaccine Information Sheet and instruction to access the V-Safe system.   [name] was instructed to call 911 with any severe reactions post vaccine: Marland Kitchen Difficulty breathing  . Swelling of face and throat  . A fast heartbeat  . A bad rash all over body  . Dizziness and weakness

## 2021-11-12 ENCOUNTER — Other Ambulatory Visit: Payer: Self-pay | Admitting: Otolaryngology

## 2021-11-12 DIAGNOSIS — J3800 Paralysis of vocal cords and larynx, unspecified: Secondary | ICD-10-CM

## 2021-12-04 ENCOUNTER — Ambulatory Visit: Admission: RE | Admit: 2021-12-04 | Payer: Medicaid Other | Source: Ambulatory Visit

## 2021-12-04 ENCOUNTER — Other Ambulatory Visit: Payer: Self-pay | Admitting: Otolaryngology

## 2021-12-04 DIAGNOSIS — J3800 Paralysis of vocal cords and larynx, unspecified: Secondary | ICD-10-CM

## 2021-12-22 ENCOUNTER — Ambulatory Visit
Admission: RE | Admit: 2021-12-22 | Discharge: 2021-12-22 | Disposition: A | Discharge status: Home / Self Care | Payer: Medicaid Other | Source: Ambulatory Visit | Attending: Otolaryngology | Admitting: Otolaryngology

## 2021-12-22 DIAGNOSIS — J3800 Paralysis of vocal cords and larynx, unspecified: Secondary | ICD-10-CM | POA: Diagnosis not present

## 2021-12-22 DIAGNOSIS — R1312 Dysphagia, oropharyngeal phase: Secondary | ICD-10-CM | POA: Insufficient documentation

## 2021-12-22 NOTE — Therapy (Signed)
[facility] [address] [place] [place], [place], [address] Phone: [phone]   Fax:     Modified Barium Swallow  Patient Details  Name: Hannah Kennedy MRN: [id number] Date of Birth: [birth year] No data recorded  Encounter Date: 12/22/2021   End of Session - 12/22/21 1426     Visit Number 1    Number of Visits 1    Date for SLP Re-Evaluation 12/22/21    SLP Start Time 1230    SLP Stop Time  1330    SLP Time Calculation (min) 60 min    Activity Tolerance Patient tolerated treatment well             Past Medical History:  Diagnosis Date   Arthritis    Cerebral palsy (HCC)    with hydrocephalus   Chronic anterior uveitis    Depression    GERD (gastroesophageal reflux disease)    Glaucoma    Hemiparesis (HCC)    right side   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)    Pulmonary emboli (HCC)    Schizophrenia (HCC)    Vitamin B 12 deficiency     Past Surgical History:  Procedure Laterality Date   CSF SHUNT     IVC FILTER INSERTION     IVC FILTER INSERTION N/A 01/13/2017   Procedure: IVC Filter Insertion;  Surgeon: Annice NeedyJason S Dew, MD;  Location: [facility];  Service: Cardiovascular;  Laterality: N/A;   IVC FILTER REMOVAL N/A 06/16/2017   Procedure: IVC Filter Removal;  Surgeon: Annice Needyew, Jason S, MD;  Location: [facility];  Service: Cardiovascular;  Laterality: N/A;   TOTAL HIP ARTHROPLASTY Right 01/18/2017   Procedure: TOTAL HIP ARTHROPLASTY ANTERIOR APPROACH;  Surgeon: Kennedy BuckerMichael Menz, MD;  Location: [facility];  Service: Orthopedics;  Laterality: Right;    There were no vitals filed for this visit.    Subjective: Patient behavior: (alertness, ability to follow instructions, etc.): alert, talkative and followed general instructions.  Chief complaint: dysphagia. Pt endorsed a dry "cough" stating it occurred at meals == the same dry cough was noted while sitting in MBS chair PRIOR TO po's being given.  Pt denied  any tx for pneumonia or chest congestion in the past 6 months. She has NOT had a Chest Xray under Imaging in her chart since 2014. She declined weight loss. Stated she ate a Regular diet at home w/ thin liquids.   OM Exam: WFL. Native dentition. Cough+. Noted mild Dysphonia -- pt has a Baseline dx of vocal cord paresis per chart. Noted also Parkinsonism d/t drugs dx per chart.    Objective:  Radiological Procedure: A videoflouroscopic evaluation of oral-preparatory, reflex initiation, and pharyngeal phases of the swallow was performed; as well as a screening of the upper esophageal phase.  POSTURE: upright VIEW: lateral COMPENSATORY STRATEGIES: lingual sweep; f/u, Dry swallow BOLUSES ADMINISTERED:  Thin Liquid: 1 tsp; 3 cup sips (single sips)  Nectar-thick Liquid: 1 tsp; 1 cup sip  Honey-thick Liquid: NT  Puree: declined  Mechanical Soft: 1 trial RESULTS OF EVALUATION: ORAL PREPARATORY PHASE: (The lips, tongue, and velum are observed for strength and coordination)       **Overall Severity Rating: MILD+. Pt immediately indicated she did not "like" the bolus trial -- less rotary mastication and more Munching behavior noted during mastication of the bolus. Bolus appeared less cohesive; piecemeal swallowing occurred. Pt did use lingual sweeping and f/u swallow independently.   SWALLOW INITIATION/REFLEX: (The reflex is  normal if triggered by the time the bolus reached the base of the tongue)  **Overall Severity Rating: MILD. Mild delay in pharyngeal swallow initiation w/ liquid boluses, both thin and Nectar consistencies. Bolus tended to spill to the Pyriform Sinuses w/ partial epiglottic inversion evident; full inversion and tight airway closure occurred after bolus spilled past the airway entrance as the swallow was completed.  PHARYNGEAL PHASE: (Pharyngeal function is normal if the bolus shows rapid, smooth, and continuous transit through the pharynx and there is no pharyngeal residue after  the swallow)  **Overall Severity Rating: [facility].   LARYNGEAL PENETRATION: (Material entering into the laryngeal inlet/vestibule but not aspirated): NONE ASPIRATION: x1 on slight amount of premature spillage of thin liquid bolus(when pt threw her head back to swallow); "I didn't like the taste of it". ESOPHAGEAL PHASE: (Screening of the upper esophagus); apparent prominent CP muscle w/ min posterior protrusion into the Esophageal space -- this did not appear to impede bolus motility or clearing through the UES or Cervical Esophagus.    ASSESSMENT: pt presented today for MBSS. She has baseline h/o Cerebral Palsy w/ hydrocephalus w/ shunt evident, Parkinsonism d/t drugs, and Schizophrenia, per chart notes. She resides at a Group Home w/ Caregivers. Pt endorsed eating a Regular diet w/ thin liquids -- no txs for chest congestion nor pneumonia reported(none noted in chart). Most recent CXR in chart was in 2014.   Patient presents with MILD+ oral and mild pharyngeal phase dysphagia suspect impacted by pt's cognitive and behavioral baseline. She was min impulsive in her behaviors but w/ instruction and cues, she was able to follow through w/ general aspiration precautions.   Oral phase of swallowing characterized by prolonged bolus formation and mastication w/ min delayed anterior to posterior transit. Intermittent piecemealing of boluses. Pt had immediately indicated she did not "like" the bolus trial -- less rotary mastication and more Munching behavior noted during mastication of the bolus. Bolus appeared less cohesive; piecemeal swallowing occurred. Pt did use lingual sweeping and f/u swallow independently. She demonstrated oral control/holding when instructed.   Pharyngeal swallow initiation appeared mildly delayed w/ thin and Nectar consistency boluses spilling to the level of the pyriform sinuses. Boluses tended to spill to the Pyriform Sinuses w/ partial epiglottic inversion evident; full inversion and  tight airway closure occurred after bolus spilled past the airway entrance while the swallow was completed. Despite this delay, when pt initiated a pharyngeal swallow, it was strong and swift, with adequate tongue base retraction, hyolaryngeal excursion and pharyngeal constriction. With thin liquids, pt demonstrated aspiration x1 on slight amount of premature spillage of thin liquid bolus(when pt threw her head back to swallow); "I didn't like the taste of it". This behavior resulted in trace-min aspiration just below the vocal cords -- pt was able to clear the majority of the aspirated material w/ cough. Pt sensed the aspiration and initiated a cough response   Cervical Esophageal phase revealed apparent prominent CP muscle w/ min posterior protrusion into the Esophageal space -- this did not appear to impede bolus motility or clearing through the UES or Cervical Esophagus.   PLAN/RECOMMENDATIONS:  A. Diet: regular diet w/ Cut meats; moistened foods for ease of mastication and clearing; thin liquids VIA CUP using SINGLE, SMALL sips SLOWLY.  Pills WHOLE in a PUREE for safer swallowing.   B. Swallowing Precautions: general aspiration precautions including reducing distractions during meals, less talking  C. Recommended consultation to: n/a   D. Therapy recommendations: no immediate skilled ST services  unless education is needed for follow through w/ general aspiration precautions. Recommend monitor for any decline in pulmonary status or other negative sequelae from possible aspiration and f/u w/ PCP to discuss further.  E. Results and recommendations were discussed w/ pt; practiced aspiration precautions of single, small sips slowly via cup. Video viewed w/ pt. Handouts of general aspiration precautions sent to Group Home, Caregivers.       Oropharyngeal dysphagia  Vocal cord paralysis - Plan: DG SWALLOW FUNC SPEECH PATH, DG SWALLOW FUNC SPEECH PATH        Problem List Patient Active  Problem List   Diagnosis Date Noted   Abnormal urinalysis    Spastic hemiplegic cerebral palsy (HCC)    Chest pain    Abnormality of gait    History of DVT (deep vein thrombosis)    Post-operative pain    Spastic hemiplegia affecting nondominant side (HCC)    Leukocytosis    Acute blood loss anemia    Glaucoma    Uveitis    Status post total replacement of right hip    Drug-induced osteonecrosis of femur, right (HCC) 01/18/2017   Cerebral palsy (HCC) 01/04/2017   Schizophrenia (HCC) 01/04/2017   Avascular necrosis of bone of hip, right (HCC) 01/04/2017   Hx of pulmonary embolus 01/04/2017          Hannah Som, MS, CCC-SLP Speech Language Pathologist Rehab Services; [facility] [phone] ([address] Hannah Kennedy, CCC-SLP 12/22/2021, 2:27 PM  [facility] DIAGNOSTIC RADIOLOGY [address] [place], [place], [address] Phone: [phone]   Fax:     Name: Hannah Kennedy MRN: [id number] Date of Birth: [birth year]

## 2022-01-21 ENCOUNTER — Ambulatory Visit
Admission: RE | Admit: 2022-01-21 | Discharge: 2022-01-21 | Disposition: A | Discharge status: Home / Self Care | Payer: Medicaid Other | Source: Ambulatory Visit | Attending: Otolaryngology | Admitting: Otolaryngology

## 2022-01-21 ENCOUNTER — Other Ambulatory Visit: Payer: Self-pay

## 2022-01-21 DIAGNOSIS — J3800 Paralysis of vocal cords and larynx, unspecified: Secondary | ICD-10-CM | POA: Diagnosis present

## 2022-01-21 MED ORDER — IOHEXOL 300 MG/ML  SOLN
75.0000 mL | Freq: Once | INTRAMUSCULAR | Status: AC | PRN
  Administered 2022-01-21: 75 mL via INTRAVENOUS

## 2022-01-29 ENCOUNTER — Other Ambulatory Visit: Payer: Self-pay | Admitting: Otolaryngology

## 2022-01-29 DIAGNOSIS — J3800 Paralysis of vocal cords and larynx, unspecified: Secondary | ICD-10-CM

## 2022-02-15 ENCOUNTER — Ambulatory Visit
Admission: RE | Admit: 2022-02-15 | Discharge: 2022-02-15 | Disposition: A | Discharge status: Home / Self Care | Payer: Medicaid Other | Source: Ambulatory Visit | Attending: Otolaryngology | Admitting: Otolaryngology

## 2022-02-15 DIAGNOSIS — J3800 Paralysis of vocal cords and larynx, unspecified: Secondary | ICD-10-CM

## 2022-05-07 ENCOUNTER — Emergency Department
Admission: EM | Admit: 2022-05-07 | Discharge: 2022-05-07 | Disposition: A | Discharge status: Home / Self Care | Payer: Medicaid Other | Attending: Emergency Medicine | Admitting: Emergency Medicine

## 2022-05-07 ENCOUNTER — Other Ambulatory Visit: Payer: Self-pay

## 2022-05-07 DIAGNOSIS — R799 Abnormal finding of blood chemistry, unspecified: Secondary | ICD-10-CM | POA: Diagnosis present

## 2022-05-07 DIAGNOSIS — R899 Unspecified abnormal finding in specimens from other organs, systems and tissues: Secondary | ICD-10-CM

## 2022-05-07 LAB — COMPREHENSIVE METABOLIC PANEL
ALT: 33 U/L (ref 0–44)
AST: 30 U/L (ref 15–41)
Albumin: 4.3 g/dL (ref 3.5–5.0)
Alkaline Phosphatase: 65 U/L (ref 38–126)
Anion gap: 7 (ref 5–15)
BUN: 12 mg/dL (ref 6–20)
CO2: 27 mmol/L (ref 22–32)
Calcium: 9.8 mg/dL (ref 8.9–10.3)
Chloride: 105 mmol/L (ref 98–111)
Creatinine, Ser: 0.82 mg/dL (ref 0.44–1.00)
GFR, Estimated: >60 mL/min (ref >60)
Glucose, Bld: 93 mg/dL (ref 70–99)
Potassium: 3.9 mmol/L (ref 3.5–5.1)
Sodium: 139 mmol/L (ref 135–145)
Total Bilirubin: 0.6 mg/dL (ref 0.3–1.2)
Total Protein: 8 g/dL (ref 6.5–8.1)

## 2022-05-07 LAB — CBC WITH DIFFERENTIAL/PLATELET
Abs Immature Granulocytes: 0.03 10*3/uL (ref 0.00–0.07)
Basophils Absolute: 0.1 10*3/uL (ref 0.0–0.1)
Basophils Relative: 1 %
Eosinophils Absolute: 0.1 10*3/uL (ref 0.0–0.5)
Eosinophils Relative: 1 %
HCT: 41.4 % (ref 36.0–46.0)
Hemoglobin: 13.3 g/dL (ref 12.0–15.0)
Immature Granulocytes: 0 %
Lymphocytes Relative: 47 %
Lymphs Abs: 3.7 10*3/uL (ref 0.7–4.0)
MCH: 25.5 pg — ABNORMAL LOW (ref 26.0–34.0)
MCHC: 32.1 g/dL (ref 30.0–36.0)
MCV: 79.5 fL — ABNORMAL LOW (ref 80.0–100.0)
Monocytes Absolute: 0.6 10*3/uL (ref 0.1–1.0)
Monocytes Relative: 8 %
Neutro Abs: 3.4 10*3/uL (ref 1.7–7.7)
Neutrophils Relative %: 43 %
Platelets: 247 10*3/uL (ref 150–400)
RBC: 5.21 MIL/uL — ABNORMAL HIGH (ref 3.87–5.11)
RDW: 14.3 % (ref 11.5–15.5)
WBC: 7.9 10*3/uL (ref 4.0–10.5)
nRBC: 0 % (ref 0.0–0.2)

## 2022-05-07 NOTE — ED Triage Notes (Signed)
Patient coming in from home for abnormal labs, potassium of 7.2, LDH 238. Patient denies other  complaints, reports they were routine labs.

## 2022-05-07 NOTE — Discharge Instructions (Addendum)
We repeated your labs and they were normal here.  Your earlier results were likely related to lab error. your potassium was normal in the emergency department

## 2022-05-07 NOTE — ED Provider Notes (Signed)
   [facility] Provider Note    Event Date/Time   First MD Initiated Contact with Patient 05/07/22 1942     (approximate)   History   Abnormal Labs   HPI  Hannah Kennedy is a 36 y.o. female with a history of cerebral palsy, schizophrenia sent in for evaluation because of abnormal labs.  Patient had screening labs drawn and reportedly had an elevated potassium and was sent to the emergency department.  She feels well and has no complaints     Physical Exam   Triage Vital Signs: ED Triage Vitals  Enc Vitals Group     BP 05/07/22 1907 (!) 135/99     Pulse Rate 05/07/22 1907 87     Resp 05/07/22 1907 16     Temp 05/07/22 1907 97.9 F (36.6 C)     Temp Source 05/07/22 1907 Oral     SpO2 05/07/22 1907 99 %     Weight 05/07/22 1908 72.6 kg (160 lb 0.9 oz)     Height 05/07/22 1908 1.575 m (5\' 2" )     Head Circumference --      Peak Flow --      Pain Score 05/07/22 1907 0     Pain Loc --      Pain Edu? --      Excl. in GC? --     Most recent vital signs: Vitals:   05/07/22 1907  BP: (!) 135/99  Pulse: 87  Resp: 16  Temp: 97.9 F (36.6 C)  SpO2: 99%     General: Awake, no distress.  CV:  Good peripheral perfusion.  Resp:  Normal effort.  Abd:  No distention.  Other:     ED Results / Procedures / Treatments   Labs (all labs ordered are listed, but only abnormal results are displayed) Labs Reviewed  CBC WITH DIFFERENTIAL/PLATELET - Abnormal; Notable for the following components:      Result Value   RBC 5.21 (*)    MCV 79.5 (*)    MCH 25.5 (*)    All other components within normal limits  COMPREHENSIVE METABOLIC PANEL     EKG     RADIOLOGY     PROCEDURES:  Critical Care performed:   Procedures   MEDICATIONS ORDERED IN ED: Medications - No data to display   IMPRESSION / MDM / ASSESSMENT AND PLAN / ED COURSE  I reviewed the triage vital signs and the nursing notes. Patient's presentation is most consistent with  acute complicated illness / injury requiring diagnostic workup.  Patient sent in for elevated potassium as noted above.  Differential includes elevated potassium versus hemolysis, labs were apparently drawn at her group home and then transported so I suspect analysis as patient is very well-appearing  Repeat BMP demonstrates normal potassium, no further work-up necessary at this time        FINAL CLINICAL IMPRESSION(S) / ED DIAGNOSES   Final diagnoses:  Abnormal laboratory test result     Rx / DC Orders   ED Discharge Orders     None        Note:  This document was prepared using Dragon voice recognition software and may include unintentional dictation errors.   05/09/22, MD 05/07/22 2220

## 2022-05-07 NOTE — ED Notes (Addendum)
First Nurse Note:  Pt here with caregiver from Group Home. Pt here for elevated cholesterol level. Pt is A&Ox4 and NAD

## 2023-04-21 ENCOUNTER — Other Ambulatory Visit: Payer: Self-pay | Admitting: Gastroenterology

## 2023-04-21 DIAGNOSIS — R053 Chronic cough: Secondary | ICD-10-CM

## 2023-04-21 DIAGNOSIS — K219 Gastro-esophageal reflux disease without esophagitis: Secondary | ICD-10-CM

## 2023-04-21 DIAGNOSIS — R1312 Dysphagia, oropharyngeal phase: Secondary | ICD-10-CM

## 2023-04-26 ENCOUNTER — Encounter: Payer: Self-pay | Admitting: Radiology

## 2023-05-02 ENCOUNTER — Ambulatory Visit
Admission: RE | Admit: 2023-05-02 | Discharge: 2023-05-02 | Disposition: A | Discharge status: Home / Self Care | Payer: Medicaid Other | Source: Ambulatory Visit | Attending: Gastroenterology | Admitting: Gastroenterology

## 2023-05-02 ENCOUNTER — Other Ambulatory Visit: Payer: Self-pay | Admitting: Gastroenterology

## 2023-05-02 DIAGNOSIS — R1312 Dysphagia, oropharyngeal phase: Secondary | ICD-10-CM | POA: Insufficient documentation

## 2023-05-02 DIAGNOSIS — R053 Chronic cough: Secondary | ICD-10-CM

## 2023-05-02 DIAGNOSIS — K219 Gastro-esophageal reflux disease without esophagitis: Secondary | ICD-10-CM | POA: Insufficient documentation
